# Patient Record
Sex: Female | Born: 1935
Health system: Southern US, Community
[De-identification: ages and names within clinical notes are randomized; demographics above are authoritative.]

## PROBLEM LIST (undated history)

## (undated) DIAGNOSIS — I82403 Acute embolism and thrombosis of unspecified deep veins of lower extremity, bilateral: Secondary | ICD-10-CM

## (undated) DIAGNOSIS — I2699 Other pulmonary embolism without acute cor pulmonale: Secondary | ICD-10-CM

## (undated) DIAGNOSIS — I4891 Unspecified atrial fibrillation: Secondary | ICD-10-CM

## (undated) DIAGNOSIS — E119 Type 2 diabetes mellitus without complications: Secondary | ICD-10-CM

## (undated) DIAGNOSIS — F323 Major depressive disorder, single episode, severe with psychotic features: Secondary | ICD-10-CM

## (undated) DIAGNOSIS — I4892 Unspecified atrial flutter: Secondary | ICD-10-CM

## (undated) DIAGNOSIS — F0390 Unspecified dementia without behavioral disturbance: Secondary | ICD-10-CM

## (undated) DIAGNOSIS — N189 Chronic kidney disease, unspecified: Secondary | ICD-10-CM

## (undated) DIAGNOSIS — I10 Essential (primary) hypertension: Secondary | ICD-10-CM

## (undated) DIAGNOSIS — F0150 Vascular dementia without behavioral disturbance: Secondary | ICD-10-CM

## (undated) HISTORY — PX: CHOLECYSTECTOMY: SHX55

## (undated) HISTORY — DX: Major depressive disorder, single episode, severe with psychotic features: F32.3

## (undated) HISTORY — DX: Unspecified dementia, unspecified severity, without behavioral disturbance, psychotic disturbance, mood disturbance, and anxiety: F03.90

---
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
## 2000-01-14 ENCOUNTER — Encounter (INDEPENDENT_AMBULATORY_CARE_PROVIDER_SITE_OTHER): Payer: Self-pay | Admitting: Specialist

## 2000-01-14 ENCOUNTER — Observation Stay (HOSPITAL_COMMUNITY): Admission: EM | Admit: 2000-01-14 | Discharge: 2000-01-15 | Payer: Self-pay

## 2000-01-14 ENCOUNTER — Encounter: Payer: Self-pay | Admitting: Emergency Medicine

## 2000-12-13 ENCOUNTER — Encounter (INDEPENDENT_AMBULATORY_CARE_PROVIDER_SITE_OTHER): Payer: Self-pay

## 2000-12-13 ENCOUNTER — Ambulatory Visit (HOSPITAL_COMMUNITY): Admission: RE | Admit: 2000-12-13 | Discharge: 2000-12-13 | Payer: Self-pay | Admitting: Gastroenterology

## 2001-06-06 ENCOUNTER — Other Ambulatory Visit: Admission: RE | Admit: 2001-06-06 | Discharge: 2001-06-06 | Payer: Self-pay | Admitting: Family Medicine

## 2004-05-03 ENCOUNTER — Ambulatory Visit (HOSPITAL_COMMUNITY): Admission: RE | Admit: 2004-05-03 | Discharge: 2004-05-03 | Payer: Self-pay | Admitting: Gastroenterology

## 2004-05-03 ENCOUNTER — Encounter (INDEPENDENT_AMBULATORY_CARE_PROVIDER_SITE_OTHER): Payer: Self-pay | Admitting: Specialist

## 2004-06-22 ENCOUNTER — Other Ambulatory Visit: Admission: RE | Admit: 2004-06-22 | Discharge: 2004-06-22 | Payer: Self-pay | Admitting: Family Medicine

## 2006-08-08 ENCOUNTER — Other Ambulatory Visit: Admission: RE | Admit: 2006-08-08 | Discharge: 2006-08-08 | Payer: Self-pay | Admitting: Family Medicine

## 2008-08-17 ENCOUNTER — Other Ambulatory Visit: Admission: RE | Admit: 2008-08-17 | Discharge: 2008-08-17 | Payer: Self-pay | Admitting: Family Medicine

## 2011-05-05 NOTE — Op Note (Signed)
NAME:  Catherine Gonzales, Catherine Gonzales                         ACCOUNT NO.:  [id number]   MEDICAL RECORD NO.:  [id number]                   PATIENT TYPE:  AMB   LOCATION:  ENDO                                 FACILITY:  [facility]   PHYSICIAN:  Petra Kuba, M.D.                 DATE OF BIRTH:  1934-02-05   DATE OF PROCEDURE:  DATE OF DISCHARGE:  05/03/2004                                 OPERATIVE REPORT   PROCEDURE:  Colonoscopy with polypectomy.   INDICATION:  Family history of colon cancer.  Person history of colon  polyps.  Due for repeat screening.  Consent was signed after risks,  benefits, methods, options thoroughly discussed in the office.   MEDICINES USED:  1. Demerol 90.  2. Versed 9.   DESCRIPTION OF PROCEDURE:  Rectal inspection was pertinent for external  hemorrhoids, small.  Digital exam was negative.  The video colonoscope was  inserted and with some difficulty due to a tortuous colon, was able to be  advanced to the cecum.  This did require rolling her on her back and various  abdominal pressures.  On insertion, in the mid transverse, a small polyp was  seen which was cold biopsied on insertion.  No other abnormalities were  seen, but there were left-sided diverticula.  The cecum was identified by  the appendiceal orifice and the ileocecal valve.  A tiny polyp was seen just  by the __________ which was cold biopsied x 1 and put in the same container.  The scope was further withdrawn.  In the proximal level of the hepatic  flexure, a tiny to small polyp was seen and was hot biopsied x 1 and put in  the same container.  The scope was withdrawn back to the polyp seen on  insertion, and we went ahead and snared that, electrocautery applied.  The  polyp was suctioned through the scope and collected in the trap and put in  the same container.  The scope was further withdrawn.  Another distal tiny  transverse polyp was seen and was hot biopsied x 1.  The scope was slowly  withdrawn.  The  prep was adequate.  There was some liquid stool that  required washing and suctioning.  On slow withdrawal through the colon,  however, no additional findings were seen but the early left-sided  diverticula.  Anorectal pull-through and retroflexion confirmed some small  hemorrhoids.  The scope was reinserted a short ways up the left side of the  colon; air was suctioned and scope removed.  The patient tolerated the  procedure well.  There was no obvious immediate complication.   ENDOSCOPIC DIAGNOSES:  1. Internal-external hemorrhoids.  2. Left-sided early diverticula.  3. Cecal tiny polyp cold biopsy.  4. Hepatic flexure and distal transverse small polyps hot biopsied.  5. Small transverse polyp, cold biopsied on insertion, snared on withdrawal.  6. Otherwise, within normal limits to  the cecum.   PLAN:  1. Await pathology to determine future colonic screening.  2. Happy to see back p.r.n.  3. Otherwise, return care to Dr. Arvilla Market for the customary health care     maintenance to include yearly rectals and guaiacs.                                               Petra Kuba, M.D.    MEM/MEDQ  D:  05/03/2004  T:  05/03/2004  Job:  [id number]   cc:   Donia Guiles, M.D.  [address] [place]  [place] [address]  Fax: [phone]

## 2011-05-05 NOTE — Procedures (Signed)
[facility]  Patient:    Catherine Gonzales, Catherine Gonzales                        MRN: [id number] Proc. Date: 12/13/00 Attending:  Petra Kuba, M.D.                           Procedure Report  PROCEDURE:  Colonoscopy and polypectomy.  ENDOSCOPIST:  Petra Kuba, M.D.  INDICATIONS:  Family history of colon cancer, due for colonic screening.  INFORMED CONSENT:  Consent was signed after risks, benefits, methods and options were thoroughly discussed in the office.  MEDICATIONS USED:  Demerol 100 mg, Versed 9 mg.  DESCRIPTION OF PROCEDURE:  Rectal examination was pertinent for external hemorrhoids.  Digital exam was negative.  The video colonoscope was inserted and fairly easily advanced around the colon to the cecum.  This did require some abdominal pressure but no position changes.  The cecum was identified by the appendiceal orifice and the ileocecal valve.  No significant abnormality was seen on insertion.  The scope was slowly withdrawn.  The prep was adequate.  There was some liquid stool that required washing and suctioning. Upon slow withdrawal through the colon, the cecum was normal.  In the mid-descending to the hepatic flexure, a 4 mm polyp was seen, and snare electrocautery applied and the polyp was suctioned through the scope and collected in the container.  There was another 2 mm polyp just proximal to this which was hot biopsied x 1 and put in the same container.  The scope was further withdrawn.  The sigmoid was tortuous.  There were two tiny proximal sigmoid polyps seen.  One was tiny and cold biopsied.  One was hot biopsied. We decided based on their tiny appearance and non-worrisome appearance to put them with the other polyps.  The scope was further withdrawn.  No other abnormalities were seen as we withdrew back to the rectum except for some hemorrhoids.  Retroflexion confirmed the hemorrhoids.  The scope was inserted a short way up the sigmoid.  Air  was removed.  The scope removed.  The patient tolerated the procedure well.  There were no obvious immediate complications.  ENDOSCOPIC DIAGNOSES: 1. Internal and external hemorrhoids. 2. Tortuous sigmoid. 3. Four polyps, one hot, one cold in the proximal sigmoid, one snared    and one hot in the ascending and hepatic flexure. 4. Otherwise within normal limits to the cecum.  PLAN: 1. Await pathology to determine future colonic screening. 2. Otherwise routine post polypectomy instructions. 3. Yearly rectal and guaiacs by Dr. Arvilla Market. 4. I would be happy to see back sooner p.r.n. DD:  12/13/00 TD:  12/13/00 Job: [id number]

## 2012-03-21 ENCOUNTER — Other Ambulatory Visit: Payer: Self-pay | Admitting: Family Medicine

## 2012-03-21 ENCOUNTER — Ambulatory Visit
Admission: RE | Admit: 2012-03-21 | Discharge: 2012-03-21 | Disposition: A | Discharge status: Home / Self Care | Payer: Medicare Other | Source: Ambulatory Visit | Attending: Family Medicine | Admitting: Family Medicine

## 2012-03-21 DIAGNOSIS — M25569 Pain in unspecified knee: Secondary | ICD-10-CM | POA: Diagnosis not present

## 2012-03-21 DIAGNOSIS — E119 Type 2 diabetes mellitus without complications: Secondary | ICD-10-CM | POA: Diagnosis not present

## 2012-03-21 DIAGNOSIS — M1710 Unilateral primary osteoarthritis, unspecified knee: Secondary | ICD-10-CM | POA: Diagnosis not present

## 2012-03-21 DIAGNOSIS — E782 Mixed hyperlipidemia: Secondary | ICD-10-CM | POA: Diagnosis not present

## 2012-03-21 DIAGNOSIS — I10 Essential (primary) hypertension: Secondary | ICD-10-CM | POA: Diagnosis not present

## 2012-04-30 DIAGNOSIS — H2510 Age-related nuclear cataract, unspecified eye: Secondary | ICD-10-CM | POA: Diagnosis not present

## 2012-04-30 DIAGNOSIS — H40019 Open angle with borderline findings, low risk, unspecified eye: Secondary | ICD-10-CM | POA: Diagnosis not present

## 2012-08-07 ENCOUNTER — Other Ambulatory Visit: Payer: Self-pay | Admitting: Gastroenterology

## 2012-08-07 DIAGNOSIS — K5730 Diverticulosis of large intestine without perforation or abscess without bleeding: Secondary | ICD-10-CM | POA: Diagnosis not present

## 2012-08-07 DIAGNOSIS — Z86010 Personal history of colonic polyps: Secondary | ICD-10-CM | POA: Diagnosis not present

## 2012-08-07 DIAGNOSIS — Z09 Encounter for follow-up examination after completed treatment for conditions other than malignant neoplasm: Secondary | ICD-10-CM | POA: Diagnosis not present

## 2012-08-07 DIAGNOSIS — D126 Benign neoplasm of colon, unspecified: Secondary | ICD-10-CM | POA: Diagnosis not present

## 2012-08-07 DIAGNOSIS — Z800 Family history of malignant neoplasm of digestive organs: Secondary | ICD-10-CM | POA: Diagnosis not present

## 2012-09-26 DIAGNOSIS — Z1231 Encounter for screening mammogram for malignant neoplasm of breast: Secondary | ICD-10-CM | POA: Diagnosis not present

## 2012-10-30 DIAGNOSIS — E782 Mixed hyperlipidemia: Secondary | ICD-10-CM | POA: Diagnosis not present

## 2012-10-30 DIAGNOSIS — Z0000 Encounter for general adult medical examination without abnormal findings: Secondary | ICD-10-CM | POA: Diagnosis not present

## 2012-10-30 DIAGNOSIS — I10 Essential (primary) hypertension: Secondary | ICD-10-CM | POA: Diagnosis not present

## 2012-11-19 DIAGNOSIS — L820 Inflamed seborrheic keratosis: Secondary | ICD-10-CM | POA: Diagnosis not present

## 2012-11-19 DIAGNOSIS — L570 Actinic keratosis: Secondary | ICD-10-CM | POA: Diagnosis not present

## 2013-01-27 DIAGNOSIS — M79609 Pain in unspecified limb: Secondary | ICD-10-CM | POA: Diagnosis not present

## 2013-01-27 DIAGNOSIS — M1710 Unilateral primary osteoarthritis, unspecified knee: Secondary | ICD-10-CM | POA: Diagnosis not present

## 2013-02-28 DIAGNOSIS — M1710 Unilateral primary osteoarthritis, unspecified knee: Secondary | ICD-10-CM | POA: Diagnosis not present

## 2013-04-24 DIAGNOSIS — I10 Essential (primary) hypertension: Secondary | ICD-10-CM | POA: Diagnosis not present

## 2013-04-24 DIAGNOSIS — E119 Type 2 diabetes mellitus without complications: Secondary | ICD-10-CM | POA: Diagnosis not present

## 2013-04-24 DIAGNOSIS — Z6839 Body mass index (BMI) 39.0-39.9, adult: Secondary | ICD-10-CM | POA: Diagnosis not present

## 2013-04-24 DIAGNOSIS — R05 Cough: Secondary | ICD-10-CM | POA: Diagnosis not present

## 2013-04-24 DIAGNOSIS — E782 Mixed hyperlipidemia: Secondary | ICD-10-CM | POA: Diagnosis not present

## 2013-05-01 DIAGNOSIS — H40019 Open angle with borderline findings, low risk, unspecified eye: Secondary | ICD-10-CM | POA: Diagnosis not present

## 2013-05-01 DIAGNOSIS — H2510 Age-related nuclear cataract, unspecified eye: Secondary | ICD-10-CM | POA: Diagnosis not present

## 2013-08-25 IMAGING — CR DG KNEE 1-2V*L*
2 series · 2 of 2 positions shown · non-contrast
Comparison: None.

CLINICAL DATA: Left lateral knee pain, no injury

LEFT KNEE - 1-2 VIEW

[view not recorded (1 of 2)]
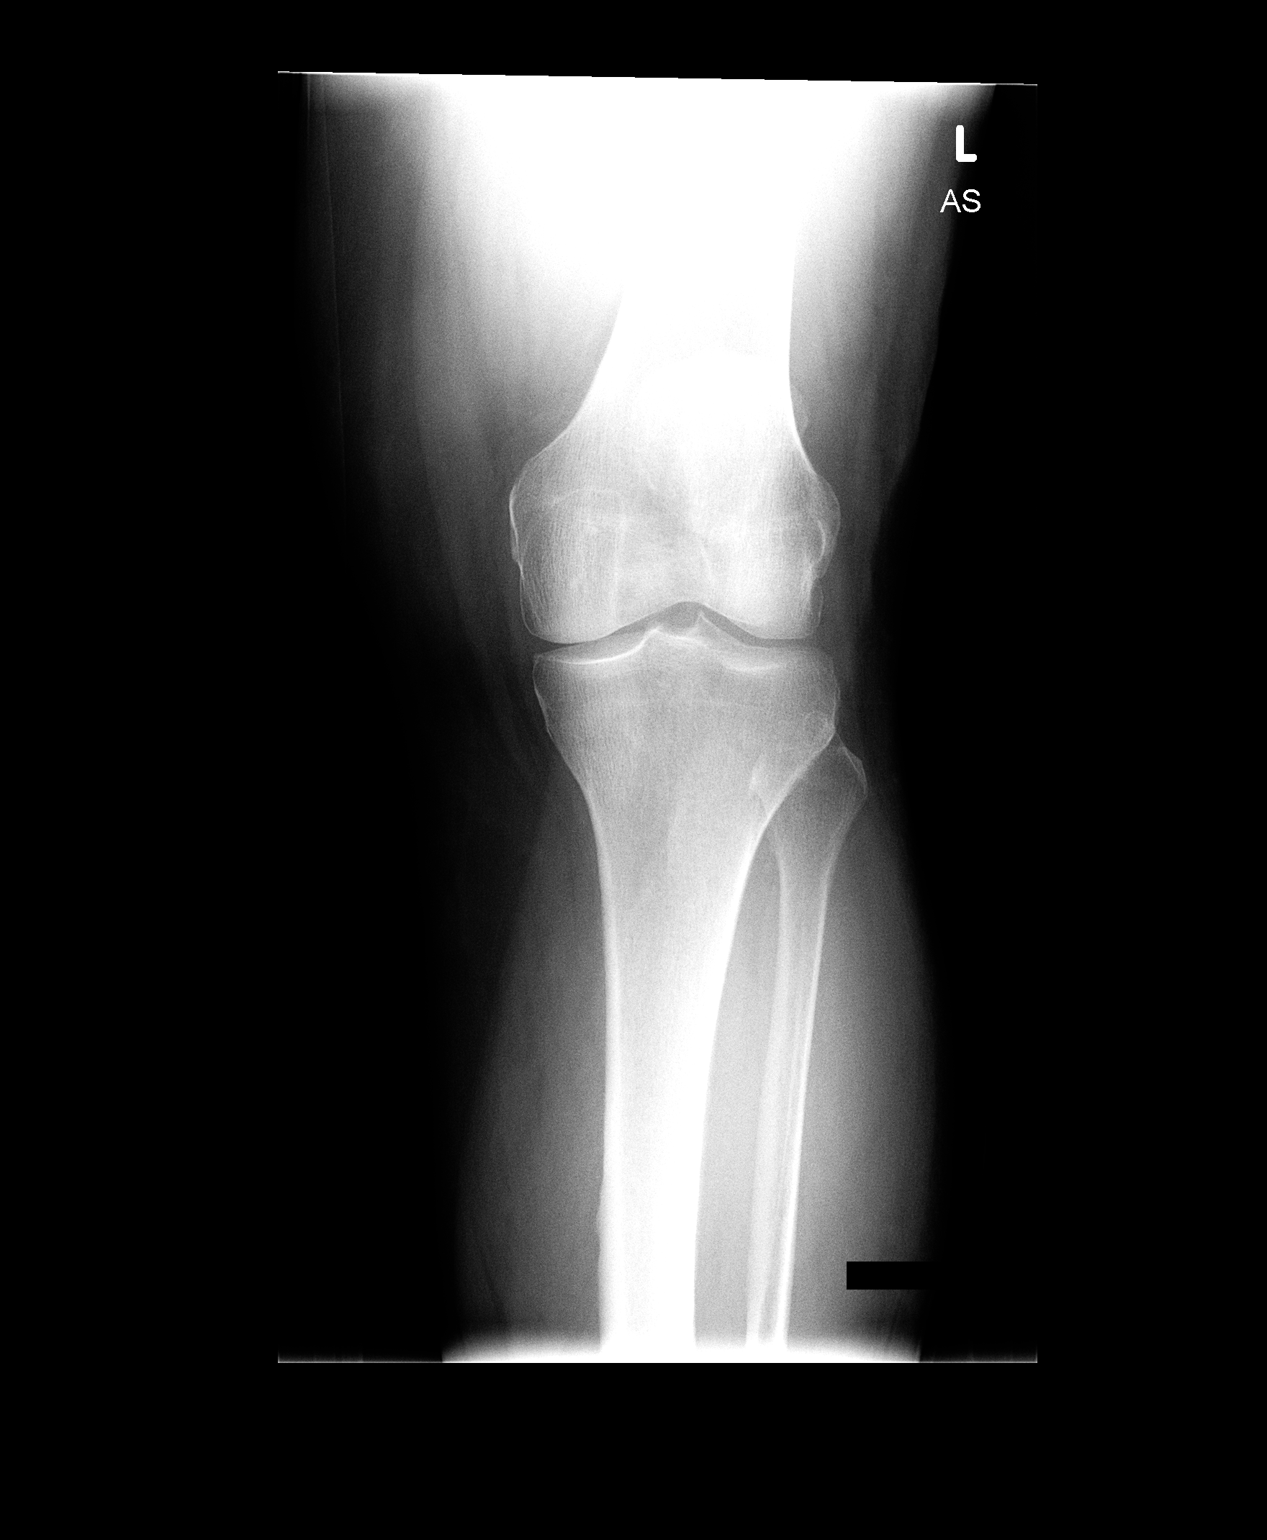

[view not recorded (2 of 2)]
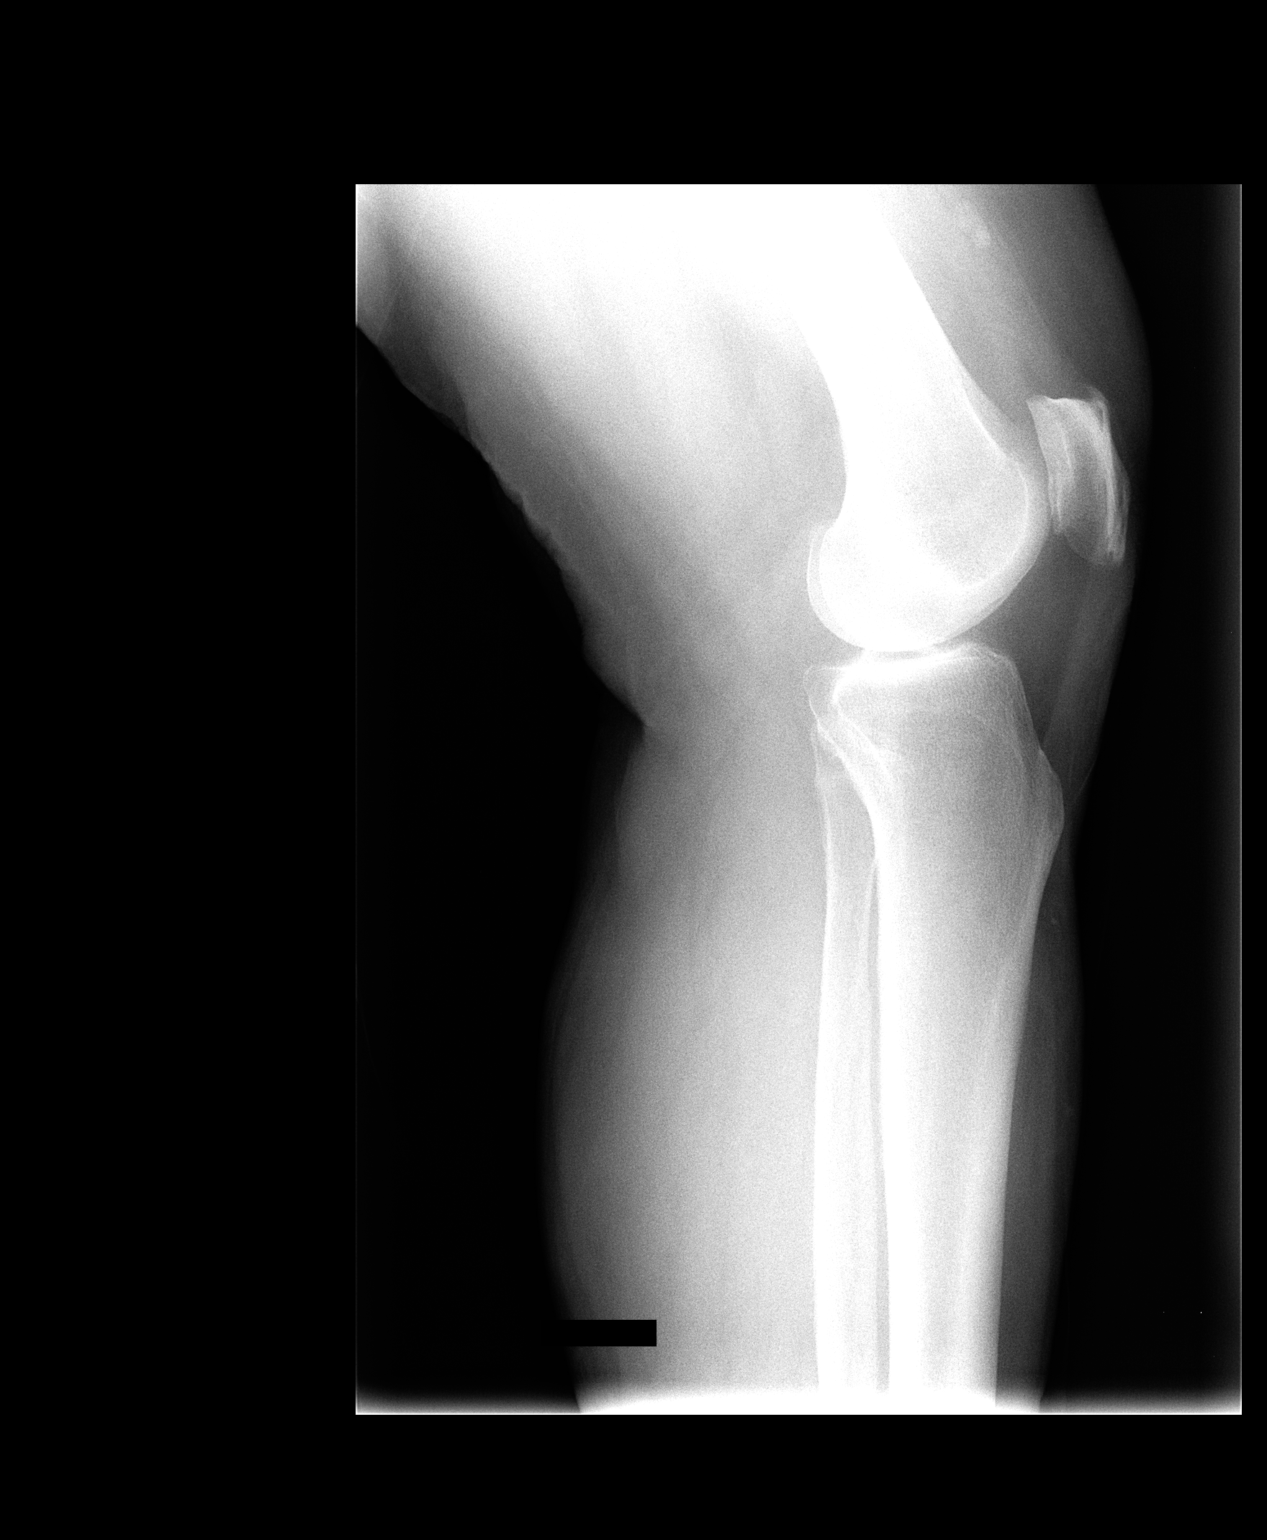

[2 of 2 positions shown; findings below may reference images not displayed]

FINDINGS: The knee joint spaces are relatively normal for age with
only slight narrowing medially.  No acute fracture is seen.  No
joint effusion is noted.
IMPRESSION: Minimal degenerative change for age.  No acute abnormality.

## 2013-10-17 DIAGNOSIS — Z1231 Encounter for screening mammogram for malignant neoplasm of breast: Secondary | ICD-10-CM | POA: Diagnosis not present

## 2013-10-23 DIAGNOSIS — R928 Other abnormal and inconclusive findings on diagnostic imaging of breast: Secondary | ICD-10-CM | POA: Diagnosis not present

## 2014-02-27 DIAGNOSIS — Z23 Encounter for immunization: Secondary | ICD-10-CM | POA: Diagnosis not present

## 2014-02-27 DIAGNOSIS — Z6838 Body mass index (BMI) 38.0-38.9, adult: Secondary | ICD-10-CM | POA: Diagnosis not present

## 2014-02-27 DIAGNOSIS — E782 Mixed hyperlipidemia: Secondary | ICD-10-CM | POA: Diagnosis not present

## 2014-02-27 DIAGNOSIS — Z0000 Encounter for general adult medical examination without abnormal findings: Secondary | ICD-10-CM | POA: Diagnosis not present

## 2014-02-27 DIAGNOSIS — E1129 Type 2 diabetes mellitus with other diabetic kidney complication: Secondary | ICD-10-CM | POA: Diagnosis not present

## 2014-02-27 DIAGNOSIS — Z1331 Encounter for screening for depression: Secondary | ICD-10-CM | POA: Diagnosis not present

## 2014-02-27 DIAGNOSIS — N183 Chronic kidney disease, stage 3 unspecified: Secondary | ICD-10-CM | POA: Diagnosis not present

## 2014-02-27 DIAGNOSIS — I129 Hypertensive chronic kidney disease with stage 1 through stage 4 chronic kidney disease, or unspecified chronic kidney disease: Secondary | ICD-10-CM | POA: Diagnosis not present

## 2014-05-05 DIAGNOSIS — E119 Type 2 diabetes mellitus without complications: Secondary | ICD-10-CM | POA: Diagnosis not present

## 2014-05-05 DIAGNOSIS — H40019 Open angle with borderline findings, low risk, unspecified eye: Secondary | ICD-10-CM | POA: Diagnosis not present

## 2014-05-05 DIAGNOSIS — H2510 Age-related nuclear cataract, unspecified eye: Secondary | ICD-10-CM | POA: Diagnosis not present

## 2014-05-05 DIAGNOSIS — H4011X0 Primary open-angle glaucoma, stage unspecified: Secondary | ICD-10-CM | POA: Diagnosis not present

## 2014-05-05 DIAGNOSIS — H02839 Dermatochalasis of unspecified eye, unspecified eyelid: Secondary | ICD-10-CM | POA: Diagnosis not present

## 2014-06-10 DIAGNOSIS — M1710 Unilateral primary osteoarthritis, unspecified knee: Secondary | ICD-10-CM | POA: Diagnosis not present

## 2014-09-02 DIAGNOSIS — Z23 Encounter for immunization: Secondary | ICD-10-CM | POA: Diagnosis not present

## 2014-09-02 DIAGNOSIS — E119 Type 2 diabetes mellitus without complications: Secondary | ICD-10-CM | POA: Diagnosis not present

## 2014-09-02 DIAGNOSIS — E782 Mixed hyperlipidemia: Secondary | ICD-10-CM | POA: Diagnosis not present

## 2014-09-02 DIAGNOSIS — I10 Essential (primary) hypertension: Secondary | ICD-10-CM | POA: Diagnosis not present

## 2014-09-02 DIAGNOSIS — Z6838 Body mass index (BMI) 38.0-38.9, adult: Secondary | ICD-10-CM | POA: Diagnosis not present

## 2014-10-02 DIAGNOSIS — H40011 Open angle with borderline findings, low risk, right eye: Secondary | ICD-10-CM | POA: Diagnosis not present

## 2014-10-02 DIAGNOSIS — H2513 Age-related nuclear cataract, bilateral: Secondary | ICD-10-CM | POA: Diagnosis not present

## 2014-10-02 DIAGNOSIS — H02834 Dermatochalasis of left upper eyelid: Secondary | ICD-10-CM | POA: Diagnosis not present

## 2014-10-02 DIAGNOSIS — H4011X2 Primary open-angle glaucoma, moderate stage: Secondary | ICD-10-CM | POA: Diagnosis not present

## 2014-10-02 DIAGNOSIS — H02831 Dermatochalasis of right upper eyelid: Secondary | ICD-10-CM | POA: Diagnosis not present

## 2014-10-22 DIAGNOSIS — Z1231 Encounter for screening mammogram for malignant neoplasm of breast: Secondary | ICD-10-CM | POA: Diagnosis not present

## 2015-01-05 DIAGNOSIS — J069 Acute upper respiratory infection, unspecified: Secondary | ICD-10-CM | POA: Diagnosis not present

## 2015-03-10 DIAGNOSIS — Z6841 Body Mass Index (BMI) 40.0 and over, adult: Secondary | ICD-10-CM | POA: Diagnosis not present

## 2015-03-10 DIAGNOSIS — E782 Mixed hyperlipidemia: Secondary | ICD-10-CM | POA: Diagnosis not present

## 2015-03-10 DIAGNOSIS — I129 Hypertensive chronic kidney disease with stage 1 through stage 4 chronic kidney disease, or unspecified chronic kidney disease: Secondary | ICD-10-CM | POA: Diagnosis not present

## 2015-03-10 DIAGNOSIS — N183 Chronic kidney disease, stage 3 (moderate): Secondary | ICD-10-CM | POA: Diagnosis not present

## 2015-03-10 DIAGNOSIS — Z0001 Encounter for general adult medical examination with abnormal findings: Secondary | ICD-10-CM | POA: Diagnosis not present

## 2015-03-10 DIAGNOSIS — E1122 Type 2 diabetes mellitus with diabetic chronic kidney disease: Secondary | ICD-10-CM | POA: Diagnosis not present

## 2015-03-26 DIAGNOSIS — J209 Acute bronchitis, unspecified: Secondary | ICD-10-CM | POA: Diagnosis not present

## 2015-04-09 DIAGNOSIS — H02834 Dermatochalasis of left upper eyelid: Secondary | ICD-10-CM | POA: Diagnosis not present

## 2015-04-09 DIAGNOSIS — E119 Type 2 diabetes mellitus without complications: Secondary | ICD-10-CM | POA: Diagnosis not present

## 2015-04-09 DIAGNOSIS — H40011 Open angle with borderline findings, low risk, right eye: Secondary | ICD-10-CM | POA: Diagnosis not present

## 2015-04-09 DIAGNOSIS — H4011X2 Primary open-angle glaucoma, moderate stage: Secondary | ICD-10-CM | POA: Diagnosis not present

## 2015-04-09 DIAGNOSIS — H2513 Age-related nuclear cataract, bilateral: Secondary | ICD-10-CM | POA: Diagnosis not present

## 2015-04-09 DIAGNOSIS — H02831 Dermatochalasis of right upper eyelid: Secondary | ICD-10-CM | POA: Diagnosis not present

## 2015-06-15 DIAGNOSIS — N183 Chronic kidney disease, stage 3 (moderate): Secondary | ICD-10-CM | POA: Diagnosis not present

## 2015-06-15 DIAGNOSIS — Z6841 Body Mass Index (BMI) 40.0 and over, adult: Secondary | ICD-10-CM | POA: Diagnosis not present

## 2015-06-15 DIAGNOSIS — I129 Hypertensive chronic kidney disease with stage 1 through stage 4 chronic kidney disease, or unspecified chronic kidney disease: Secondary | ICD-10-CM | POA: Diagnosis not present

## 2015-06-15 DIAGNOSIS — E1122 Type 2 diabetes mellitus with diabetic chronic kidney disease: Secondary | ICD-10-CM | POA: Diagnosis not present

## 2015-06-15 DIAGNOSIS — E782 Mixed hyperlipidemia: Secondary | ICD-10-CM | POA: Diagnosis not present

## 2015-08-24 DIAGNOSIS — N183 Chronic kidney disease, stage 3 (moderate): Secondary | ICD-10-CM | POA: Diagnosis not present

## 2015-08-24 DIAGNOSIS — I129 Hypertensive chronic kidney disease with stage 1 through stage 4 chronic kidney disease, or unspecified chronic kidney disease: Secondary | ICD-10-CM | POA: Diagnosis not present

## 2015-09-16 DIAGNOSIS — Z23 Encounter for immunization: Secondary | ICD-10-CM | POA: Diagnosis not present

## 2015-09-16 DIAGNOSIS — Z6839 Body mass index (BMI) 39.0-39.9, adult: Secondary | ICD-10-CM | POA: Diagnosis not present

## 2015-09-16 DIAGNOSIS — E1122 Type 2 diabetes mellitus with diabetic chronic kidney disease: Secondary | ICD-10-CM | POA: Diagnosis not present

## 2015-09-16 DIAGNOSIS — N183 Chronic kidney disease, stage 3 (moderate): Secondary | ICD-10-CM | POA: Diagnosis not present

## 2015-09-16 DIAGNOSIS — E782 Mixed hyperlipidemia: Secondary | ICD-10-CM | POA: Diagnosis not present

## 2015-09-16 DIAGNOSIS — I129 Hypertensive chronic kidney disease with stage 1 through stage 4 chronic kidney disease, or unspecified chronic kidney disease: Secondary | ICD-10-CM | POA: Diagnosis not present

## 2015-10-13 DIAGNOSIS — H40051 Ocular hypertension, right eye: Secondary | ICD-10-CM | POA: Diagnosis not present

## 2015-10-13 DIAGNOSIS — H2513 Age-related nuclear cataract, bilateral: Secondary | ICD-10-CM | POA: Diagnosis not present

## 2015-10-13 DIAGNOSIS — H40011 Open angle with borderline findings, low risk, right eye: Secondary | ICD-10-CM | POA: Diagnosis not present

## 2015-10-13 DIAGNOSIS — H02831 Dermatochalasis of right upper eyelid: Secondary | ICD-10-CM | POA: Diagnosis not present

## 2015-10-13 DIAGNOSIS — H401122 Primary open-angle glaucoma, left eye, moderate stage: Secondary | ICD-10-CM | POA: Diagnosis not present

## 2015-10-13 DIAGNOSIS — H02834 Dermatochalasis of left upper eyelid: Secondary | ICD-10-CM | POA: Diagnosis not present

## 2015-12-15 DIAGNOSIS — Z1231 Encounter for screening mammogram for malignant neoplasm of breast: Secondary | ICD-10-CM | POA: Diagnosis not present

## 2016-01-19 DIAGNOSIS — N183 Chronic kidney disease, stage 3 (moderate): Secondary | ICD-10-CM | POA: Diagnosis not present

## 2016-01-19 DIAGNOSIS — E1122 Type 2 diabetes mellitus with diabetic chronic kidney disease: Secondary | ICD-10-CM | POA: Diagnosis not present

## 2016-01-19 DIAGNOSIS — Z6839 Body mass index (BMI) 39.0-39.9, adult: Secondary | ICD-10-CM | POA: Diagnosis not present

## 2016-01-19 DIAGNOSIS — E782 Mixed hyperlipidemia: Secondary | ICD-10-CM | POA: Diagnosis not present

## 2016-01-19 DIAGNOSIS — I129 Hypertensive chronic kidney disease with stage 1 through stage 4 chronic kidney disease, or unspecified chronic kidney disease: Secondary | ICD-10-CM | POA: Diagnosis not present

## 2016-01-19 DIAGNOSIS — Z7984 Long term (current) use of oral hypoglycemic drugs: Secondary | ICD-10-CM | POA: Diagnosis not present

## 2016-05-04 DIAGNOSIS — E1122 Type 2 diabetes mellitus with diabetic chronic kidney disease: Secondary | ICD-10-CM | POA: Diagnosis not present

## 2016-05-04 DIAGNOSIS — E782 Mixed hyperlipidemia: Secondary | ICD-10-CM | POA: Diagnosis not present

## 2016-05-04 DIAGNOSIS — N183 Chronic kidney disease, stage 3 (moderate): Secondary | ICD-10-CM | POA: Diagnosis not present

## 2016-05-04 DIAGNOSIS — Z6839 Body mass index (BMI) 39.0-39.9, adult: Secondary | ICD-10-CM | POA: Diagnosis not present

## 2016-05-04 DIAGNOSIS — I129 Hypertensive chronic kidney disease with stage 1 through stage 4 chronic kidney disease, or unspecified chronic kidney disease: Secondary | ICD-10-CM | POA: Diagnosis not present

## 2016-05-04 DIAGNOSIS — Z0000 Encounter for general adult medical examination without abnormal findings: Secondary | ICD-10-CM | POA: Diagnosis not present

## 2016-07-04 DIAGNOSIS — H02831 Dermatochalasis of right upper eyelid: Secondary | ICD-10-CM | POA: Diagnosis not present

## 2016-07-04 DIAGNOSIS — H40013 Open angle with borderline findings, low risk, bilateral: Secondary | ICD-10-CM | POA: Diagnosis not present

## 2016-07-04 DIAGNOSIS — H02834 Dermatochalasis of left upper eyelid: Secondary | ICD-10-CM | POA: Diagnosis not present

## 2016-07-04 DIAGNOSIS — E119 Type 2 diabetes mellitus without complications: Secondary | ICD-10-CM | POA: Diagnosis not present

## 2016-07-04 DIAGNOSIS — H25811 Combined forms of age-related cataract, right eye: Secondary | ICD-10-CM | POA: Diagnosis not present

## 2016-07-04 DIAGNOSIS — H25812 Combined forms of age-related cataract, left eye: Secondary | ICD-10-CM | POA: Diagnosis not present

## 2016-07-31 DIAGNOSIS — H25811 Combined forms of age-related cataract, right eye: Secondary | ICD-10-CM | POA: Diagnosis not present

## 2016-07-31 DIAGNOSIS — H2511 Age-related nuclear cataract, right eye: Secondary | ICD-10-CM | POA: Diagnosis not present

## 2016-10-10 DIAGNOSIS — F3341 Major depressive disorder, recurrent, in partial remission: Secondary | ICD-10-CM | POA: Diagnosis not present

## 2016-11-08 DIAGNOSIS — E782 Mixed hyperlipidemia: Secondary | ICD-10-CM | POA: Diagnosis not present

## 2016-11-08 DIAGNOSIS — N390 Urinary tract infection, site not specified: Secondary | ICD-10-CM | POA: Diagnosis not present

## 2016-11-08 DIAGNOSIS — E1122 Type 2 diabetes mellitus with diabetic chronic kidney disease: Secondary | ICD-10-CM | POA: Diagnosis not present

## 2016-11-08 DIAGNOSIS — Z23 Encounter for immunization: Secondary | ICD-10-CM | POA: Diagnosis not present

## 2016-11-08 DIAGNOSIS — I129 Hypertensive chronic kidney disease with stage 1 through stage 4 chronic kidney disease, or unspecified chronic kidney disease: Secondary | ICD-10-CM | POA: Diagnosis not present

## 2016-11-08 DIAGNOSIS — N183 Chronic kidney disease, stage 3 (moderate): Secondary | ICD-10-CM | POA: Diagnosis not present

## 2016-12-12 DIAGNOSIS — J069 Acute upper respiratory infection, unspecified: Secondary | ICD-10-CM | POA: Diagnosis not present

## 2016-12-16 DIAGNOSIS — Z79899 Other long term (current) drug therapy: Secondary | ICD-10-CM | POA: Diagnosis not present

## 2016-12-27 ENCOUNTER — Ambulatory Visit
Admission: RE | Admit: 2016-12-27 | Discharge: 2016-12-27 | Disposition: A | Discharge status: Home / Self Care | Payer: Medicare Other | Source: Ambulatory Visit | Attending: Family Medicine | Admitting: Family Medicine

## 2016-12-27 ENCOUNTER — Other Ambulatory Visit: Payer: Self-pay | Admitting: Family Medicine

## 2016-12-27 DIAGNOSIS — R413 Other amnesia: Secondary | ICD-10-CM | POA: Diagnosis not present

## 2016-12-27 DIAGNOSIS — J9691 Respiratory failure, unspecified with hypoxia: Secondary | ICD-10-CM

## 2016-12-27 DIAGNOSIS — R0602 Shortness of breath: Secondary | ICD-10-CM | POA: Diagnosis not present

## 2016-12-27 DIAGNOSIS — F3341 Major depressive disorder, recurrent, in partial remission: Secondary | ICD-10-CM | POA: Diagnosis not present

## 2016-12-27 DIAGNOSIS — R05 Cough: Secondary | ICD-10-CM | POA: Diagnosis not present

## 2017-01-05 DIAGNOSIS — R7989 Other specified abnormal findings of blood chemistry: Secondary | ICD-10-CM | POA: Diagnosis not present

## 2017-01-05 DIAGNOSIS — E538 Deficiency of other specified B group vitamins: Secondary | ICD-10-CM | POA: Diagnosis not present

## 2017-01-05 DIAGNOSIS — R945 Abnormal results of liver function studies: Secondary | ICD-10-CM | POA: Diagnosis not present

## 2017-01-13 ENCOUNTER — Encounter (HOSPITAL_COMMUNITY): Payer: Self-pay | Admitting: Family Medicine

## 2017-01-13 ENCOUNTER — Inpatient Hospital Stay (HOSPITAL_COMMUNITY): Payer: Medicare Other

## 2017-01-13 ENCOUNTER — Inpatient Hospital Stay (HOSPITAL_COMMUNITY)
Admission: EM | Admit: 2017-01-13 | Discharge: 2017-01-20 | DRG: 175 | Disposition: A | Discharge status: Home / Self Care | Payer: Medicare Other | Attending: Internal Medicine | Admitting: Internal Medicine

## 2017-01-13 ENCOUNTER — Emergency Department (HOSPITAL_COMMUNITY): Payer: Medicare Other

## 2017-01-13 DIAGNOSIS — A419 Sepsis, unspecified organism: Secondary | ICD-10-CM

## 2017-01-13 DIAGNOSIS — E876 Hypokalemia: Secondary | ICD-10-CM | POA: Diagnosis not present

## 2017-01-13 DIAGNOSIS — N179 Acute kidney failure, unspecified: Secondary | ICD-10-CM | POA: Diagnosis not present

## 2017-01-13 DIAGNOSIS — N183 Chronic kidney disease, stage 3 (moderate): Secondary | ICD-10-CM | POA: Diagnosis present

## 2017-01-13 DIAGNOSIS — I483 Typical atrial flutter: Secondary | ICD-10-CM | POA: Diagnosis not present

## 2017-01-13 DIAGNOSIS — N289 Disorder of kidney and ureter, unspecified: Secondary | ICD-10-CM

## 2017-01-13 DIAGNOSIS — J9601 Acute respiratory failure with hypoxia: Secondary | ICD-10-CM | POA: Diagnosis not present

## 2017-01-13 DIAGNOSIS — R739 Hyperglycemia, unspecified: Secondary | ICD-10-CM

## 2017-01-13 DIAGNOSIS — E872 Acidosis, unspecified: Secondary | ICD-10-CM

## 2017-01-13 DIAGNOSIS — E119 Type 2 diabetes mellitus without complications: Secondary | ICD-10-CM

## 2017-01-13 DIAGNOSIS — R413 Other amnesia: Secondary | ICD-10-CM | POA: Diagnosis not present

## 2017-01-13 DIAGNOSIS — I959 Hypotension, unspecified: Secondary | ICD-10-CM | POA: Diagnosis not present

## 2017-01-13 DIAGNOSIS — E1165 Type 2 diabetes mellitus with hyperglycemia: Secondary | ICD-10-CM | POA: Diagnosis present

## 2017-01-13 DIAGNOSIS — R6510 Systemic inflammatory response syndrome (SIRS) of non-infectious origin without acute organ dysfunction: Secondary | ICD-10-CM | POA: Diagnosis present

## 2017-01-13 DIAGNOSIS — I480 Paroxysmal atrial fibrillation: Secondary | ICD-10-CM | POA: Diagnosis present

## 2017-01-13 DIAGNOSIS — R55 Syncope and collapse: Secondary | ICD-10-CM | POA: Diagnosis not present

## 2017-01-13 DIAGNOSIS — I82403 Acute embolism and thrombosis of unspecified deep veins of lower extremity, bilateral: Secondary | ICD-10-CM | POA: Diagnosis present

## 2017-01-13 DIAGNOSIS — R262 Difficulty in walking, not elsewhere classified: Secondary | ICD-10-CM

## 2017-01-13 DIAGNOSIS — I130 Hypertensive heart and chronic kidney disease with heart failure and stage 1 through stage 4 chronic kidney disease, or unspecified chronic kidney disease: Secondary | ICD-10-CM | POA: Diagnosis present

## 2017-01-13 DIAGNOSIS — Z7982 Long term (current) use of aspirin: Secondary | ICD-10-CM

## 2017-01-13 DIAGNOSIS — R0600 Dyspnea, unspecified: Secondary | ICD-10-CM

## 2017-01-13 DIAGNOSIS — I2699 Other pulmonary embolism without acute cor pulmonale: Secondary | ICD-10-CM

## 2017-01-13 DIAGNOSIS — J810 Acute pulmonary edema: Secondary | ICD-10-CM | POA: Diagnosis not present

## 2017-01-13 DIAGNOSIS — I2609 Other pulmonary embolism with acute cor pulmonale: Secondary | ICD-10-CM | POA: Diagnosis not present

## 2017-01-13 DIAGNOSIS — E1122 Type 2 diabetes mellitus with diabetic chronic kidney disease: Secondary | ICD-10-CM | POA: Diagnosis not present

## 2017-01-13 DIAGNOSIS — Z79899 Other long term (current) drug therapy: Secondary | ICD-10-CM | POA: Diagnosis not present

## 2017-01-13 DIAGNOSIS — Z7984 Long term (current) use of oral hypoglycemic drugs: Secondary | ICD-10-CM

## 2017-01-13 DIAGNOSIS — I5033 Acute on chronic diastolic (congestive) heart failure: Secondary | ICD-10-CM | POA: Diagnosis not present

## 2017-01-13 DIAGNOSIS — I82493 Acute embolism and thrombosis of other specified deep vein of lower extremity, bilateral: Secondary | ICD-10-CM | POA: Diagnosis present

## 2017-01-13 DIAGNOSIS — R0902 Hypoxemia: Secondary | ICD-10-CM | POA: Diagnosis present

## 2017-01-13 DIAGNOSIS — I10 Essential (primary) hypertension: Secondary | ICD-10-CM | POA: Diagnosis present

## 2017-01-13 DIAGNOSIS — I471 Supraventricular tachycardia: Secondary | ICD-10-CM | POA: Diagnosis not present

## 2017-01-13 DIAGNOSIS — F0391 Unspecified dementia with behavioral disturbance: Secondary | ICD-10-CM | POA: Diagnosis present

## 2017-01-13 DIAGNOSIS — R0602 Shortness of breath: Secondary | ICD-10-CM | POA: Diagnosis not present

## 2017-01-13 DIAGNOSIS — I4892 Unspecified atrial flutter: Secondary | ICD-10-CM | POA: Diagnosis present

## 2017-01-13 DIAGNOSIS — E785 Hyperlipidemia, unspecified: Secondary | ICD-10-CM | POA: Diagnosis present

## 2017-01-13 DIAGNOSIS — D72829 Elevated white blood cell count, unspecified: Secondary | ICD-10-CM | POA: Diagnosis not present

## 2017-01-13 DIAGNOSIS — I824Y3 Acute embolism and thrombosis of unspecified deep veins of proximal lower extremity, bilateral: Secondary | ICD-10-CM | POA: Diagnosis not present

## 2017-01-13 HISTORY — DX: Type 2 diabetes mellitus without complications: E11.9

## 2017-01-13 HISTORY — DX: Chronic kidney disease, unspecified: N18.9

## 2017-01-13 HISTORY — DX: Essential (primary) hypertension: I10

## 2017-01-13 HISTORY — DX: Other pulmonary embolism without acute cor pulmonale: I26.99

## 2017-01-13 HISTORY — DX: Unspecified dementia without behavioral disturbance: F03.90

## 2017-01-13 LAB — CBC WITH DIFFERENTIAL/PLATELET
BASOS ABS: 0 10*3/uL (ref 0.0–0.1)
Basophils Relative: 0 %
Eosinophils Absolute: 0.1 10*3/uL (ref 0.0–0.7)
Eosinophils Relative: 1 %
HEMATOCRIT: 46 % (ref 36.0–46.0)
Hemoglobin: 14.7 g/dL (ref 12.0–15.0)
LYMPHS PCT: 20 %
Lymphs Abs: 2.7 10*3/uL (ref 0.7–4.0)
MCH: 32.6 pg (ref 26.0–34.0)
MCHC: 32 g/dL (ref 30.0–36.0)
MCV: 102 fL — AB (ref 78.0–100.0)
Monocytes Absolute: 0.8 10*3/uL (ref 0.1–1.0)
Monocytes Relative: 6 %
Neutro Abs: 10.2 10*3/uL — ABNORMAL HIGH (ref 1.7–7.7)
Neutrophils Relative %: 73 %
PLATELETS: 216 10*3/uL (ref 150–400)
RBC: 4.51 MIL/uL (ref 3.87–5.11)
RDW: 15.4 % (ref 11.5–15.5)
WBC: 13.9 10*3/uL — AB (ref 4.0–10.5)

## 2017-01-13 LAB — URINALYSIS, ROUTINE W REFLEX MICROSCOPIC
BILIRUBIN URINE: NEGATIVE
GLUCOSE, UA: NEGATIVE mg/dL
Hgb urine dipstick: NEGATIVE
Ketones, ur: NEGATIVE mg/dL
LEUKOCYTES UA: NEGATIVE
NITRITE: NEGATIVE
Protein, ur: 100 mg/dL — AB
SPECIFIC GRAVITY, URINE: 1.027 (ref 1.005–1.030)
pH: 5 (ref 5.0–8.0)

## 2017-01-13 LAB — COMPREHENSIVE METABOLIC PANEL
ALBUMIN: 3.7 g/dL (ref 3.5–5.0)
ALT: 48 U/L (ref 14–54)
AST: 63 U/L — ABNORMAL HIGH (ref 15–41)
Alkaline Phosphatase: 117 U/L (ref 38–126)
Anion gap: 14 (ref 5–15)
BILIRUBIN TOTAL: 0.4 mg/dL (ref 0.3–1.2)
BUN: 29 mg/dL — ABNORMAL HIGH (ref 6–20)
CO2: 22 mmol/L (ref 22–32)
Calcium: 9.2 mg/dL (ref 8.9–10.3)
Chloride: 104 mmol/L (ref 101–111)
Creatinine, Ser: 1.39 mg/dL — ABNORMAL HIGH (ref 0.44–1.00)
GFR calc Af Amer: 40 mL/min — ABNORMAL LOW (ref >60)
GFR, EST NON AFRICAN AMERICAN: 35 mL/min — AB (ref >60)
Glucose, Bld: 235 mg/dL — ABNORMAL HIGH (ref 65–99)
POTASSIUM: 4.6 mmol/L (ref 3.5–5.1)
Sodium: 140 mmol/L (ref 135–145)
TOTAL PROTEIN: 6.8 g/dL (ref 6.5–8.1)

## 2017-01-13 LAB — I-STAT ARTERIAL BLOOD GAS, ED
Acid-base deficit: 3 mmol/L — ABNORMAL HIGH (ref 0.0–2.0)
BICARBONATE: 23.4 mmol/L (ref 20.0–28.0)
O2 SAT: 98 %
PCO2 ART: 43.2 mmHg (ref 32.0–48.0)
Patient temperature: 97.6
TCO2: 25 mmol/L (ref 0–100)
pH, Arterial: 7.339 — ABNORMAL LOW (ref 7.350–7.450)
pO2, Arterial: 108 mmHg (ref 83.0–108.0)

## 2017-01-13 LAB — I-STAT TROPONIN, ED: Troponin i, poc: 0 ng/mL (ref 0.00–0.08)

## 2017-01-13 LAB — BRAIN NATRIURETIC PEPTIDE: B NATRIURETIC PEPTIDE 5: 287.2 pg/mL — AB (ref 0.0–100.0)

## 2017-01-13 LAB — TROPONIN I: Troponin I: <0.03 ng/mL (ref <0.03)

## 2017-01-13 LAB — I-STAT CG4 LACTIC ACID, ED: LACTIC ACID, VENOUS: 3.75 mmol/L — AB (ref 0.5–1.9)

## 2017-01-13 MED ORDER — IOPAMIDOL (ISOVUE-370) INJECTION 76%
INTRAVENOUS | Status: AC
  Administered 2017-01-13: 100 mL
  Filled 2017-01-13: qty 100

## 2017-01-13 MED ORDER — PIPERACILLIN-TAZOBACTAM 3.375 G IVPB 30 MIN
3.3750 g | Freq: Once | INTRAVENOUS | Status: AC
  Administered 2017-01-13: 3.375 g via INTRAVENOUS
  Filled 2017-01-13: qty 50

## 2017-01-13 MED ORDER — VANCOMYCIN HCL IN DEXTROSE 750-5 MG/150ML-% IV SOLN
750.0000 mg | Freq: Two times a day (BID) | INTRAVENOUS | Status: DC
  Filled 2017-01-13: qty 150

## 2017-01-13 MED ORDER — PIPERACILLIN-TAZOBACTAM 3.375 G IVPB
3.3750 g | Freq: Three times a day (TID) | INTRAVENOUS | Status: DC
  Filled 2017-01-13: qty 50

## 2017-01-13 MED ORDER — VANCOMYCIN HCL IN DEXTROSE 1-5 GM/200ML-% IV SOLN
1000.0000 mg | Freq: Once | INTRAVENOUS | Status: AC
  Administered 2017-01-13: 1000 mg via INTRAVENOUS
  Filled 2017-01-13: qty 200

## 2017-01-13 MED ORDER — SODIUM CHLORIDE 0.9 % IV BOLUS (SEPSIS)
1000.0000 mL | Freq: Once | INTRAVENOUS | Status: AC
  Administered 2017-01-13: 1000 mL via INTRAVENOUS

## 2017-01-13 MED ORDER — SODIUM CHLORIDE 0.9 % IV BOLUS (SEPSIS)
1000.0000 mL | Freq: Once | INTRAVENOUS | Status: AC
Start: 2017-01-13 — End: 2017-01-14
  Administered 2017-01-13: 1000 mL via INTRAVENOUS

## 2017-01-13 NOTE — Progress Notes (Signed)
ANTIBIOTIC CONSULT NOTE - INITIAL  Pharmacy Consult for Vanco/Zosyn Indication: sepsis  No Known Allergies  Patient Measurements: Weight: 200 lb (90.7 kg) Adjusted Body Weight:    Vital Signs: Temp: 97.6 F (36.4 C) (01/27 1958) Temp Source: Oral (01/27 1958) BP: 127/44 (01/27 2145) Pulse Rate: 126 (01/27 2145) Intake/Output from previous day: No intake/output data recorded. Intake/Output from this shift: No intake/output data recorded.  Labs:  Recent Labs  01/13/17 2010  WBC 13.9*  HGB 14.7  PLT 216  CREATININE 1.39*   CrCl cannot be calculated (Unknown ideal weight.). No results for input(s): VANCOTROUGH, VANCOPEAK, VANCORANDOM, GENTTROUGH, GENTPEAK, GENTRANDOM, TOBRATROUGH, TOBRAPEAK, TOBRARND, AMIKACINPEAK, AMIKACINTROU, AMIKACIN in the last 72 hours.   Microbiology: No results found for this or any previous visit (from the past 720 hour(s)).  Medical History: No past medical history on file.  Assessment: 83 yo f presenting with SOB - recent treatment for PNA/bronchitis. Aflutter. Scr 1.39, pt states weight around 200lbs with NKDA.  Goal of Therapy:  Vancomycin trough level 15-20 mcg/ml  Plan:  Zosyn 3.375 gm iv x1 then 3.375g IV q8hr. Vancomycin 1 g x 1, then 750mg  IV q12h Height and weight  Catherine Gonzales, PharmD, BCPS Clinical Staff Pharmacist Pager [phone]  Catherine Gonzales 01/13/2017,9:57 PM

## 2017-01-13 NOTE — ED Provider Notes (Signed)
[facility] DEPT Provider Note  CSN: [id number] Arrival date & time: 01/13/17  1940  History   Chief Complaint Chief Complaint  Patient presents with  . Shortness of Breath   HPI Catherine Gonzales is a 83 y.o. female.  The history is provided by the patient, medical records and a relative. No language interpreter was used.  Illness  This is a new problem. The current episode started more than 2 days ago. The problem occurs constantly. The problem has been gradually worsening. Associated symptoms include shortness of breath. Pertinent negatives include no chest pain, no abdominal pain and no headaches. The symptoms are aggravated by walking and exertion. The symptoms are relieved by rest.    History reviewed. No pertinent past medical history.  Patient Active Problem List   Diagnosis Date Noted  . Memory deficit 01/14/2017  . Hypertension 01/14/2017  . Acute pulmonary embolism ([place]) 01/13/2017  . Kidney disease 01/13/2017  . Acute respiratory failure with hypoxia ([place]) 01/13/2017  . SIRS (systemic inflammatory response syndrome) ([place]) 01/13/2017   History reviewed. No pertinent surgical history.  OB History    No data available     Home Medications    Prior to Admission medications   Medication Sig Start Date End Date Taking? Authorizing Provider  aspirin 81 MG chewable tablet Chew 81 mg by mouth daily.   Yes Historical Provider, MD  ezetimibe-simvastatin (VYTORIN) 10-40 MG tablet Take 1 tablet by mouth daily.   Yes Historical Provider, MD  lisinopril (PRINIVIL,ZESTRIL) 40 MG tablet Take 40 mg by mouth daily.   Yes Historical Provider, MD  metoprolol succinate (TOPROL-XL) 100 MG 24 hr tablet Take 100 mg by mouth daily. Take with or immediately following a meal.   Yes Historical Provider, MD  nortriptyline (PAMELOR) 25 MG capsule Take 25 mg by mouth at bedtime.   Yes Historical Provider, MD  risperiDONE (RISPERDAL) 0.25 MG tablet Take 0.25 mg by mouth at bedtime.   Yes  Historical Provider, MD  SitaGLIPtin-MetFORMIN HCl (JANUMET XR) [phone] MG TB24 Take 1 tablet by mouth daily.   Yes Historical Provider, MD   Family History History reviewed. No pertinent family history.  Social History Social History  Substance Use Topics  . Smoking status: Not on file  . Smokeless tobacco: Not on file  . Alcohol use Not on file   Allergies   Patient has no known allergies.  Review of Systems Review of Systems  Constitutional: Positive for chills and fatigue.  Respiratory: Positive for cough and shortness of breath.   Cardiovascular: Negative for chest pain.  Gastrointestinal: Negative for abdominal pain.  Neurological: Positive for light-headedness. Negative for headaches.  All other systems reviewed and are negative.   Physical Exam Updated Vital Signs BP 112/78   Pulse (!) 125   Temp 97.6 F (36.4 C) (Oral)   Resp 23   Ht 5\' 3"  (1.6 m)   Wt 104.3 kg   SpO2 95%   BMI 40.74 kg/m   Physical Exam  Constitutional: She is oriented to person, place, and time. No distress.  Overweight elderly Caucasian female  HENT:  Head: Normocephalic and atraumatic.  Eyes: EOM are normal. Pupils are equal, round, and reactive to light.  Neck: Normal range of motion. Neck supple.  Cardiovascular: Regular rhythm and normal heart sounds.  Tachycardia present.   Pulmonary/Chest: She is in respiratory distress.  Tachypnea, increased work of breathing, hypoxia to 80s on room air with improvement to mid 90s on 4-5L [place], coarse breath sounds  at bases bilaterally worse on left  Abdominal: Soft. Bowel sounds are normal. She exhibits no distension. There is no tenderness.  Musculoskeletal: Normal range of motion.  Neurological: She is alert and oriented to person, place, and time.  Skin: Skin is warm and dry. Capillary refill takes less than 2 seconds. She is not diaphoretic.  Nursing note and vitals reviewed.   ED Treatments / Results  Labs (all labs ordered are listed,  but only abnormal results are displayed) Labs Reviewed  COMPREHENSIVE METABOLIC PANEL - Abnormal; Notable for the following:       Result Value   Glucose, Bld 235 (*)    BUN 29 (*)    Creatinine, Ser 1.39 (*)    AST 63 (*)    GFR calc non Af Amer 35 (*)    GFR calc Af Amer 40 (*)    All other components within normal limits  CBC WITH DIFFERENTIAL/PLATELET - Abnormal; Notable for the following:    WBC 13.9 (*)    MCV 102.0 (*)    Neutro Abs 10.2 (*)    All other components within normal limits  URINALYSIS, ROUTINE W REFLEX MICROSCOPIC - Abnormal; Notable for the following:    APPearance HAZY (*)    Protein, ur 100 (*)    Bacteria, UA RARE (*)    Squamous Epithelial / LPF 0-5 (*)    All other components within normal limits  BRAIN NATRIURETIC PEPTIDE - Abnormal; Notable for the following:    B Natriuretic Peptide 287.2 (*)    All other components within normal limits  I-STAT CG4 LACTIC ACID, ED - Abnormal; Notable for the following:    Lactic Acid, Venous 3.75 (*)    All other components within normal limits  I-STAT ARTERIAL BLOOD GAS, ED - Abnormal; Notable for the following:    pH, Arterial 7.339 (*)    Acid-base deficit 3.0 (*)    All other components within normal limits  CULTURE, BLOOD (ROUTINE X 2)  CULTURE, BLOOD (ROUTINE X 2)  URINE CULTURE  TROPONIN I  INFLUENZA PANEL BY PCR (TYPE A & B)  BLOOD GAS, ARTERIAL  I-STAT TROPOININ, ED  I-STAT CG4 LACTIC ACID, ED   EKG  EKG Interpretation  Date/Time:  Saturday January 13 2017 19:50:46 EST Ventricular Rate:  128 PR Interval:    QRS Duration: 86 QT Interval:  221 QTC Calculation: 323 R Axis:   -30 Text Interpretation:  aflutter Artifact Abnormal ekg Confirmed by Carmin Muskrat  MD [id number]) on 01/13/2017 7:53:08 PM      Radiology Ct Angio Chest Pe W Or Wo Contrast  Result Date: 01/14/2017 CLINICAL DATA:  Acute dyspnea, diaphoresis and near syncope. EXAM: CT ANGIOGRAPHY CHEST WITH CONTRAST TECHNIQUE:  Multidetector CT imaging of the chest was performed using the standard protocol during bolus administration of intravenous contrast. Multiplanar CT image reconstructions and MIPs were obtained to evaluate the vascular anatomy. CONTRAST:  80 cc Isovue 370 IV COMPARISON:  CXR from 01/13/2017 FINDINGS: Cardiovascular: Acute bilateral pulmonary emboli starting at the branch point for the right and left pulmonary arteries extending into both lower lobes, left upper lobe, lingula and right middle lobe to the level of the subsegmental branches. RV/LV ratio 1.37 consistent with right heart strain. Borderline cardiomegaly. Aortic atherosclerosis. Aortic atherosclerosis without dissection. Mediastinum/Nodes: Mild thyromegaly. No discrete mass however there is slight heterogeneous enhancement some which is due to streak artifacts from adjacent ribs. No mediastinal adenopathy. Small hiatal hernia. Lungs/Pleura: No pneumonic consolidation. Bibasilar atelectasis. Trace  right pleural effusion. Upper Abdomen: Mild thickening of both adrenal glands. Cholecystectomy. Musculoskeletal: Mid thoracic degenerative disc disease and osteophyte formation consistent with spondylosis. Review of the MIP images confirms the above findings. IMPRESSION: Positive for acute PE, bilateral and multilobar with CT evidence of right heart strain (RV/LV Ratio = 1.37) consistent with at least submassive (intermediate risk) PE. The presence of right heart strain has been associated with an increased risk of morbidity and mortality. Please activate Code PE by paging [phone]. Critical Value/emergent results were called by telephone at the time of interpretation on 01/14/2017 at 12:15 am to Dr. Mitzi Hansen , who verbally acknowledged these results. Mild thyromegaly. Mild adrenal thickening possibly hyperplastic. Cholecystectomy. Electronically Signed   By: Ashley Royalty M.D.   On: 01/14/2017 00:17   Dg Chest Portable 1 View  Result Date:  01/13/2017 CLINICAL DATA:  Difficulty breathing EXAM: PORTABLE CHEST 1 VIEW COMPARISON:  12/27/2016 FINDINGS: Cardiac shadow is again enlarged but stable. Lungs are well aerated bilaterally. No focal infiltrate or sizable effusion is seen. No bony abnormality is noted. IMPRESSION: No active disease. Electronically Signed   By: Inez Catalina M.D.   On: 01/13/2017 20:44   Procedures Procedures (including critical care time)  Medications Ordered in ED Medications  piperacillin-tazobactam (ZOSYN) IVPB 3.375 g (not administered)  vancomycin (VANCOCIN) IVPB 750 mg/150 ml premix (not administered)  sodium chloride 0.9 % bolus 1,000 mL (not administered)  sodium chloride 0.9 % bolus 1,000 mL (0 mLs Intravenous Stopped 01/13/17 2245)  piperacillin-tazobactam (ZOSYN) IVPB 3.375 g (0 g Intravenous Stopped 01/13/17 2146)  vancomycin (VANCOCIN) IVPB 1000 mg/200 mL premix (0 mg Intravenous Stopped 01/13/17 2217)  iopamidol (ISOVUE-370) 76 % injection (100 mLs  Contrast Given 01/13/17 2315)    Initial Impression / Assessment and Plan / ED Course  I have reviewed the triage vital signs and the nursing notes.  83 y.o. female with above stated PMHx, HPI, and physical. Recent outpatient treatment for PNA and initiation of home O2.  Given recent cough with outpatient treatment for pneumonia as well as tachycardia and hypertension, concern for sepsis. Blood and urine cultures collected patient started on broad-spectrum antibiotics with Vancomycin and Zosyn. Patient given IV fluid resuscitation.  Laboratory and imaging results were personally reviewed by myself and used in the medical decision making of this patient's treatment and disposition.  Pt admitted to medicine for further evaluation and management of sepsis. Pt understands and agrees with the plan and has no further questions or concerns.   Pt care discussed with and followed by my attending, Dr. Carmin Muskrat  Mayer Camel, MD Pager [phone]  Final  Clinical Impressions(s) / ED Diagnoses   Final diagnoses:  Hypoxia  Near syncope  Sepsis ([place])  Leukocytosis  Lactic acidosis  AKI (acute kidney injury) ([place])  Hyperglycemia   New Prescriptions New Prescriptions   No medications on file     Mayer Camel, MD 01/14/17 [id number]    Carmin Muskrat, MD 01/16/17 1525

## 2017-01-13 NOTE — ED Triage Notes (Signed)
Per EMS pt diagnosed and treated with pneumonia and bronchitis 2 weeks ago. Completed treatment and was well until around noon today when she started experiencing SHOB.  Pt diaphoretic and had near syncopal episode before calling EMS.  Monitor showed aflutter with no history per family.

## 2017-01-14 ENCOUNTER — Inpatient Hospital Stay (HOSPITAL_COMMUNITY): Payer: Medicare Other

## 2017-01-14 ENCOUNTER — Encounter (HOSPITAL_COMMUNITY): Payer: Self-pay | Admitting: Family Medicine

## 2017-01-14 DIAGNOSIS — I2699 Other pulmonary embolism without acute cor pulmonale: Secondary | ICD-10-CM

## 2017-01-14 DIAGNOSIS — R6510 Systemic inflammatory response syndrome (SIRS) of non-infectious origin without acute organ dysfunction: Secondary | ICD-10-CM

## 2017-01-14 DIAGNOSIS — I2609 Other pulmonary embolism with acute cor pulmonale: Secondary | ICD-10-CM

## 2017-01-14 DIAGNOSIS — E119 Type 2 diabetes mellitus without complications: Secondary | ICD-10-CM

## 2017-01-14 DIAGNOSIS — I10 Essential (primary) hypertension: Secondary | ICD-10-CM | POA: Diagnosis present

## 2017-01-14 DIAGNOSIS — I471 Supraventricular tachycardia: Secondary | ICD-10-CM | POA: Diagnosis present

## 2017-01-14 DIAGNOSIS — R55 Syncope and collapse: Secondary | ICD-10-CM

## 2017-01-14 DIAGNOSIS — I4719 Other supraventricular tachycardia: Secondary | ICD-10-CM | POA: Diagnosis present

## 2017-01-14 DIAGNOSIS — I4892 Unspecified atrial flutter: Secondary | ICD-10-CM

## 2017-01-14 DIAGNOSIS — D72829 Elevated white blood cell count, unspecified: Secondary | ICD-10-CM | POA: Diagnosis present

## 2017-01-14 DIAGNOSIS — R0902 Hypoxemia: Secondary | ICD-10-CM

## 2017-01-14 DIAGNOSIS — R413 Other amnesia: Secondary | ICD-10-CM

## 2017-01-14 HISTORY — DX: Type 2 diabetes mellitus without complications: E11.9

## 2017-01-14 HISTORY — DX: Unspecified atrial flutter: I48.92

## 2017-01-14 LAB — CBC WITH DIFFERENTIAL/PLATELET
BASOS ABS: 0 10*3/uL (ref 0.0–0.1)
BASOS PCT: 0 %
Eosinophils Absolute: 0.1 10*3/uL (ref 0.0–0.7)
Eosinophils Relative: 1 %
HEMATOCRIT: 38.7 % (ref 36.0–46.0)
HEMOGLOBIN: 12.2 g/dL (ref 12.0–15.0)
Lymphocytes Relative: 23 %
Lymphs Abs: 2.3 10*3/uL (ref 0.7–4.0)
MCH: 32.3 pg (ref 26.0–34.0)
MCHC: 31.5 g/dL (ref 30.0–36.0)
MCV: 102.4 fL — AB (ref 78.0–100.0)
MONO ABS: 0.7 10*3/uL (ref 0.1–1.0)
Monocytes Relative: 7 %
NEUTROS ABS: 6.8 10*3/uL (ref 1.7–7.7)
NEUTROS PCT: 69 %
Platelets: 176 10*3/uL (ref 150–400)
RBC: 3.78 MIL/uL — ABNORMAL LOW (ref 3.87–5.11)
RDW: 15.2 % (ref 11.5–15.5)
WBC: 10.1 10*3/uL (ref 4.0–10.5)

## 2017-01-14 LAB — GLUCOSE, CAPILLARY
GLUCOSE-CAPILLARY: 131 mg/dL — AB (ref 65–99)
GLUCOSE-CAPILLARY: 172 mg/dL — AB (ref 65–99)
GLUCOSE-CAPILLARY: 213 mg/dL — AB (ref 65–99)
GLUCOSE-CAPILLARY: 225 mg/dL — AB (ref 65–99)
Glucose-Capillary: 143 mg/dL — ABNORMAL HIGH (ref 65–99)

## 2017-01-14 LAB — TSH: TSH: 2.006 u[IU]/mL (ref 0.350–4.500)

## 2017-01-14 LAB — LACTIC ACID, PLASMA
LACTIC ACID, VENOUS: 2.3 mmol/L — AB (ref 0.5–1.9)
Lactic Acid, Venous: 3.7 mmol/L (ref 0.5–1.9)

## 2017-01-14 LAB — BASIC METABOLIC PANEL
ANION GAP: 11 (ref 5–15)
BUN: 24 mg/dL — ABNORMAL HIGH (ref 6–20)
CO2: 20 mmol/L — AB (ref 22–32)
Calcium: 7.9 mg/dL — ABNORMAL LOW (ref 8.9–10.3)
Chloride: 109 mmol/L (ref 101–111)
Creatinine, Ser: 1.22 mg/dL — ABNORMAL HIGH (ref 0.44–1.00)
GFR, EST AFRICAN AMERICAN: 47 mL/min — AB (ref >60)
GFR, EST NON AFRICAN AMERICAN: 41 mL/min — AB (ref >60)
GLUCOSE: 237 mg/dL — AB (ref 65–99)
Potassium: 4.2 mmol/L (ref 3.5–5.1)
Sodium: 140 mmol/L (ref 135–145)

## 2017-01-14 LAB — ECHOCARDIOGRAM COMPLETE
Height: 63.5 in
Weight: 3389.79 oz

## 2017-01-14 LAB — MAGNESIUM: Magnesium: 1.4 mg/dL — ABNORMAL LOW (ref 1.7–2.4)

## 2017-01-14 LAB — TROPONIN I
Troponin I: <0.03 ng/mL (ref <0.03)
Troponin I: <0.03 ng/mL (ref <0.03)
Troponin I: <0.03 ng/mL (ref <0.03)

## 2017-01-14 LAB — INFLUENZA PANEL BY PCR (TYPE A & B)
INFLAPCR: NEGATIVE
Influenza B By PCR: NEGATIVE

## 2017-01-14 LAB — HEPARIN LEVEL (UNFRACTIONATED)
HEPARIN UNFRACTIONATED: 0.36 [IU]/mL (ref 0.30–0.70)
HEPARIN UNFRACTIONATED: 0.41 [IU]/mL (ref 0.30–0.70)

## 2017-01-14 LAB — MRSA PCR SCREENING: MRSA by PCR: NEGATIVE

## 2017-01-14 MED ORDER — HEPARIN (PORCINE) IN NACL 100-0.45 UNIT/ML-% IJ SOLN
1700.0000 [IU]/h | INTRAMUSCULAR | Status: AC
  Administered 2017-01-14: 1350 [IU]/h via INTRAVENOUS
  Administered 2017-01-14: 1250 [IU]/h via INTRAVENOUS
  Administered 2017-01-15: 1350 [IU]/h via INTRAVENOUS
  Administered 2017-01-16: 1450 [IU]/h via INTRAVENOUS
  Administered 2017-01-17: 1650 [IU]/h via INTRAVENOUS
  Administered 2017-01-18: 1700 [IU]/h via INTRAVENOUS
  Filled 2017-01-14 (×6): qty 250

## 2017-01-14 MED ORDER — SODIUM CHLORIDE 0.9 % IV SOLN
INTRAVENOUS | Status: DC
  Administered 2017-01-14 (×2): via INTRAVENOUS
  Filled 2017-01-14 (×3): qty 1000

## 2017-01-14 MED ORDER — MAGNESIUM SULFATE 4 GM/100ML IV SOLN
4.0000 g | Freq: Once | INTRAVENOUS | Status: AC
  Administered 2017-01-14: 4 g via INTRAVENOUS
  Filled 2017-01-14: qty 100

## 2017-01-14 MED ORDER — ONDANSETRON HCL 4 MG PO TABS: 4.0000 mg | ORAL_TABLET | Freq: Four times a day (QID) | ORAL | Status: DC | PRN

## 2017-01-14 MED ORDER — SODIUM CHLORIDE 0.9 % IV SOLN
250.0000 mL | INTRAVENOUS | Status: DC | PRN
  Administered 2017-01-14: 10 mL/h via INTRAVENOUS

## 2017-01-14 MED ORDER — DILTIAZEM HCL 100 MG IV SOLR
5.0000 mg/h | INTRAVENOUS | Status: DC
  Filled 2017-01-14: qty 100

## 2017-01-14 MED ORDER — AMIODARONE HCL IN DEXTROSE 360-4.14 MG/200ML-% IV SOLN
60.0000 mg/h | INTRAVENOUS | Status: AC
  Administered 2017-01-14 (×2): 60 mg/h via INTRAVENOUS
  Filled 2017-01-14 (×2): qty 200

## 2017-01-14 MED ORDER — HYDROCODONE-ACETAMINOPHEN 5-325 MG PO TABS: 1.0000 | ORAL_TABLET | ORAL | Status: DC | PRN

## 2017-01-14 MED ORDER — SODIUM CHLORIDE 0.9% FLUSH
3.0000 mL | Freq: Two times a day (BID) | INTRAVENOUS | Status: DC
  Administered 2017-01-15 – 2017-01-17 (×5): 3 mL via INTRAVENOUS

## 2017-01-14 MED ORDER — ACETAMINOPHEN 650 MG RE SUPP: 650.0000 mg | Freq: Four times a day (QID) | RECTAL | Status: DC | PRN

## 2017-01-14 MED ORDER — ASPIRIN 81 MG PO CHEW
81.0000 mg | CHEWABLE_TABLET | Freq: Every day | ORAL | Status: DC
Start: 2017-01-14 — End: 2017-01-20
  Administered 2017-01-14 – 2017-01-20 (×7): 81 mg via ORAL
  Filled 2017-01-14 (×7): qty 1

## 2017-01-14 MED ORDER — POLYETHYLENE GLYCOL 3350 17 G PO PACK: 17.0000 g | PACK | Freq: Every day | ORAL | Status: DC | PRN

## 2017-01-14 MED ORDER — AMIODARONE LOAD VIA INFUSION
150.0000 mg | Freq: Once | INTRAVENOUS | Status: AC
  Administered 2017-01-14: 150 mg via INTRAVENOUS
  Filled 2017-01-14: qty 83.34

## 2017-01-14 MED ORDER — SODIUM CHLORIDE 0.9% FLUSH: 3.0000 mL | INTRAVENOUS | Status: DC | PRN

## 2017-01-14 MED ORDER — METOPROLOL TARTRATE 12.5 MG HALF TABLET
12.5000 mg | ORAL_TABLET | Freq: Two times a day (BID) | ORAL | Status: DC
  Administered 2017-01-14 – 2017-01-15 (×2): 12.5 mg via ORAL
  Filled 2017-01-14 (×3): qty 1

## 2017-01-14 MED ORDER — ACETAMINOPHEN 325 MG PO TABS: 650.0000 mg | ORAL_TABLET | Freq: Four times a day (QID) | ORAL | Status: DC | PRN

## 2017-01-14 MED ORDER — HEPARIN BOLUS VIA INFUSION
4500.0000 [IU] | Freq: Once | INTRAVENOUS | Status: AC
  Administered 2017-01-14: 4500 [IU] via INTRAVENOUS
  Filled 2017-01-14: qty 4500

## 2017-01-14 MED ORDER — SODIUM CHLORIDE 0.9 % IV BOLUS (SEPSIS)
1000.0000 mL | Freq: Once | INTRAVENOUS | Status: AC
  Administered 2017-01-14: 1000 mL via INTRAVENOUS

## 2017-01-14 MED ORDER — DILTIAZEM LOAD VIA INFUSION
10.0000 mg | Freq: Once | INTRAVENOUS | Status: DC
  Filled 2017-01-14: qty 10

## 2017-01-14 MED ORDER — AMIODARONE HCL IN DEXTROSE 360-4.14 MG/200ML-% IV SOLN
60.0000 mg/h | INTRAVENOUS | Status: DC
  Administered 2017-01-14: 30 mg/h via INTRAVENOUS
  Administered 2017-01-16 – 2017-01-17 (×3): 60 mg/h via INTRAVENOUS
  Filled 2017-01-14 (×7): qty 200

## 2017-01-14 MED ORDER — BISACODYL 5 MG PO TBEC: 5.0000 mg | DELAYED_RELEASE_TABLET | Freq: Every day | ORAL | Status: DC | PRN

## 2017-01-14 MED ORDER — EZETIMIBE-SIMVASTATIN 10-40 MG PO TABS
1.0000 | ORAL_TABLET | Freq: Every day | ORAL | Status: DC
  Administered 2017-01-14 – 2017-01-17 (×4): 1 via ORAL
  Filled 2017-01-14 (×4): qty 1

## 2017-01-14 MED ORDER — PERFLUTREN LIPID MICROSPHERE
1.0000 mL | INTRAVENOUS | Status: AC | PRN
  Administered 2017-01-14: 2 mL via INTRAVENOUS
  Filled 2017-01-14: qty 10

## 2017-01-14 MED ORDER — INSULIN ASPART 100 UNIT/ML ~~LOC~~ SOLN
0.0000 [IU] | Freq: Every day | SUBCUTANEOUS | Status: DC
  Administered 2017-01-14: 2 [IU] via SUBCUTANEOUS

## 2017-01-14 MED ORDER — NORTRIPTYLINE HCL 25 MG PO CAPS
25.0000 mg | ORAL_CAPSULE | Freq: Every day | ORAL | Status: DC
Start: 2017-01-14 — End: 2017-01-20
  Administered 2017-01-14 – 2017-01-19 (×7): 25 mg via ORAL
  Filled 2017-01-14 (×8): qty 1

## 2017-01-14 MED ORDER — SODIUM CHLORIDE 0.9% FLUSH
3.0000 mL | Freq: Two times a day (BID) | INTRAVENOUS | Status: DC
  Administered 2017-01-14 – 2017-01-15 (×2): 3 mL via INTRAVENOUS

## 2017-01-14 MED ORDER — INSULIN ASPART 100 UNIT/ML ~~LOC~~ SOLN
0.0000 [IU] | Freq: Three times a day (TID) | SUBCUTANEOUS | Status: DC
  Administered 2017-01-14: 5 [IU] via SUBCUTANEOUS
  Administered 2017-01-14: 2 [IU] via SUBCUTANEOUS
  Administered 2017-01-14 – 2017-01-15 (×3): 3 [IU] via SUBCUTANEOUS
  Administered 2017-01-15: 2 [IU] via SUBCUTANEOUS
  Administered 2017-01-15: 3 [IU] via SUBCUTANEOUS
  Administered 2017-01-16: 5 [IU] via SUBCUTANEOUS
  Administered 2017-01-16: 3 [IU] via SUBCUTANEOUS
  Administered 2017-01-16: 8 [IU] via SUBCUTANEOUS
  Administered 2017-01-17: 2 [IU] via SUBCUTANEOUS
  Administered 2017-01-17: 3 [IU] via SUBCUTANEOUS
  Administered 2017-01-17: 5 [IU] via SUBCUTANEOUS
  Administered 2017-01-18: 3 [IU] via SUBCUTANEOUS
  Administered 2017-01-18: 2 [IU] via SUBCUTANEOUS
  Administered 2017-01-18 – 2017-01-20 (×6): 3 [IU] via SUBCUTANEOUS

## 2017-01-14 MED ORDER — ONDANSETRON HCL 4 MG/2ML IJ SOLN: 4.0000 mg | Freq: Four times a day (QID) | INTRAMUSCULAR | Status: DC | PRN

## 2017-01-14 MED ORDER — RISPERIDONE 0.25 MG PO TABS
0.2500 mg | ORAL_TABLET | Freq: Every day | ORAL | Status: DC
  Administered 2017-01-14 – 2017-01-15 (×3): 0.25 mg via ORAL
  Filled 2017-01-14 (×3): qty 1

## 2017-01-14 NOTE — H&P (Signed)
History and Physical    CARMELINA VITERI [id number] DOB: 1934-06-18 DOA: 01/13/2017  PCP: No primary care provider on file.   Patient coming from: Home  Chief Complaint: Diaphoresis, near-syncope, SOB   HPI: Catherine Gonzales is a 83 y.o. female with medical history significant for hypertension, type 2 diabetes mellitus, chronic kidney disease, and dementia with behavioral disturbance who presents to the emergency department with shortness of breath, diaphoresis, and a near syncopal episode. Patient has mild dementia which limits her ability to provide a history. History is augmented through discussion with the patient's daughter and granddaughter at the bedside, as well as review of the EMR. Patient had reportedly developed dyspnea and a productive cough 1 month ago, was diagnosed with bronchitis, and started on amoxicillin. The productive cough resolved with treatment, but her dyspnea persisted and even worsened. She was noted to be hypoxic, was diagnosed with pneumonia on 01/02/2017, given 1 dose of Rocephin, azithromycin, and was set up for home O2. Family reports that the patient's dyspnea has continued to worsen despite her completion of the antibiotics and the supplemental oxygen. The cough has improved and is no longer productive, but the patient becomes dyspneic with minimal exertion, and sometimes at rest. Just prior to arrival, patient was in respiratory distress while at rest, noted by family to become pale and diaphoretic, and she was near syncopal. She was brought into the ED for evaluation of this.  ED Course: Upon arrival to the ED, patient is found to be afebrile, saturating mid 80s on room air, tachypneic in the 30s, tachycardic to 0000000, and with systolic blood pressure of 87. EKG features a tachyarrhythmia with rate 128 and likely atrial fibrillation. Chest x-ray is negative for acute cardiopulmonary disease and chemistry panels notable for a BUN of 29 and serum creatinine 0000000 with  uncertain baseline. CBC features a mild leukocytosis to 13,900 and macrocytosis without anemia, MCV 102.0. Lactic acid is elevated to 3.75, troponin is undetectable, and BNP is elevated to 287. Urinalysis is unremarkable. Blood and urine cultures were obtained, 1 L of normal saline was administered, and the patient was started on empiric vancomycin and Zosyn for suspected sepsis with uncertain source. Blood pressure improved with fluids and remains stable, but the patient continued to be tachycardic and required supplemental oxygen. She will be admitted to the stepdown unit for ongoing evaluation and management of near syncopal episode with acute hypoxic respiratory failure with a new tachyarrhythmia.   Review of Systems:  All other systems reviewed and apart from HPI, are negative.  Past Medical History:  Diagnosis Date  . Chronic kidney disease   . Dementia   . Diabetes mellitus without complication ([place])   . Hypertension     Past Surgical History:  Procedure Laterality Date  . CHOLECYSTECTOMY       reports that she has never smoked. She has never used smokeless tobacco. She reports that she does not drink alcohol or use drugs.  No Known Allergies  Family History  Problem Relation Age of Onset  . Deep vein thrombosis Neg Hx      Prior to Admission medications   Medication Sig Start Date End Date Taking? Authorizing Provider  aspirin 81 MG chewable tablet Chew 81 mg by mouth daily.   Yes Historical Provider, MD  ezetimibe-simvastatin (VYTORIN) 10-40 MG tablet Take 1 tablet by mouth daily.   Yes Historical Provider, MD  lisinopril (PRINIVIL,ZESTRIL) 40 MG tablet Take 40 mg by mouth daily.   Yes Historical  Provider, MD  metoprolol succinate (TOPROL-XL) 100 MG 24 hr tablet Take 100 mg by mouth daily. Take with or immediately following a meal.   Yes Historical Provider, MD  nortriptyline (PAMELOR) 25 MG capsule Take 25 mg by mouth at bedtime.   Yes Historical Provider, MD  risperiDONE  (RISPERDAL) 0.25 MG tablet Take 0.25 mg by mouth at bedtime.   Yes Historical Provider, MD  SitaGLIPtin-MetFORMIN HCl (JANUMET XR) [phone] MG TB24 Take 1 tablet by mouth daily.   Yes Historical Provider, MD    Physical Exam: Vitals:   01/13/17 2215 01/13/17 2230 01/13/17 2300 01/14/17 0045  BP: 118/85 99/76 112/78 123/81  Pulse: (!) 124 (!) 125 (!) 125 (!) 128  Resp: (!) 29 (!) 30 23 (!) 28  Temp:    98.2 F (36.8 C)  TempSrc:    Oral  SpO2: 94% 95% 95% 96%  Weight:      Height:          Constitutional: mild distress with tachypnea, dyspneic with 4-5 words. No pallor or diaphoresis. Eyes: PERTLA, lids and conjunctivae normal ENMT: Mucous membranes are moist. Posterior pharynx clear of any exudate or lesions.   Neck: normal, supple, no masses, no thyromegaly Respiratory: Tachypnea, no wheeze, crackles, or rhonchi appreciated. No accessory muscle use.  Cardiovascular: Rate ~120 and regular. Skin is dry. JVP not well-visualized.  Abdomen: No distension, no tenderness, no masses palpated. Bowel sounds normal.  Musculoskeletal: no clubbing / cyanosis. No joint deformity upper and lower extremities. Normal muscle tone.  Skin: no significant rashes, lesions, ulcers. Warm, dry, well-perfused. Neurologic: CN 2-12 grossly intact. Sensation intact, DTR normal. Strength 5/5 in all 4 limbs.  Psychiatric: Alert and oriented x 3. Normal mood and affect.     Labs on Admission: I have personally reviewed following labs and imaging studies  CBC:  Recent Labs Lab 01/13/17 2010  WBC 13.9*  NEUTROABS 10.2*  HGB 14.7  HCT 46.0  MCV 102.0*  PLT 123XX123   Basic Metabolic Panel:  Recent Labs Lab 01/13/17 2010  NA 140  K 4.6  CL 104  CO2 22  GLUCOSE 235*  BUN 29*  CREATININE 1.39*  CALCIUM 9.2   GFR: Estimated Creatinine Clearance: 37.3 mL/min (by C-G formula based on SCr of 1.39 mg/dL (H)). Liver Function Tests:  Recent Labs Lab 01/13/17 2010  AST 63*  ALT 48  ALKPHOS 117    BILITOT 0.4  PROT 6.8  ALBUMIN 3.7   No results for input(s): LIPASE, AMYLASE in the last 168 hours. No results for input(s): AMMONIA in the last 168 hours. Coagulation Profile: No results for input(s): INR, PROTIME in the last 168 hours. Cardiac Enzymes:  Recent Labs Lab 01/13/17 2010  TROPONINI <0.03   BNP (last 3 results) No results for input(s): PROBNP in the last 8760 hours. HbA1C: No results for input(s): HGBA1C in the last 72 hours. CBG:  Recent Labs Lab 01/14/17 0055  GLUCAP 213*   Lipid Profile: No results for input(s): CHOL, HDL, LDLCALC, TRIG, CHOLHDL, LDLDIRECT in the last 72 hours. Thyroid Function Tests: No results for input(s): TSH, T4TOTAL, FREET4, T3FREE, THYROIDAB in the last 72 hours. Anemia Panel: No results for input(s): VITAMINB12, FOLATE, FERRITIN, TIBC, IRON, RETICCTPCT in the last 72 hours. Urine analysis:    Component Value Date/Time   COLORURINE YELLOW 01/13/2017 2138   APPEARANCEUR HAZY (A) 01/13/2017 2138   LABSPEC 1.027 01/13/2017 2138   PHURINE 5.0 01/13/2017 2138   GLUCOSEU NEGATIVE 01/13/2017 2138   HGBUR  NEGATIVE 01/13/2017 2138   BILIRUBINUR NEGATIVE 01/13/2017 2138   [place] 01/13/2017 2138   PROTEINUR 100 (A) 01/13/2017 2138   NITRITE NEGATIVE 01/13/2017 2138   LEUKOCYTESUR NEGATIVE 01/13/2017 2138   Sepsis Labs: @LABRCNTIP (procalcitonin:4,lacticidven:4) )No results found for this or any previous visit (from the past 240 hour(s)).   Radiological Exams on Admission: Ct Angio Chest Pe W Or Wo Contrast  Result Date: 01/14/2017 CLINICAL DATA:  Acute dyspnea, diaphoresis and near syncope. EXAM: CT ANGIOGRAPHY CHEST WITH CONTRAST TECHNIQUE: Multidetector CT imaging of the chest was performed using the standard protocol during bolus administration of intravenous contrast. Multiplanar CT image reconstructions and MIPs were obtained to evaluate the vascular anatomy. CONTRAST:  80 cc Isovue 370 IV COMPARISON:  CXR from  01/13/2017 FINDINGS: Cardiovascular: Acute bilateral pulmonary emboli starting at the branch point for the right and left pulmonary arteries extending into both lower lobes, left upper lobe, lingula and right middle lobe to the level of the subsegmental branches. RV/LV ratio 1.37 consistent with right heart strain. Borderline cardiomegaly. Aortic atherosclerosis. Aortic atherosclerosis without dissection. Mediastinum/Nodes: Mild thyromegaly. No discrete mass however there is slight heterogeneous enhancement some which is due to streak artifacts from adjacent ribs. No mediastinal adenopathy. Small hiatal hernia. Lungs/Pleura: No pneumonic consolidation. Bibasilar atelectasis. Trace right pleural effusion. Upper Abdomen: Mild thickening of both adrenal glands. Cholecystectomy. Musculoskeletal: Mid thoracic degenerative disc disease and osteophyte formation consistent with spondylosis. Review of the MIP images confirms the above findings. IMPRESSION: Positive for acute PE, bilateral and multilobar with CT evidence of right heart strain (RV/LV Ratio = 1.37) consistent with at least submassive (intermediate risk) PE. The presence of right heart strain has been associated with an increased risk of morbidity and mortality. Please activate Code PE by paging [phone]. Critical Value/emergent results were called by telephone at the time of interpretation on 01/14/2017 at 12:15 am to Dr. Mitzi Hansen , who verbally acknowledged these results. Mild thyromegaly. Mild adrenal thickening possibly hyperplastic. Cholecystectomy. Electronically Signed   By: Ashley Royalty M.D.   On: 01/14/2017 00:17   Dg Chest Portable 1 View  Result Date: 01/13/2017 CLINICAL DATA:  Difficulty breathing EXAM: PORTABLE CHEST 1 VIEW COMPARISON:  12/27/2016 FINDINGS: Cardiac shadow is again enlarged but stable. Lungs are well aerated bilaterally. No focal infiltrate or sizable effusion is seen. No bony abnormality is noted. IMPRESSION: No active  disease. Electronically Signed   By: Inez Catalina M.D.   On: 01/13/2017 20:44    EKG: Independently reviewed. Tachyarrhythmia, likely atrial flutter; repeat EKG ordered and pending  Assessment/Plan  1. Acute PE with heart strain, acute hypoxic respiratory failure   - Pt presents with dyspnea, hypoxia, and near-syncopal episode  - CXR is clear and there is no fever  - BP soft initially, improved with IVF  - CTA PE study reveals b/l multilobar PE with right heart strain  - Heparin infusion initiated, TTE ordered, cycling troponin - Case was discussed with PCCM and they will plan to evaluate patient; consultation much appreciated   2. Near-syncope  - Likely secondary to acute PE  - She is being monitored on telemetry and TTE is ordered   3. Tachyarrhythmia    - HR persisting in 130 range with apparent atrial flutter on EKG  - There is no hx of a fib or a flutter  - CHADS-VASc at least 5 (age x2, gender, DM, HTN) - She has been started on heparin infusion as above  - Diltiazem ordered  - Continuous cardiac  monitoring    4. SIRS - Pt presents with mild leukocytosis, hypoxia/tachypnea, soft BP, and elevated lactate  - CXR appears clear; UA unremarkable; no fever or flu-like sxs  - Blood and urine cultures are incubating and she received empiric doses of vancomycin and Zosyn in ED  - Suspect the SIRS criteria above are reflective of acute multilobar PE with heart-strain  - Plan to follow cultures but watch off of abx for now    5. Kidney disease - SCr is 1.39 on admission with unknown baseline - Per family, she has been followed outpatient for CKD  - She received 2 liters NS in ED; she also received contrast for CTA  - Lisinopril is held on admission - Repeat chem panel in am   6. Hypertension - BP was soft initially, improved with IVF  - Managed at home with lisinopril and metoprolol - Metoprolol held in light of PE with heart-strain; lisinopril held given elevated SCr with  unknown baseline  7. Type II DM  - No A1c on file  - Managed with Janumet at home, this is held on admission - Check CBG with meals and qHS - Start a moderate-intensity sliding-scale correctional   8. Dementia with behavioral disturbance  - Appears to be stable on admission; pt pleasant and cooperative  - Continue Risperdal qHS    DVT prophylaxis: heparin infusion  Code Status: Full  Family Communication: Daughter and granddaughter updated at bedside Disposition Plan: Admit to stepdown unit Consults called: Pulmonary/Critical Care Admission status: Inpatient    Vianne Bulls, MD [facility] Pager [phone]  If 7PM-7AM, please contact night-coverage [email or web address] Password [email or web address]  01/14/2017, 1:16 AM

## 2017-01-14 NOTE — Consult Note (Signed)
Pulmonary Consult Note:  S: 83 y/o woman with submassive PE and syncope.  O: Exam notable for tachycardia. Labs notable for elevated lactic acid (not long after syncopal episode). C/s for ?EKKOS vs lytics.  A/P: Presently stable, do not recommend tPA as clinically stable. Discussed at length with family. Also, will d/c cardizem as tachycardia is likely compensatory given PE.  Full consult note to follow.  Luz Brazen, MD Pulmonary & Critical Care Medicine January 14, 2017, 1:40 AM [phone]

## 2017-01-14 NOTE — Progress Notes (Signed)
PROGRESS NOTE    Catherine Gonzales  [id number] DOB: May 05, 1934 DOA: 01/13/2017 PCP: No primary care provider on file.    Brief Narrative:  Patient is a pleasant 83 year old female history of hypertension, type 2 diabetes, dementia, chronic kidney disease with unknown baseline, presenting to the ED with worsening shortness of breath, diaphoresis and near syncopal episode. Patient recently treated with antibiotics in the outpatient setting for probable bronchitis. Patient had worsening dyspnea and noted to be hypoxic and evaluated and noted to have bilateral submassive pulmonary emboli with right ventricular strain and also in atrial flutter with soft blood pressures.PCCM consulted. Will consult with cardiology.   Assessment & Plan:   Principal Problem:   Acute pulmonary embolism (HCC) Active Problems:   Acute respiratory failure with hypoxia (HCC)   Atrial flutter (HCC)   Kidney disease   SIRS (systemic inflammatory response syndrome) (HCC)   Memory deficit   Hypertension   Diabetes mellitus type II, non insulin dependent (HCC)   Atrial tachycardia (HCC)   Near syncope   Hypoxia  #1 subacute submassive bilateral multilobar pulmonary emboli rate right ventricular strain/acute hypoxic respiratory failure Questionable etiology. Patient denies any recent surgeries or long car rides or family history of DVT. Patient with no current history of neoplasm however states she is due for colonoscopy and mammogram soon. Patient in some respiratory distress and tachycardic. CT chest which was done on admission consistent with bilateral multilobar pulmonary emboli with right ventricular strain. 2-D echo pending. Lower extremity Dopplers pending. Blood pressures soft and this morning systolic blood pressure was 90. Repeat systolic blood pressure [id number]. Give 1 L bolus of normal saline and placed on maintenance fluids normal saline at 100 mL/h. Monitor blood pressure closely. Continue IV heparin. Patient  has been seen in consultation by PCCM, who feel that patient submassive pulmonary emboli subacute and has been developing over some weeks and a such lytics and EKKOS are not indicated at this time.PCCM following and appreciate input and recommendations.  #2 new onset atrial flutter ( CHA2DS2VASC SCORE = 5) Likely secondary to submassive bilateral multilobar pulmonary emboli. Patient with heart rates in the 130s with a soft blood pressure. Patient was initially placed on Cardizem however this was discontinued by critical care. Patient with heart rates sustaining in the 130s with a soft systolic blood pressure. Cardiac enzymes negative to date. Check a TSH. 2-D echo pending. Place on IV amiodarone. Patient on IV heparin. Consult with cardiology for further evaluation and management.  #3 near syncope Likely secondary to acute bilateral PE. Patient with no further episodes. Continue treatment for bilateral PE with IV heparin. 2-D echo is pending. Follow.  #4 systemic inflammatory response syndrome Patient had presented with a mild leukocytosis, hypoxia, tachypnea, soft blood pressure and elevated lactic acid level. Chest x-ray negative for any acute infiltrate. Urinalysis unremarkable. Patient has been pancultured and results pending. SIRS likely secondary to problem #1. Patient received a dose of IV vancomycin and IV Zosyn in the ED. No need for and about except this time. Follow.  #5 chronic kidney disease Unknown baseline. Patient received contrast for CT angiogram. Lisinopril on hold. Patient on IV fluids. Monitor renal function.  #6 hypertension Systolic blood pressures soft. Hold antihypertensive medications. IV fluids. Follow.  #7 hyperlipidemia Continue Vytorin.  #8 diabetes mellitus Type 2 Hemoglobin A1c pending. CBG this morning was 213. Sliding-scale insulin.  #9 dementia with behavioral disturbance Stable. Continue risperdal daily at bedtime.     DVT prophylaxis: Heparin Code  Status:  Full Family Communication: Updated patient and son at bedside. Disposition Plan: Home versus SNF once medically stable and bilateral PE has been treated with improvement in respiratory distress and resolution of atrial flutter.   Consultants:   PCCM: Dr Judge Stall 01/14/2017  Procedures:   CT angiogram chest 01/13/2017  Chest x-ray 01/13/2017    Antimicrobials:   None   Subjective: Patient states no significant change in shortness of breath. Patient denies any chest pain.  Objective: Vitals:   01/14/17 0333 01/14/17 0335 01/14/17 0700 01/14/17 0803  BP: 90/73 90/73  102/72  Pulse: (!) 136 (!) 134 (!) 133 (!) 133  Resp: (!) 21 (!) 35 (!) 25 20  Temp: 97.3 F (36.3 C)   97.9 F (36.6 C)  TempSrc: Oral   Oral  SpO2: 94% 93% 93% 91%  Weight: 96.1 kg (211 lb 13.8 oz)     Height: 5' 3.5" (1.613 m)       Intake/Output Summary (Last 24 hours) at 01/14/17 0857 Last data filed at 01/14/17 0800  Gross per 24 hour  Intake             2250 ml  Output              550 ml  Net             1700 ml   Filed Weights   01/13/17 2145 01/13/17 2202 01/14/17 0333  Weight: 90.7 kg (200 lb) 104.3 kg (230 lb) 96.1 kg (211 lb 13.8 oz)    Examination:  General exam: Visibly SOB. Respiratory system: Poor air movement. Visibly short of breath.  Respiratory effort normal. Cardiovascular system: Irregularly irregular. No JVD, murmurs, rubs, gallops or clicks. No pedal edema. Gastrointestinal system: Abdomen is nondistended, soft and nontender. No organomegaly or masses felt. Normal bowel sounds heard. Central nervous system: Alert and oriented. No focal neurological deficits. Extremities: Symmetric 5 x 5 power. Skin: No rashes, lesions or ulcers Psychiatry: Judgement and insight appear normal. Mood & affect appropriate.     Data Reviewed: I have personally reviewed following labs and imaging studies  CBC:  Recent Labs Lab 01/13/17 2010  WBC 13.9*  NEUTROABS 10.2*  HGB  14.7  HCT 46.0  MCV 102.0*  PLT 123XX123   Basic Metabolic Panel:  Recent Labs Lab 01/13/17 2010  NA 140  K 4.6  CL 104  CO2 22  GLUCOSE 235*  BUN 29*  CREATININE 1.39*  CALCIUM 9.2   GFR: Estimated Creatinine Clearance: 36 mL/min (by C-G formula based on SCr of 1.39 mg/dL (H)). Liver Function Tests:  Recent Labs Lab 01/13/17 2010  AST 63*  ALT 48  ALKPHOS 117  BILITOT 0.4  PROT 6.8  ALBUMIN 3.7   No results for input(s): LIPASE, AMYLASE in the last 168 hours. No results for input(s): AMMONIA in the last 168 hours. Coagulation Profile: No results for input(s): INR, PROTIME in the last 168 hours. Cardiac Enzymes:  Recent Labs Lab 01/13/17 2010 01/14/17 0143 01/14/17 0713  TROPONINI <0.03 <0.03 <0.03   BNP (last 3 results) No results for input(s): PROBNP in the last 8760 hours. HbA1C: No results for input(s): HGBA1C in the last 72 hours. CBG:  Recent Labs Lab 01/14/17 0055 01/14/17 0833  GLUCAP 213* 143*   Lipid Profile: No results for input(s): CHOL, HDL, LDLCALC, TRIG, CHOLHDL, LDLDIRECT in the last 72 hours. Thyroid Function Tests: No results for input(s): TSH, T4TOTAL, FREET4, T3FREE, THYROIDAB in the last 72 hours. Anemia Panel: No results for  input(s): VITAMINB12, FOLATE, FERRITIN, TIBC, IRON, RETICCTPCT in the last 72 hours. Sepsis Labs:  Recent Labs Lab 01/13/17 2025 01/14/17 0143  LATICACIDVEN 3.75* 3.7*    Recent Results (from the past 240 hour(s))  MRSA PCR Screening     Status: None   Collection Time: 01/14/17  3:39 AM  Result Value Ref Range Status   MRSA by PCR NEGATIVE NEGATIVE Final    Comment:        The GeneXpert MRSA Assay (FDA approved for NASAL specimens only), is one component of a comprehensive MRSA colonization surveillance program. It is not intended to diagnose MRSA infection nor to guide or monitor treatment for MRSA infections.          Radiology Studies: Ct Angio Chest Pe W Or Wo Contrast  Result  Date: 01/14/2017 CLINICAL DATA:  Acute dyspnea, diaphoresis and near syncope. EXAM: CT ANGIOGRAPHY CHEST WITH CONTRAST TECHNIQUE: Multidetector CT imaging of the chest was performed using the standard protocol during bolus administration of intravenous contrast. Multiplanar CT image reconstructions and MIPs were obtained to evaluate the vascular anatomy. CONTRAST:  80 cc Isovue 370 IV COMPARISON:  CXR from 01/13/2017 FINDINGS: Cardiovascular: Acute bilateral pulmonary emboli starting at the branch point for the right and left pulmonary arteries extending into both lower lobes, left upper lobe, lingula and right middle lobe to the level of the subsegmental branches. RV/LV ratio 1.37 consistent with right heart strain. Borderline cardiomegaly. Aortic atherosclerosis. Aortic atherosclerosis without dissection. Mediastinum/Nodes: Mild thyromegaly. No discrete mass however there is slight heterogeneous enhancement some which is due to streak artifacts from adjacent ribs. No mediastinal adenopathy. Small hiatal hernia. Lungs/Pleura: No pneumonic consolidation. Bibasilar atelectasis. Trace right pleural effusion. Upper Abdomen: Mild thickening of both adrenal glands. Cholecystectomy. Musculoskeletal: Mid thoracic degenerative disc disease and osteophyte formation consistent with spondylosis. Review of the MIP images confirms the above findings. IMPRESSION: Positive for acute PE, bilateral and multilobar with CT evidence of right heart strain (RV/LV Ratio = 1.37) consistent with at least submassive (intermediate risk) PE. The presence of right heart strain has been associated with an increased risk of morbidity and mortality. Please activate Code PE by paging [phone]. Critical Value/emergent results were called by telephone at the time of interpretation on 01/14/2017 at 12:15 am to Dr. Mitzi Hansen , who verbally acknowledged these results. Mild thyromegaly. Mild adrenal thickening possibly hyperplastic.  Cholecystectomy. Electronically Signed   By: Ashley Royalty M.D.   On: 01/14/2017 00:17   Dg Chest Portable 1 View  Result Date: 01/13/2017 CLINICAL DATA:  Difficulty breathing EXAM: PORTABLE CHEST 1 VIEW COMPARISON:  12/27/2016 FINDINGS: Cardiac shadow is again enlarged but stable. Lungs are well aerated bilaterally. No focal infiltrate or sizable effusion is seen. No bony abnormality is noted. IMPRESSION: No active disease. Electronically Signed   By: Inez Catalina M.D.   On: 01/13/2017 20:44        Scheduled Meds: . amiodarone  150 mg Intravenous Once  . aspirin  81 mg Oral Daily  . ezetimibe-simvastatin  1 tablet Oral Daily  . insulin aspart  0-15 Units Subcutaneous TID WC  . insulin aspart  0-5 Units Subcutaneous QHS  . nortriptyline  25 mg Oral QHS  . risperiDONE  0.25 mg Oral QHS  . sodium chloride flush  3 mL Intravenous Q12H  . sodium chloride flush  3 mL Intravenous Q12H   Continuous Infusions: . amiodarone     Followed by  . amiodarone    . heparin 1,250 Units/hr (01/14/17  0128)  . sodium chloride 0.9 % 1,000 mL infusion       LOS: 1 day    Time spent: 11 mins    THOMPSON,DANIEL, MD [facility] Pager [phone]  If 7PM-7AM, please contact night-coverage [email or web address] Password [facility] 01/14/2017, 8:57 AM

## 2017-01-14 NOTE — Progress Notes (Addendum)
ANTICOAGULATION CONSULT NOTE  Pharmacy Consult for Heparin Indication: pulmonary embolus  No Known Allergies  Patient Measurements: Height: 5' 3.5" (161.3 cm) Weight: 211 lb 13.8 oz (96.1 kg) IBW/kg (Calculated) : 53.55 Heparin Dosing Weight: 75.7 kg  Vital Signs: Temp: 98.1 F (36.7 C) (01/28 1950) Temp Source: Oral (01/28 1950) BP: 121/95 (01/28 2200) Pulse Rate: 135 (01/28 2200)  Labs:  Recent Labs  01/13/17 2010 01/14/17 0143 01/14/17 0713 01/14/17 1019 01/14/17 2223  HGB 14.7  --   --  12.2  --   HCT 46.0  --   --  38.7  --   PLT 216  --   --  176  --   HEPARINUNFRC  --   --   --  0.36 0.41  CREATININE 1.39*  --   --  1.22*  --   TROPONINI <0.03 <0.03 <0.03 <0.03  --     Estimated Creatinine Clearance: 41 mL/min (by C-G formula based on SCr of 1.22 mg/dL (H)).   Assessment: 83 y.o. F presents with SOB. Initially started on abx for possible PNA. CT scan shows positive for acute PE, bilateral and multilobar with CT evidence of right heart strain (RV/LV Ratio = 1.37) consistent with at least submassive (intermediate risk) PE. Heparin started. .   Heparin level remains in range tonight a 0.41 units/mL. No bleeding noted.  Goal of Therapy:  Heparin level 0.3-0.7 units/ml Monitor platelets by anticoagulation protocol: Yes   Plan:  Continue Heparin gtt at 1350 units/h Daily heparin level and CBC Monitor S/Sx bleeding  Avianna Moynahan D. Galit Urich, PharmD, BCPS Clinical Pharmacist Pager: [phone] 01/14/2017 11:11 PM

## 2017-01-14 NOTE — Progress Notes (Signed)
VASCULAR LAB PRELIMINARY  PRELIMINARY  PRELIMINARY  PRELIMINARY  Bilateral lower extremity venous duplex completed.    Preliminary report:  There is acute DVT noted in the bilateral peroneal veins.  Results given to Earlie Server, RN  Mauro Kaufmann, Brimhall Nizhoni, RVT 01/14/2017, 10:04 AM

## 2017-01-14 NOTE — Consult Note (Signed)
PULMONARY  / [place]   Name: Catherine Gonzales MRN: [id number] DOB: [birth year]    ADMISSION DATE:  01/13/2017 CONSULTATION DATE: January 14, 2017  REQUESTING CLINICIAN: January 14, 2017  PRIMARY SERVICE: Hospitalist  CHIEF COMPLAINT:  dyspnea  BRIEF PATIENT DESCRIPTION: 83 y/o woman with PE  SIGNIFICANT EVENTS / STUDIES:  CT-A chest showing bilateral PEs  LINES / TUBES: PIVs  CULTURES: None  ANTIBIOTICS: None  HISTORY OF PRESENT ILLNESS:   83y/o F with hx of HTN, DM2, CKD, and dementia who presented with dyspnea, diaphroresis, and sycnope. She reported a one-month history of worsening dyspnea, and had been treated for bronchitis, pneumonia, and was found to be hypoxic. It is unclear what workup was done, but she was prescribed home oxygen, but without a diagnosis or explanation for her hypoxia. On the day of admission her dyspnea had worsened more, and after a syncopal episode, she was brought to the ED.  PAST MEDICAL HISTORY :  Past Medical History:  Diagnosis Date  . Chronic kidney disease   . Dementia   . Diabetes mellitus without complication ([place])   . Hypertension    Past Surgical History:  Procedure Laterality Date  . CHOLECYSTECTOMY     Prior to Admission medications   Medication Sig Start Date End Date Taking? Authorizing Provider  aspirin 81 MG chewable tablet Chew 81 mg by mouth daily.   Yes Historical Provider, MD  ezetimibe-simvastatin (VYTORIN) 10-40 MG tablet Take 1 tablet by mouth daily.   Yes Historical Provider, MD  lisinopril (PRINIVIL,ZESTRIL) 40 MG tablet Take 40 mg by mouth daily.   Yes Historical Provider, MD  metoprolol succinate (TOPROL-XL) 100 MG 24 hr tablet Take 100 mg by mouth daily. Take with or immediately following a meal.   Yes Historical Provider, MD  nortriptyline (PAMELOR) 25 MG capsule Take 25 mg by mouth at bedtime.   Yes Historical Provider, MD  risperiDONE (RISPERDAL) 0.25 MG tablet Take 0.25 mg by mouth  at bedtime.   Yes Historical Provider, MD  SitaGLIPtin-MetFORMIN HCl (JANUMET XR) [phone] MG TB24 Take 1 tablet by mouth daily.   Yes Historical Provider, MD   No Known Allergies  FAMILY HISTORY:  Family History  Problem Relation Age of Onset  . Deep vein thrombosis Neg Hx    SOCIAL HISTORY:  reports that she has never smoked. She has never used smokeless tobacco. She reports that she does not drink alcohol or use drugs.  REVIEW OF SYSTEMS:  Per HPI  SUBJECTIVE:   VITAL SIGNS: Temp:  [97.3 F (36.3 C)-98.2 F (36.8 C)] 97.3 F (36.3 C) (01/28 0333) Pulse Rate:  [124-136] 134 (01/28 0335) Resp:  [21-35] 35 (01/28 0335) BP: (87-127)/(44-85) 90/73 (01/28 0335) SpO2:  [84 %-100 %] 93 % (01/28 0335) Weight:  [200 lb (90.7 kg)-230 lb (104.3 kg)] 211 lb 13.8 oz (96.1 kg) (01/28 0333) HEMODYNAMICS:   VENTILATOR SETTINGS:   INTAKE / OUTPUT: Intake/Output      01/27 0701 - 01/28 0700   IV Piggyback 1250   Total Intake(mL/kg) 1250 (13)   Urine (mL/kg/hr) 550   Total Output 550   Net +700       Urine Occurrence 1 x     PHYSICAL EXAMINATION: Constitutional: mild distress with tachypnea Eyes: conjunctivae normal ENMT: Mucous membranes are moist. Posterior pharynx clear of any exudate or lesions.   Neck: normal, supple, no masses Respiratory: Tachypnea, no wheeze, crackles, or rhonchi appreciated. No accessory muscle use.  Cardiovascular: Rate ~120  and regular. Skin is dry. JVP not well-visualized.  Abdomen: No distension, no tenderness, no masses palpated. Bowel sounds normal.  Musculoskeletal: no clubbing / cyanosis. No joint deformity upper and lower extremities. Normal muscle tone.  Skin: no significant rashes, lesions, ulcers. Warm, dry, well-perfused.   LABS:  CBC  Recent Labs Lab 01/13/17 2010  WBC 13.9*  HGB 14.7  HCT 46.0  PLT 216   Coag's No results for input(s): APTT, INR in the last 168 hours. BMET  Recent Labs Lab 01/13/17 2010  NA 140  K 4.6   CL 104  CO2 22  BUN 29*  CREATININE 1.39*  GLUCOSE 235*   Electrolytes  Recent Labs Lab 01/13/17 2010  CALCIUM 9.2   Sepsis Markers  Recent Labs Lab 01/13/17 2025 01/14/17 0143  LATICACIDVEN 3.75* 3.7*   ABG  Recent Labs Lab 01/13/17 2036  PHART 7.339*  PCO2ART 43.2  PO2ART 108.0   Liver Enzymes  Recent Labs Lab 01/13/17 2010  AST 63*  ALT 48  ALKPHOS 117  BILITOT 0.4  ALBUMIN 3.7   Cardiac Enzymes  Recent Labs Lab 01/13/17 2010 01/14/17 0143  TROPONINI <0.03 <0.03   Glucose  Recent Labs Lab 01/14/17 0055  GLUCAP 213*    Imaging Ct Angio Chest Pe W Or Wo Contrast  Result Date: 01/14/2017 CLINICAL DATA:  Acute dyspnea, diaphoresis and near syncope. EXAM: CT ANGIOGRAPHY CHEST WITH CONTRAST TECHNIQUE: Multidetector CT imaging of the chest was performed using the standard protocol during bolus administration of intravenous contrast. Multiplanar CT image reconstructions and MIPs were obtained to evaluate the vascular anatomy. CONTRAST:  80 cc Isovue 370 IV COMPARISON:  CXR from 01/13/2017 FINDINGS: Cardiovascular: Acute bilateral pulmonary emboli starting at the branch point for the right and left pulmonary arteries extending into both lower lobes, left upper lobe, lingula and right middle lobe to the level of the subsegmental branches. RV/LV ratio 1.37 consistent with right heart strain. Borderline cardiomegaly. Aortic atherosclerosis. Aortic atherosclerosis without dissection. Mediastinum/Nodes: Mild thyromegaly. No discrete mass however there is slight heterogeneous enhancement some which is due to streak artifacts from adjacent ribs. No mediastinal adenopathy. Small hiatal hernia. Lungs/Pleura: No pneumonic consolidation. Bibasilar atelectasis. Trace right pleural effusion. Upper Abdomen: Mild thickening of both adrenal glands. Cholecystectomy. Musculoskeletal: Mid thoracic degenerative disc disease and osteophyte formation consistent with spondylosis.  Review of the MIP images confirms the above findings. IMPRESSION: Positive for acute PE, bilateral and multilobar with CT evidence of right heart strain (RV/LV Ratio = 1.37) consistent with at least submassive (intermediate risk) PE. The presence of right heart strain has been associated with an increased risk of morbidity and mortality. Please activate Code PE by paging [phone]. Critical Value/emergent results were called by telephone at the time of interpretation on 01/14/2017 at 12:15 am to Dr. Mitzi Hansen , who verbally acknowledged these results. Mild thyromegaly. Mild adrenal thickening possibly hyperplastic. Cholecystectomy. Electronically Signed   By: Ashley Royalty M.D.   On: 01/14/2017 00:17   Dg Chest Portable 1 View  Result Date: 01/13/2017 CLINICAL DATA:  Difficulty breathing EXAM: PORTABLE CHEST 1 VIEW COMPARISON:  12/27/2016 FINDINGS: Cardiac shadow is again enlarged but stable. Lungs are well aerated bilaterally. No focal infiltrate or sizable effusion is seen. No bony abnormality is noted. IMPRESSION: No active disease. Electronically Signed   By: Inez Catalina M.D.   On: 01/13/2017 20:44    EKG: Sinus tachycardia, possible flutter CXR: Chest CT shows bilateral pulmonary emboli and normal lung parnchymea   ASSESSMENT /  PLAN:  Submassive Pulmonary Embolii, subacute Likely have been developing some some weeks given her hypoxia prior. As such, lytics and EKKOS are not indicated. She is started on a heparin gtt. She is hemodynamically doing well, although she is tachycardic. This may be a sepearate issue and may be A-flutter, so may consider rate control.   TODAY'S SUMMARY: 83 y/o woman with submassive subacute PE.  I have personally obtained a history, examined the patient, evaluated laboratory and imaging results, formulated the assessment and plan and placed orders.  CRITICAL CARE: The patient is critically ill with multiple organ systems failure and requires high complexity  decision making for assessment and support, frequent evaluation and titration of therapies, application of advanced monitoring technologies and extensive interpretation of multiple databases. Critical Care Time devoted to patient care services described in this note is 45 minutes.   Luz Brazen, MD Pulmonary and [place] Pager: [phone]   01/14/2017, 4:53 AM

## 2017-01-14 NOTE — Progress Notes (Signed)
ANTICOAGULATION CONSULT NOTE - Initial Consult  Pharmacy Consult for Heparin Indication: pulmonary embolus  No Known Allergies  Patient Measurements: Height: 5\' 3"  (160 cm) Weight: 230 lb (104.3 kg) IBW/kg (Calculated) : 52.4 Heparin Dosing Weight: 77 kg  Vital Signs: Temp: 97.6 F (36.4 C) (01/27 1958) Temp Source: Oral (01/27 1958) BP: 112/78 (01/27 2300) Pulse Rate: 125 (01/27 2300)  Labs:  Recent Labs  01/13/17 2010  HGB 14.7  HCT 46.0  PLT 216  CREATININE 1.39*  TROPONINI <0.03    Estimated Creatinine Clearance: 37.3 mL/min (by C-G formula based on SCr of 1.39 mg/dL (H)).   Medical History: History reviewed. No pertinent past medical history.  Medications:  Prescriptions Prior to Admission  Medication Sig Dispense Refill Last Dose  . aspirin 81 MG chewable tablet Chew 81 mg by mouth daily.   01/13/2017 at Unknown time  . ezetimibe-simvastatin (VYTORIN) 10-40 MG tablet Take 1 tablet by mouth daily.   01/13/2017 at Unknown time  . lisinopril (PRINIVIL,ZESTRIL) 40 MG tablet Take 40 mg by mouth daily.   01/13/2017 at Unknown time  . metoprolol succinate (TOPROL-XL) 100 MG 24 hr tablet Take 100 mg by mouth daily. Take with or immediately following a meal.   01/13/2017 at 0800  . nortriptyline (PAMELOR) 25 MG capsule Take 25 mg by mouth at bedtime.   01/12/2017 at Unknown time  . risperiDONE (RISPERDAL) 0.25 MG tablet Take 0.25 mg by mouth at bedtime.   01/12/2017 at Unknown time  . SitaGLIPtin-MetFORMIN HCl (JANUMET XR) [phone] MG TB24 Take 1 tablet by mouth daily.   01/13/2017 at Unknown time    Assessment: 83 y.o. F presents with SOB. Initially started on abx for possible PNA. CT scan shows positive for acute PE, bilateral and multilobar with CT evidence of right heart strain (RV/LV Ratio = 1.37) consistent with at least submassive (intermediate risk) PE. To start heparin. CBC ok on admission.  Goal of Therapy:  Heparin level 0.3-0.7 units/ml Monitor platelets by  anticoagulation protocol: Yes   Plan:  Heparin IV bolus 4500 units Heparin gtt at 1250 units/hr Will f/u heparin level in 8 hours Daily heparin level and CBC  Sherlon Handing, PharmD, BCPS Clinical pharmacist, pager [phone] 01/14/2017,12:41 AM

## 2017-01-14 NOTE — Progress Notes (Signed)
ANTICOAGULATION CONSULT NOTE - Initial Consult  Pharmacy Consult for Heparin Indication: pulmonary embolus  No Known Allergies  Patient Measurements: Height: 5' 3.5" (161.3 cm) Weight: 211 lb 13.8 oz (96.1 kg) IBW/kg (Calculated) : 53.55 Heparin Dosing Weight: 75.7 kg  Vital Signs: Temp: 97.7 F (36.5 C) (01/28 1100) Temp Source: Oral (01/28 1100) BP: 101/85 (01/28 1100) Pulse Rate: 133 (01/28 1100)  Labs:  Recent Labs  01/13/17 2010 01/14/17 0143 01/14/17 0713 01/14/17 1019  HGB 14.7  --   --  12.2  HCT 46.0  --   --  38.7  PLT 216  --   --  176  HEPARINUNFRC  --   --   --  0.36  CREATININE 1.39*  --   --  1.22*  TROPONINI <0.03 <0.03 <0.03 <0.03    Estimated Creatinine Clearance: 41 mL/min (by C-G formula based on SCr of 1.22 mg/dL (H)).   Medical History: Past Medical History:  Diagnosis Date  . Chronic kidney disease   . Dementia   . Diabetes mellitus without complication ([place])   . Hypertension     Medications:  Prescriptions Prior to Admission  Medication Sig Dispense Refill Last Dose  . aspirin 81 MG chewable tablet Chew 81 mg by mouth daily.   01/13/2017 at Unknown time  . ezetimibe-simvastatin (VYTORIN) 10-40 MG tablet Take 1 tablet by mouth daily.   01/13/2017 at Unknown time  . lisinopril (PRINIVIL,ZESTRIL) 40 MG tablet Take 40 mg by mouth daily.   01/13/2017 at Unknown time  . metoprolol succinate (TOPROL-XL) 100 MG 24 hr tablet Take 100 mg by mouth daily. Take with or immediately following a meal.   01/13/2017 at 0800  . nortriptyline (PAMELOR) 25 MG capsule Take 25 mg by mouth at bedtime.   01/12/2017 at Unknown time  . risperiDONE (RISPERDAL) 0.25 MG tablet Take 0.25 mg by mouth at bedtime.   01/12/2017 at Unknown time  . SitaGLIPtin-MetFORMIN HCl (JANUMET XR) [phone] MG TB24 Take 1 tablet by mouth daily.   01/13/2017 at Unknown time    Assessment: 83 y.o. F presents with SOB. Initially started on abx for possible PNA. CT scan shows positive for  acute PE, bilateral and multilobar with CT evidence of right heart strain (RV/LV Ratio = 1.37) consistent with at least submassive (intermediate risk) PE. Heparin started.  -Hgb 12.2, Plts 176 -HL 0.36 @1000  on 1/28 - therapeutic, but because treating for PE, would prefer to see HL middle to high end of range -No signs of bleeding  Goal of Therapy:  Heparin level 0.3-0.7 units/ml Monitor platelets by anticoagulation protocol: Yes   Plan:  Increase Heparin gtt to 1300 units/hr, no bolus Will f/u heparin level in 8 hours Daily heparin level and CBC Monitor S/Sx bleeding  Myer Peer Grayland Ormond), PharmD  [id number] Pharmacy Resident Pager: [phone] 01/14/2017 12:43 PM

## 2017-01-14 NOTE — Consult Note (Signed)
Cardiology Consult    Patient ID: Catherine Gonzales MRN: [id number], DOB/AGE: [birth year]   Admit date: 01/13/2017 Date of Consult: 01/14/2017  Primary Physician: No primary care provider on file. Reason for Consult: Atrial Flutter Primary Cardiologist: New to [facility] Requesting Provider: Dr. Grandville Silos   History of Present Illness    Catherine Gonzales is a 83 y.o. female with past medical history of HTN, Type 2 DM, CKD, and dementia who presented to [facility] ED on 01/13/2017 for worsening dyspnea.   Was recently treated for PNA/ Bronchitis as an outpatient with minimal improvement in her symptoms. Placed on home O2 two weeks prior. On the day of presentation, she experienced a near syncopal episode and was brought to the ED for further evaluation.   She reports having baseline dyspnea with exertion for the past few years but her symptoms acutely worsened over the past month. Last week, she was vacuuming her floors and reports having to stop multiple times due to her dyspnea. Denies any associated chest discomfort or palpitations.   While in the ED, she was noted to be tachycardiac into the 130's with initial EKG showing atrial flutter. O2 saturations were in the 80's. Labs showed a WBC of 13.9, Hgb 14.7, and platelets 216. K+ 4.6. Creatinine 1.39 (unknown baseline). Initial two troponin values negative. BNP 287. Lactic Acid 3.75, with repeat value at 3.7. Was placed on Vancomycin and Zosyn at that time for possible sepsis.   CXR showed no active disease. CTA showed acute bilateral and multilobar PE with CT evidence of right heart strain (RV/LV Ratio = 1.37) consistent with at least submassive (intermediate risk) PE. An echocardiogram is pending.   She has been started on Heparin and admitted for further observation.   A repeat EKG was obtained this AM and showed 2:1 atrial flutter, HR 135, with lateral ST depression. HR has been in the 120's - 130's on telemetry. She was initially on Cardizem  but unable to tolerate this secondary to hypotension. Cardiology was therefore consulted for assistance with her arrhythmia.   She denies any prior cardiac history. No known arrhythmias. Family history significant for CAD in her sister. She lives by herself, performing ADL's independently. Denies any recent falls. Her two children liver nearby and check on her frequently.    Past Medical History   Past Medical History:  Diagnosis Date  . Chronic kidney disease   . Dementia   . Diabetes mellitus without complication ([place])   . Hypertension     Past Surgical History:  Procedure Laterality Date  . CHOLECYSTECTOMY       Allergies  No Known Allergies  Inpatient Medications    . aspirin  81 mg Oral Daily  . ezetimibe-simvastatin  1 tablet Oral Daily  . insulin aspart  0-15 Units Subcutaneous TID WC  . insulin aspart  0-5 Units Subcutaneous QHS  . nortriptyline  25 mg Oral QHS  . risperiDONE  0.25 mg Oral QHS  . sodium chloride flush  3 mL Intravenous Q12H  . sodium chloride flush  3 mL Intravenous Q12H    Family History    Family History  Problem Relation Age of Onset  . Deep vein thrombosis Neg Hx     Social History    Social History   Social History  . Marital status: Married    Spouse name: N/A  . Number of children: N/A  . Years of education: N/A   Occupational History  . Not on file.  Social History Main Topics  . Smoking status: Never Smoker  . Smokeless tobacco: Never Used  . Alcohol use No  . Drug use: No  . Sexual activity: Not on file   Other Topics Concern  . Not on file   Social History Narrative  . No narrative on file     Review of Systems    General:  No chills, fever, night sweats or weight changes.  Cardiovascular:  No chest pain, edema, orthopnea, palpitations, paroxysmal nocturnal dyspnea. Positive for dyspnea on exertion.  Dermatological: No rash, lesions/masses Respiratory: Positive for cough and dyspnea Urologic: No hematuria,  dysuria Abdominal:   No nausea, vomiting, diarrhea, bright red blood per rectum, melena, or hematemesis Neurologic:  No visual changes, wkns, changes in mental status. All other systems reviewed and are otherwise negative except as noted above.  Physical Exam    Blood pressure 102/72, pulse (!) 133, temperature 97.9 F (36.6 C), temperature source Oral, resp. rate 20, height 5' 3.5" (1.613 m), weight 211 lb 13.8 oz (96.1 kg), SpO2 91 %.  General: Pleasant, elderly Caucasian female appearing in NAD. Psych: Normal affect. Neuro: Alert and oriented X 3. Moves all extremities spontaneously. HEENT: Normal  Neck: Supple without bruits or JVD. Lungs:  Resp labored. CTA without wheezing or rales. Heart: Regular rhythm, tachycardiac rate. No s3, s4, or murmurs. Abdomen: Soft, non-tender, non-distended, BS + x 4.  Extremities: No clubbing, cyanosis or edema. DP/PT/Radials 2+ and equal bilaterally.  Labs    Troponin [facility] Test)  Recent Labs  01/13/17 2023  TROPIPOC 0.00    Recent Labs  01/13/17 2010 01/14/17 0143 01/14/17 0713  TROPONINI <0.03 <0.03 <0.03   Lab Results  Component Value Date   WBC 13.9 (H) 01/13/2017   HGB 14.7 01/13/2017   HCT 46.0 01/13/2017   MCV 102.0 (H) 01/13/2017   PLT 216 01/13/2017    Recent Labs Lab 01/13/17 2010  NA 140  K 4.6  CL 104  CO2 22  BUN 29*  CREATININE 1.39*  CALCIUM 9.2  PROT 6.8  BILITOT 0.4  ALKPHOS 117  ALT 48  AST 63*  GLUCOSE 235*   No results found for: CHOL, HDL, LDLCALC, TRIG No results found for: [facility]   Radiology Studies    Dg Chest 2 View  Result Date: 12/27/2016 CLINICAL DATA:  Shortness of breath. EXAM: CHEST  2 VIEW COMPARISON:  04/24/2013. FINDINGS: Patient is rotated to the right. Mild cardiomegaly with normal pulmonary vascularity. Heart size stable. Mild right base infiltrate cannot be excluded. No pleural effusion or pneumothorax. Degenerative changes thoracic spine . IMPRESSION: 1. Mild right  base infiltrate cannot be excluded. 2.  Stable cardiomegaly. Electronically Signed   By: Marcello Moores  Register   On: 12/27/2016 14:20   Ct Angio Chest Pe W Or Wo Contrast  Result Date: 01/14/2017 CLINICAL DATA:  Acute dyspnea, diaphoresis and near syncope. EXAM: CT ANGIOGRAPHY CHEST WITH CONTRAST TECHNIQUE: Multidetector CT imaging of the chest was performed using the standard protocol during bolus administration of intravenous contrast. Multiplanar CT image reconstructions and MIPs were obtained to evaluate the vascular anatomy. CONTRAST:  80 cc Isovue 370 IV COMPARISON:  CXR from 01/13/2017 FINDINGS: Cardiovascular: Acute bilateral pulmonary emboli starting at the branch point for the right and left pulmonary arteries extending into both lower lobes, left upper lobe, lingula and right middle lobe to the level of the subsegmental branches. RV/LV ratio 1.37 consistent with right heart strain. Borderline cardiomegaly. Aortic atherosclerosis. Aortic atherosclerosis without  dissection. Mediastinum/Nodes: Mild thyromegaly. No discrete mass however there is slight heterogeneous enhancement some which is due to streak artifacts from adjacent ribs. No mediastinal adenopathy. Small hiatal hernia. Lungs/Pleura: No pneumonic consolidation. Bibasilar atelectasis. Trace right pleural effusion. Upper Abdomen: Mild thickening of both adrenal glands. Cholecystectomy. Musculoskeletal: Mid thoracic degenerative disc disease and osteophyte formation consistent with spondylosis. Review of the MIP images confirms the above findings. IMPRESSION: Positive for acute PE, bilateral and multilobar with CT evidence of right heart strain (RV/LV Ratio = 1.37) consistent with at least submassive (intermediate risk) PE. The presence of right heart strain has been associated with an increased risk of morbidity and mortality. Please activate Code PE by paging [phone]. Critical Value/emergent results were called by telephone at the time of  interpretation on 01/14/2017 at 12:15 am to Dr. Mitzi Hansen , who verbally acknowledged these results. Mild thyromegaly. Mild adrenal thickening possibly hyperplastic. Cholecystectomy. Electronically Signed   By: Ashley Royalty M.D.   On: 01/14/2017 00:17   Dg Chest Portable 1 View  Result Date: 01/13/2017 CLINICAL DATA:  Difficulty breathing EXAM: PORTABLE CHEST 1 VIEW COMPARISON:  12/27/2016 FINDINGS: Cardiac shadow is again enlarged but stable. Lungs are well aerated bilaterally. No focal infiltrate or sizable effusion is seen. No bony abnormality is noted. IMPRESSION: No active disease. Electronically Signed   By: Inez Catalina M.D.   On: 01/13/2017 20:44    EKG & Cardiac Imaging    EKG: 2:1 atrial flutter, HR 135, with lateral ST depression.  Echocardiogram: Pending  Assessment & Plan    1. New Onset Atrial Flutter - recently treated as an outpatient for PNA/ Bronchitis, being placed on antibiotics and home O2 two weeks prior. Symptoms of dyspnea at rest and with exertion progressed.  - CTA positive for subacute submassive PE. Did not receive TPA. Has been started on Heparin.  - Initial and repeat EKG's show a regular tachycardia, most consistent with atrial flutter. HR has been in the 120's - 130's on telemetry.  - echocardiogram pending to assess LV function and wall motion.  - was initially on Cardizem but unable to tolerate this secondary to hypotension. Started on IV Amiodarone this morning by the admitting team. Continue with this for now as BP remains labile and limits other rate-controlling medications.   - This patients CHA2DS2-VASc Score and unadjusted Ischemic Stroke Rate (% per year) is equal to 9.7 % stroke rate/year from a score of 6 (Female, HTN, Age (2), TE (2)). Currently on Heparin. Will need to initiate NOAC prior to discharge in the setting of PE and new onset atrial flutter. She denies any recent falls.   2. Subacute Submassive Bilateral PE - CTA showed acute bilateral  and multilobar PE with CT evidence of right heart strain (RV/LV Ratio = 1.37) consistent with at least submassive (intermediate risk) PE.  - Echocardiogram is pending. - remains on Heparin.  - Pulmonology following.   3. SIRS - tachycardiac and tachypnea while in the ED with a leukocytosis (13.9) and elevated lactic acid (3.75, with repeat value at 3.7). Was placed on Vancomycin and Zosyn at that time for possible sepsis. These have since been discontinued. - per admitting team  4. HTN - Now Hypotensive - BP has been 87/44 - 127/85 since admission.  - on Lisinopril and Toprol-XL as an outpatient. Hold for now in the setting of hypotension.   4. CKD - creatinine 1.39 on admission. Unknown baseline. ACE-I on hold.  - repeat BMET in AM  5.  Dementia - A&Ox3 today. Still lives by herself with her two children nearby who check on her daily. No recent falls.   Signed, Erma Heritage, PA-C 01/14/2017, 9:37 AM Pager: [phone] As above, pt seen and examined; briefly pt is an 83 yo female with past medical history of hypertension, diabetes mellitus, chronic kidney disease admitted with acute pulmonary embolus for evaluation of atrial flutter. Over the past 1-2 weeks she complains of progressive dyspnea on exertion, dizziness with activities, weakness. No chest pain or palpitations. Also with episode of syncope. She was admitted and CT scan shows acute pulmonary embolus. Lower extremity Dopplers showed bilateral DVT. Electrocardiogram shows atrial flutter with low voltage.   1 atrial flutter-atrial arrhythmia is likely related to the stress of her recent pulmonary embolus with increased pulmonary pressures and right atrial/right ventricular strain. Her blood pressure is borderline. Would continue amiodarone to help control rate and potentially convert. Add low dose metoprolol. Continue heparin. TSH is normal. Echocardiogram to assess LV function. CHADSvasc 5. Doubt she will require long-term  anticoagulation as arrhythmia likely secondary to stress of pulmonary embolus. However she will likely require long-term anticoagulation given severity of pulmonary embolus and bilateral DVT. There are no clear precipitating factors.  2 pulmonary embolus-continue heparin. Would ultimately change to apixaban. Echocardiogram to assess RV function.  3. Hypertension-blood pressure is borderline. Hold lisinopril.  Kirk Ruths, MD

## 2017-01-14 NOTE — Progress Notes (Signed)
Dr Grandville Silos text paged with  Lower extremities venous Duplex Preliminary report and Lactic Acid results

## 2017-01-14 NOTE — Progress Notes (Signed)
Pt heart rate is still steady standing around the 130's to 140's.  She doesn't wear O2 at home, I move her O2 up to 5L, patient now treading around the same number.  Contact MD on call to make sure that it was ok to still have HR running at in those types of numvers

## 2017-01-15 ENCOUNTER — Telehealth: Payer: Self-pay | Admitting: Cardiology

## 2017-01-15 DIAGNOSIS — I82403 Acute embolism and thrombosis of unspecified deep veins of lower extremity, bilateral: Secondary | ICD-10-CM

## 2017-01-15 DIAGNOSIS — J9601 Acute respiratory failure with hypoxia: Secondary | ICD-10-CM

## 2017-01-15 DIAGNOSIS — I824Y3 Acute embolism and thrombosis of unspecified deep veins of proximal lower extremity, bilateral: Secondary | ICD-10-CM

## 2017-01-15 DIAGNOSIS — N179 Acute kidney failure, unspecified: Secondary | ICD-10-CM

## 2017-01-15 DIAGNOSIS — I483 Typical atrial flutter: Secondary | ICD-10-CM

## 2017-01-15 HISTORY — DX: Acute embolism and thrombosis of unspecified deep veins of lower extremity, bilateral: I82.403

## 2017-01-15 LAB — HEPARIN LEVEL (UNFRACTIONATED): Heparin Unfractionated: 0.33 IU/mL (ref 0.30–0.70)

## 2017-01-15 LAB — GLUCOSE, CAPILLARY
GLUCOSE-CAPILLARY: 165 mg/dL — AB (ref 65–99)
GLUCOSE-CAPILLARY: 161 mg/dL — AB (ref 65–99)
Glucose-Capillary: 178 mg/dL — ABNORMAL HIGH (ref 65–99)
Glucose-Capillary: 148 mg/dL — ABNORMAL HIGH (ref 65–99)

## 2017-01-15 LAB — URINE CULTURE: CULTURE: NO GROWTH

## 2017-01-15 LAB — BASIC METABOLIC PANEL
ANION GAP: 6 (ref 5–15)
BUN: 22 mg/dL — ABNORMAL HIGH (ref 6–20)
CHLORIDE: 112 mmol/L — AB (ref 101–111)
CO2: 20 mmol/L — ABNORMAL LOW (ref 22–32)
Calcium: 8.3 mg/dL — ABNORMAL LOW (ref 8.9–10.3)
Creatinine, Ser: 1.29 mg/dL — ABNORMAL HIGH (ref 0.44–1.00)
GFR calc Af Amer: 44 mL/min — ABNORMAL LOW (ref >60)
GFR calc non Af Amer: 38 mL/min — ABNORMAL LOW (ref >60)
Glucose, Bld: 180 mg/dL — ABNORMAL HIGH (ref 65–99)
POTASSIUM: 5.3 mmol/L — AB (ref 3.5–5.1)
SODIUM: 138 mmol/L (ref 135–145)

## 2017-01-15 LAB — CBC
HEMATOCRIT: 38.7 % (ref 36.0–46.0)
Hemoglobin: 12 g/dL (ref 12.0–15.0)
MCH: 32.4 pg (ref 26.0–34.0)
MCHC: 31 g/dL (ref 30.0–36.0)
MCV: 104.6 fL — AB (ref 78.0–100.0)
Platelets: 158 10*3/uL (ref 150–400)
RBC: 3.7 MIL/uL — ABNORMAL LOW (ref 3.87–5.11)
RDW: 15.3 % (ref 11.5–15.5)
WBC: 10.6 10*3/uL — AB (ref 4.0–10.5)

## 2017-01-15 LAB — MAGNESIUM: MAGNESIUM: 2.2 mg/dL (ref 1.7–2.4)

## 2017-01-15 LAB — HEMOGLOBIN A1C
Hgb A1c MFr Bld: 8.3 % — ABNORMAL HIGH (ref 4.8–5.6)
Mean Plasma Glucose: 192 mg/dL

## 2017-01-15 LAB — LACTIC ACID, PLASMA: LACTIC ACID, VENOUS: 1.9 mmol/L (ref 0.5–1.9)

## 2017-01-15 LAB — POTASSIUM: POTASSIUM: 3.5 mmol/L (ref 3.5–5.1)

## 2017-01-15 MED ORDER — AMIODARONE LOAD VIA INFUSION
150.0000 mg | Freq: Once | INTRAVENOUS | Status: AC
  Administered 2017-01-15: 150 mg via INTRAVENOUS
  Filled 2017-01-15: qty 83.34

## 2017-01-15 MED ORDER — METOPROLOL TARTRATE 25 MG PO TABS
25.0000 mg | ORAL_TABLET | Freq: Once | ORAL | Status: AC
  Administered 2017-01-15: 25 mg via ORAL
  Filled 2017-01-15: qty 1

## 2017-01-15 MED ORDER — METOPROLOL TARTRATE 25 MG PO TABS
25.0000 mg | ORAL_TABLET | Freq: Two times a day (BID) | ORAL | Status: DC
  Administered 2017-01-15 – 2017-01-20 (×10): 25 mg via ORAL
  Filled 2017-01-15 (×10): qty 1

## 2017-01-15 MED ORDER — DEXTROSE 5 % IV SOLN
5.0000 mg/h | INTRAVENOUS | Status: DC
  Administered 2017-01-15: 5 mg/h via INTRAVENOUS
  Filled 2017-01-15 (×2): qty 100

## 2017-01-15 NOTE — Progress Notes (Addendum)
RN attempting to page Cardiology  MD about [place]. Primary Traid MD aware, seen patient during rounding this Am, asked RN to contact Cardiology for follow up.  HR still in high 130s patient in a-flutter. On amio drip. Asymptomatic. Morning meds given, did not affect Hr. Paitent denies symptoms.

## 2017-01-15 NOTE — Telephone Encounter (Signed)
Catherine Gonzales from [facility] is calling and is requesting a call from someone in regards to patient. Please call, thanks.

## 2017-01-15 NOTE — Progress Notes (Signed)
RN paging MD Meda Coffee about BP (DBP over 100) and HR continuing to be  High 130's-low 140s. Patient asymptomatic, denies any pain. Will continue to monitor

## 2017-01-15 NOTE — Progress Notes (Signed)
PROGRESS NOTE    Catherine Gonzales  [id number] DOB: [birth year] DOA: 01/13/2017 PCP: No primary care provider on file.    Brief Narrative:  Patient is a pleasant 83 year old female history of hypertension, type 2 diabetes, dementia, chronic kidney disease with unknown baseline, presenting to the ED with worsening shortness of breath, diaphoresis and near syncopal episode. Patient recently treated with antibiotics in the outpatient setting for probable bronchitis. Patient had worsening dyspnea and noted to be hypoxic and evaluated and noted to have bilateral submassive pulmonary emboli with right ventricular strain and also in atrial flutter with soft blood pressures.PCCM consulted. Will consult with cardiology.   Assessment & Plan:   Principal Problem:   Acute pulmonary embolism (HCC) Active Problems:   DVT of lower extremity, bilateral ([place]): peroneal veins   Acute respiratory failure with hypoxia (HCC)   Atrial flutter (HCC)   Kidney disease   SIRS (systemic inflammatory response syndrome) (HCC)   Memory deficit   Hypertension   Diabetes mellitus type II, non insulin dependent (HCC)   Atrial tachycardia (HCC)   Near syncope   Hypoxia   Leukocytosis  #1 subacute submassive bilateral multilobar pulmonary emboli rate right ventricular strain/acute hypoxic respiratory failure/bilateral peroneal DVT Questionable etiology. Patient denies any recent surgeries or long car rides or family history of DVT. Patient with no current history of neoplasm however states she is due for colonoscopy and mammogram soon. Patient with some improvement with respiratory distress however still in a flutter and tachycardic in the 130s.  CT chest which was done on admission consistent with bilateral multilobar pulmonary emboli with right ventricular strain. 2-D echo with normal EF of 50-55%, normal left ventricle systolic function, D-shaped septum, moderately reduced right ventricular function, moderate TR,  moderately elevated pulmonary pressure. Lower extremity Dopplers positive for bilateral peroneal vein DVTs. Blood pressures improving on IV fluids. Decrease IV fluid rate to 75 mL per hour.  Monitor blood pressure closely. Continue IV heparin. Likely transition to oral  NOAC the next 48-72 hours. Patient has been seen in consultation by PCCM, who feel that patient submassive pulmonary emboli subacute and has been developing over some weeks and a such lytics and EKKOS are not indicated at this time.PCCM following and appreciate input and recommendations.  #2 new onset atrial flutter ( CHA2DS2VASC SCORE = 5) Likely secondary to submassive bilateral multilobar pulmonary emboli. Patient with heart rates in the 130s with some improvement with blood pressure. Patient still with heart rates in the 130s despite IV amiodarone and beta blocker which was started per cardiology. Patient was initially placed on Cardizem however this was discontinued by critical care. Patient with heart rates sustaining in the 130s. Cardiac enzymes negative to date. TSH  2.006.Marland Kitchen 2-D echo with EF of 50-55%, no wall motion abnormalities, the shape septum, moderately reduced RV function, moderate TR, moderately elevated pulmonary pressure. Continue IV amiodarone and oral beta blocker. Patient on IV heparin. Cardiology following and appreciate input and recommendations. Likely transition to one of the oral NOAC (APIXABAN) in the next 48-72 hours with clinical improvement.  #3 near syncope Likely secondary to acute bilateral PE. Patient with no further episodes. Continue treatment for bilateral PE/bilateral DVT with IV heparin. 2-D echo with a EF of 50-55%, no wall motion abnormalities, ventricular septum showed diastolic flattening and systolic flattening, right ventricular systolic function moderately reduced, D-shaped septum, moderate TR, moderately elevated pulmonary pressure. Continue anticoagulation with IV heparin for now. Follow.   #4  systemic inflammatory response syndrome Patient had  presented with a mild leukocytosis, hypoxia, tachypnea, soft blood pressure and elevated lactic acid level. Chest x-ray negative for any acute infiltrate. Urinalysis unremarkable. Patient has been pancultured and results pending. SIRS likely secondary to problem #1. Patient received a dose of IV vancomycin and IV Zosyn in the ED. No need for antibiotics at this time. Follow.  #5 chronic kidney disease Unknown baseline. Patient received contrast for CT angiogram. Lisinopril on hold. Patient on IV fluids. Monitor renal function.  #6 hypertension Systolic blood pressures improving. Continue to hold antihypertensive medications. Decrease IV fluid rate to 75 mL per hour. Follow.  #7 hyperlipidemia Continue Vytorin.  #8 diabetes mellitus Type 2 Hemoglobin A1c 8.3. CBG ranging from 131-172. Continue sliding scale insulin.   #9 dementia with behavioral disturbance Stable. Continue risperdal daily at bedtime.     DVT prophylaxis: Heparin Code Status: Full Family Communication: Updated patient. No family at bedside. Disposition Plan: Home versus SNF once medically stable and bilateral PE has been treated with improvement in respiratory distress and resolution of atrial flutter.   Consultants:   PCCM: Dr Judge Stall 01/14/2017  Cardiology: Dr. Stanford Breed 01/14/2017  Procedures:   CT angiogram chest 01/13/2017  Chest x-ray 01/13/2017  Bilateral lower extremity Dopplers 01/14/2017  2-D echo 01/14/2017  Antimicrobials:   None   Subjective: Patient states some improvement with her breathing and shortness of breath. Patient denies any chest pain. No abdominal pain. Patient still noted to be tachycardic in a flutter with heart rates in the 130s.  Objective: Vitals:   01/14/17 2200 01/14/17 2314 01/15/17 0339 01/15/17 0700  BP: (!) 121/95 103/72 130/88 127/83  Pulse: (!) 135 (!) 133 (!) 136 (!) 139  Resp: (!) 30 19 18  (!) 23  Temp:   97.6 F (36.4 C) 97.7 F (36.5 C) 97.7 F (36.5 C)  TempSrc:  Oral Oral Oral  SpO2: 92% 90% 99% 94%  Weight:      Height:        Intake/Output Summary (Last 24 hours) at 01/15/17 0942 Last data filed at 01/15/17 0400  Gross per 24 hour  Intake          3130.66 ml  Output             1950 ml  Net          1180.66 ml   Filed Weights   01/13/17 2145 01/13/17 2202 01/14/17 0333  Weight: 90.7 kg (200 lb) 104.3 kg (230 lb) 96.1 kg (211 lb 13.8 oz)    Examination:  General exam: Less SOB. Respiratory system: Poor-fair air movement. Less short of breath.   Cardiovascular system: Irregularly irregular. No JVD, murmurs, rubs, gallops or clicks. No pedal edema. Gastrointestinal system: Abdomen is nondistended, soft and nontender. No organomegaly or masses felt. Normal bowel sounds heard. Central nervous system: Alert and oriented. No focal neurological deficits. Extremities: Symmetric 5 x 5 power. Skin: No rashes, lesions or ulcers Psychiatry: Judgement and insight appear normal. Mood & affect appropriate.     Data Reviewed: I have personally reviewed following labs and imaging studies  CBC:  Recent Labs Lab 01/13/17 2010 01/14/17 1019 01/15/17 0440  WBC 13.9* 10.1 10.6*  NEUTROABS 10.2* 6.8  --   HGB 14.7 12.2 12.0  HCT 46.0 38.7 38.7  MCV 102.0* 102.4* 104.6*  PLT 216 176 0000000   Basic Metabolic Panel:  Recent Labs Lab 01/13/17 2010 01/14/17 1019 01/15/17 0440 01/15/17 0807  NA 140 140 138  --   K 4.6 4.2 5.3*  3.5  CL 104 109 112*  --   CO2 22 20* 20*  --   GLUCOSE 235* 237* 180*  --   BUN 29* 24* 22*  --   CREATININE 1.39* 1.22* 1.29*  --   CALCIUM 9.2 7.9* 8.3*  --   MG  --  1.4* 2.2  --    GFR: Estimated Creatinine Clearance: 38.8 mL/min (by C-G formula based on SCr of 1.29 mg/dL (H)). Liver Function Tests:  Recent Labs Lab 01/13/17 2010  AST 63*  ALT 48  ALKPHOS 117  BILITOT 0.4  PROT 6.8  ALBUMIN 3.7   No results for input(s): LIPASE,  AMYLASE in the last 168 hours. No results for input(s): AMMONIA in the last 168 hours. Coagulation Profile: No results for input(s): INR, PROTIME in the last 168 hours. Cardiac Enzymes:  Recent Labs Lab 01/13/17 2010 01/14/17 0143 01/14/17 0713 01/14/17 1019  TROPONINI <0.03 <0.03 <0.03 <0.03   BNP (last 3 results) No results for input(s): PROBNP in the last 8760 hours. HbA1C:  Recent Labs  01/14/17 0143  HGBA1C 8.3*   CBG:  Recent Labs Lab 01/14/17 0833 01/14/17 1135 01/14/17 1710 01/14/17 2058 01/15/17 0824  GLUCAP 143* 225* 172* 131* 165*   Lipid Profile: No results for input(s): CHOL, HDL, LDLCALC, TRIG, CHOLHDL, LDLDIRECT in the last 72 hours. Thyroid Function Tests:  Recent Labs  01/14/17 1019  TSH 2.006   Anemia Panel: No results for input(s): VITAMINB12, FOLATE, FERRITIN, TIBC, IRON, RETICCTPCT in the last 72 hours. Sepsis Labs:  Recent Labs Lab 01/13/17 2025 01/14/17 0143 01/14/17 0713 01/15/17 0440  LATICACIDVEN 3.75* 3.7* 2.3* 1.9    Recent Results (from the past 240 hour(s))  Blood Culture (routine x 2)     Status: None (Preliminary result)   Collection Time: 01/13/17  8:10 PM  Result Value Ref Range Status   Specimen Description BLOOD RIGHT ANTECUBITAL  Final   Special Requests BOTTLES DRAWN AEROBIC AND ANAEROBIC 5CC EA  Final   Culture NO GROWTH < 24 HOURS  Final   Report Status PENDING  Incomplete  Blood Culture (routine x 2)     Status: None (Preliminary result)   Collection Time: 01/13/17  8:17 PM  Result Value Ref Range Status   Specimen Description BLOOD LEFT ARM  Final   Special Requests IN PEDIATRIC BOTTLE 4CC  Final   Culture NO GROWTH < 24 HOURS  Final   Report Status PENDING  Incomplete  MRSA PCR Screening     Status: None   Collection Time: 01/14/17  3:39 AM  Result Value Ref Range Status   MRSA by PCR NEGATIVE NEGATIVE Final    Comment:        The GeneXpert MRSA Assay (FDA approved for NASAL specimens only), is  one component of a comprehensive MRSA colonization surveillance program. It is not intended to diagnose MRSA infection nor to guide or monitor treatment for MRSA infections.          Radiology Studies: Ct Angio Chest Pe W Or Wo Contrast  Result Date: 01/14/2017 CLINICAL DATA:  Acute dyspnea, diaphoresis and near syncope. EXAM: CT ANGIOGRAPHY CHEST WITH CONTRAST TECHNIQUE: Multidetector CT imaging of the chest was performed using the standard protocol during bolus administration of intravenous contrast. Multiplanar CT image reconstructions and MIPs were obtained to evaluate the vascular anatomy. CONTRAST:  80 cc Isovue 370 IV COMPARISON:  CXR from 01/13/2017 FINDINGS: Cardiovascular: Acute bilateral pulmonary emboli starting at the branch point for the right and  left pulmonary arteries extending into both lower lobes, left upper lobe, lingula and right middle lobe to the level of the subsegmental branches. RV/LV ratio 1.37 consistent with right heart strain. Borderline cardiomegaly. Aortic atherosclerosis. Aortic atherosclerosis without dissection. Mediastinum/Nodes: Mild thyromegaly. No discrete mass however there is slight heterogeneous enhancement some which is due to streak artifacts from adjacent ribs. No mediastinal adenopathy. Small hiatal hernia. Lungs/Pleura: No pneumonic consolidation. Bibasilar atelectasis. Trace right pleural effusion. Upper Abdomen: Mild thickening of both adrenal glands. Cholecystectomy. Musculoskeletal: Mid thoracic degenerative disc disease and osteophyte formation consistent with spondylosis. Review of the MIP images confirms the above findings. IMPRESSION: Positive for acute PE, bilateral and multilobar with CT evidence of right heart strain (RV/LV Ratio = 1.37) consistent with at least submassive (intermediate risk) PE. The presence of right heart strain has been associated with an increased risk of morbidity and mortality. Please activate Code PE by paging  [phone]. Critical Value/emergent results were called by telephone at the time of interpretation on 01/14/2017 at 12:15 am to Dr. Mitzi Hansen , who verbally acknowledged these results. Mild thyromegaly. Mild adrenal thickening possibly hyperplastic. Cholecystectomy. Electronically Signed   By: Ashley Royalty M.D.   On: 01/14/2017 00:17   Dg Chest Portable 1 View  Result Date: 01/13/2017 CLINICAL DATA:  Difficulty breathing EXAM: PORTABLE CHEST 1 VIEW COMPARISON:  12/27/2016 FINDINGS: Cardiac shadow is again enlarged but stable. Lungs are well aerated bilaterally. No focal infiltrate or sizable effusion is seen. No bony abnormality is noted. IMPRESSION: No active disease. Electronically Signed   By: Inez Catalina M.D.   On: 01/13/2017 20:44        Scheduled Meds: . aspirin  81 mg Oral Daily  . ezetimibe-simvastatin  1 tablet Oral Daily  . insulin aspart  0-15 Units Subcutaneous TID WC  . insulin aspart  0-5 Units Subcutaneous QHS  . metoprolol tartrate  12.5 mg Oral BID  . nortriptyline  25 mg Oral QHS  . risperiDONE  0.25 mg Oral QHS  . sodium chloride flush  3 mL Intravenous Q12H  . sodium chloride flush  3 mL Intravenous Q12H   Continuous Infusions: . amiodarone 30 mg/hr (01/14/17 2146)  . heparin 1,350 Units/hr (01/14/17 1827)  . sodium chloride 0.9 % 1,000 mL infusion 100 mL/hr at 01/14/17 1933     LOS: 2 days    Time spent: 25 mins    THOMPSON,DANIEL, MD [facility] Pager [phone]  If 7PM-7AM, please contact night-coverage [email or web address] Password [facility] 01/15/2017, 9:42 AM

## 2017-01-15 NOTE — Care Management Note (Addendum)
Case Management Note  Patient Details  Name: Catherine Gonzales MRN: [id number] Date of Birth: [birth year]  Subjective/Objective:    Presents with p.e.  , aflutter, sob, episode of syncope, le dpplers showed bil dvt, , will increase metoprolol, cont heparin drip, will ultimately change to eliquis, also on amiodarone drip,, holding linsinopril.  Patient lives alone, but daughter lives close by,she is on home oxygen with [employer], she has not had [place] services before ,but was interested in maybe having [place] services,  NCM gave her [facility] and a private duty list.  She has a pcp, Serita Grammes, and she has Medicare and [employer], and she has transportation to go home with at [employer].   NCM will cont to follow for dc needs.    1/31 [place], BSN - patient , daughter [name] ,states they prefer [facility] if she needs [facility] services, also prefer a one time a day oral anticoagulation if possible , because [name] helps her in the evening with her meds once a day. Awaiting pt/ot eval.  NCM gave patient the xarelto 30 day savings card, she states she goes to [facility] on [place], they do have xarelto in stock. NCM will cont to follow for dc needs.    2/1 [place], BSN - NCM spoke with patient and daughter, [name].  Informed [name] that per pt/ot eval , they have no rec for patient at this time.  Informed [name] that I gave patient the 30 day savings coupon for xarelto.  [name] states she will be here to see patient today.  Informed her she will be transferring to floor today, not sure what room yet.  NCM will cont to follow for dc needs.              Action/Plan:   Expected Discharge Date:                  Expected Discharge Plan:  [place]  In-House Referral:     Discharge planning Services  CM Consult  Post Acute Care Choice:    Choice offered to:     DME Arranged:    DME Agency:     HH Arranged:    [place] Agency:     Status of Service:  In process, will continue to  follow  If discussed at Long Length of Stay Meetings, dates discussed:    Additional Comments:  Zenon Mayo, RN 01/15/2017, 2:51 PM

## 2017-01-15 NOTE — Progress Notes (Signed)
RN spoke with MD Meda Coffee about BP 158/144  and HR still in130s. MD told RN she would call her right back. RN will await returned call

## 2017-01-15 NOTE — Progress Notes (Signed)
Paged Dr. Aundra Dubin on-call for Cardiology regarding pt's A-flutter (137-142) despite Amio gtt (30mg ), Cardizem gtt (15mg ), & 25mg  PO Lasix. BPs 140s/90s. Orders for Amio bolus and increase of Amio basal rate to 60mg . Pt in not acute distress. Will continue to monitor and assess.

## 2017-01-15 NOTE — Progress Notes (Signed)
Repage to MD about Bp and Hr, MD did not return previous page

## 2017-01-15 NOTE — Progress Notes (Signed)
Patient Name: Catherine Gonzales Date of Encounter: 01/15/2017  Principal Problem:   Acute pulmonary embolism [facility]) Active Problems:   Kidney disease   Acute respiratory failure with hypoxia (HCC)   SIRS (systemic inflammatory response syndrome) (HCC)   Memory deficit   Hypertension   Diabetes mellitus type II, non insulin dependent (HCC)   Atrial tachycardia (HCC)   Near syncope   Hypoxia   Atrial flutter (HCC)   Leukocytosis   DVT of lower extremity, bilateral ([place]): peroneal veins   AKI (acute kidney injury) ([place])   Length of Stay: 2  SUBJECTIVE  The patient states that her SOB has improved today.  CURRENT MEDS . aspirin  81 mg Oral Daily  . ezetimibe-simvastatin  1 tablet Oral Daily  . insulin aspart  0-15 Units Subcutaneous TID WC  . insulin aspart  0-5 Units Subcutaneous QHS  . metoprolol tartrate  12.5 mg Oral BID  . nortriptyline  25 mg Oral QHS  . risperiDONE  0.25 mg Oral QHS  . sodium chloride flush  3 mL Intravenous Q12H  . sodium chloride flush  3 mL Intravenous Q12H   . amiodarone 30 mg/hr (01/14/17 2146)  . heparin 1,350 Units/hr (01/14/17 1827)  . sodium chloride 0.9 % 1,000 mL infusion 75 mL/hr at 01/15/17 1028   OBJECTIVE  Vitals:   01/14/17 2200 01/14/17 2314 01/15/17 0339 01/15/17 0700  BP: (!) 121/95 103/72 130/88 127/83  Pulse: (!) 135 (!) 133 (!) 136 (!) 139  Resp: (!) 30 19 18  (!) 23  Temp:  97.6 F (36.4 C) 97.7 F (36.5 C) 97.7 F (36.5 C)  TempSrc:  Oral Oral Oral  SpO2: 92% 90% 99% 94%  Weight:      Height:        Intake/Output Summary (Last 24 hours) at 01/15/17 1207 Last data filed at 01/15/17 0400  Gross per 24 hour  Intake          3130.66 ml  Output             1950 ml  Net          1180.66 ml   Filed Weights   01/13/17 2145 01/13/17 2202 01/14/17 0333  Weight: 200 lb (90.7 kg) 230 lb (104.3 kg) 211 lb 13.8 oz (96.1 kg)    PHYSICAL EXAM  General: Pleasant, NAD. Neuro: Alert and oriented X 3. Moves all  extremities spontaneously. Psych: Normal affect. HEENT:  Normal  Neck: Supple without bruits or JVD. Lungs:  Resp regular and unlabored, CTA. Heart: RRR no s3, s4, or murmurs. Abdomen: Soft, non-tender, non-distended, BS + x 4.  Extremities: No clubbing, cyanosis or edema. DP/PT/Radials 2+ and equal bilaterally.  Accessory Clinical Findings  CBC  Recent Labs  01/13/17 2010 01/14/17 1019 01/15/17 0440  WBC 13.9* 10.1 10.6*  NEUTROABS 10.2* 6.8  --   HGB 14.7 12.2 12.0  HCT 46.0 38.7 38.7  MCV 102.0* 102.4* 104.6*  PLT 216 176 0000000   Basic Metabolic Panel  Recent Labs  01/14/17 1019 01/15/17 0440 01/15/17 0807  NA 140 138  --   K 4.2 5.3* 3.5  CL 109 112*  --   CO2 20* 20*  --   GLUCOSE 237* 180*  --   BUN 24* 22*  --   CREATININE 1.22* 1.29*  --   CALCIUM 7.9* 8.3*  --   MG 1.4* 2.2  --    Liver Function Tests  Recent Labs  01/13/17 2010  AST  63*  ALT 48  ALKPHOS 117  BILITOT 0.4  PROT 6.8  ALBUMIN 3.7   No results for input(s): LIPASE, AMYLASE in the last 72 hours. Cardiac Enzymes  Recent Labs  01/14/17 0143 01/14/17 0713 01/14/17 1019  TROPONINI <0.03 <0.03 <0.03   BNP Invalid input(s): POCBNP D-Dimer No results for input(s): DDIMER in the last 72 hours. Hemoglobin A1C  Recent Labs  01/14/17 0143  HGBA1C 8.3*   Fasting Lipid Panel No results for input(s): CHOL, HDL, LDLCALC, TRIG, CHOLHDL, LDLDIRECT in the last 72 hours. Thyroid Function Tests  Recent Labs  01/14/17 1019  TSH 2.006    Radiology/Studies  Dg Chest 2 View  Result Date: 12/27/2016 CLINICAL DATA:  Shortness of breath. EXAM: CHEST  2 VIEW COMPARISON:  04/24/2013. FINDINGS: Patient is rotated to the right. Mild cardiomegaly with normal pulmonary vascularity. Heart size stable. Mild right base infiltrate cannot be excluded. No pleural effusion or pneumothorax. Degenerative changes thoracic spine . IMPRESSION: 1. Mild right base infiltrate cannot be excluded. 2.  Stable  cardiomegaly. Electronically Signed   By: Marcello Moores  Register   On: 12/27/2016 14:20   Ct Angio Chest Pe W Or Wo Contrast  Result Date: 01/14/2017 CLINICAL DATA:  Acute dyspnea, diaphoresis and near syncope. EXAM: CT ANGIOGRAPHY CHEST WITH CONTRAST TECHNIQUE: Multidetector CT imaging of the chest was performed using the standard protocol during bolus administration of intravenous contrast. Multiplanar CT image reconstructions and MIPs were obtained to evaluate the vascular anatomy. CONTRAST:  80 cc Isovue 370 IV COMPARISON:  CXR from 01/13/2017 FINDINGS: Cardiovascular: Acute bilateral pulmonary emboli starting at the branch point for the right and left pulmonary arteries extending into both lower lobes, left upper lobe, lingula and right middle lobe to the level of the subsegmental branches. RV/LV ratio 1.37 consistent with right heart strain. Borderline cardiomegaly. Aortic atherosclerosis. Aortic atherosclerosis without dissection. Mediastinum/Nodes: Mild thyromegaly. No discrete mass however there is slight heterogeneous enhancement some which is due to streak artifacts from adjacent ribs. No mediastinal adenopathy. Small hiatal hernia. Lungs/Pleura: No pneumonic consolidation. Bibasilar atelectasis. Trace right pleural effusion. Upper Abdomen: Mild thickening of both adrenal glands. Cholecystectomy. Musculoskeletal: Mid thoracic degenerative disc disease and osteophyte formation consistent with spondylosis. Review of the MIP images confirms the above findings. IMPRESSION: Positive for acute PE, bilateral and multilobar with CT evidence of right heart strain (RV/LV Ratio = 1.37) consistent with at least submassive (intermediate risk) PE. The presence of right heart strain has been associated with an increased risk of morbidity and mortality. Please activate Code PE by paging [phone]. Critical Value/emergent results were called by telephone at the time of interpretation on 01/14/2017 at 12:15 am to Dr.  Mitzi Hansen , who verbally acknowledged these results. Mild thyromegaly. Mild adrenal thickening possibly hyperplastic. Cholecystectomy. Electronically Signed   By: Ashley Royalty M.D.   On: 01/14/2017 00:17   Dg Chest Portable 1 View  Result Date: 01/13/2017 CLINICAL DATA:  Difficulty breathing EXAM: PORTABLE CHEST 1 VIEW COMPARISON:  12/27/2016 FINDINGS: Cardiac shadow is again enlarged but stable. Lungs are well aerated bilaterally. No focal infiltrate or sizable effusion is seen. No bony abnormality is noted. IMPRESSION: No active disease. Electronically Signed   By: Inez Catalina M.D.   On: 01/13/2017 20:44   TELE:     ASSESSMENT AND PLAN  83 yo female with past medical history of hypertension, diabetes mellitus, chronic kidney disease admitted with acute pulmonary embolus for evaluation of atrial flutter. Over the past 1-2 weeks she complains of  progressive dyspnea on exertion, dizziness with activities, weakness. No chest pain or palpitations. Also with episode of syncope. She was admitted and CT scan shows acute pulmonary embolus. Lower extremity Dopplers showed bilateral DVT. Electrocardiogram shows atrial flutter with low voltage.   1 atrial flutter-atrial arrhythmia is likely related to the stress of her recent pulmonary embolus with increased pulmonary pressures and right atrial/right ventricular strain. Her blood pressure is borderline. Would continue amiodarone to help control rate and potentially convert.  She remains in atrial flutter with ventricular rate 135 BPM, I will increase metoprolol to 25 mg po BID, with an additional dose now. Continue heparin. TSH is normal. Echocardiogram to assess LV function. CHADSvasc 5. Doubt she will require long-term anticoagulation as arrhythmia likely secondary to stress of pulmonary embolus. However she will likely require long-term anticoagulation given severity of pulmonary embolus and bilateral DVT. There are no clear precipitating factors.  2  pulmonary embolus-continue heparin. Would ultimately change to apixaban. Echocardiogram to assess RV function.  3. Hypertension-blood pressure is borderline. Hold lisinopril.   Signed, Ena Dawley MD, [facility] 01/15/2017

## 2017-01-15 NOTE — Telephone Encounter (Signed)
Spoke with Valinda Party, nurse from [facility]. She states Dr. Stanford Breed rounded on patient over the weekend and she is in AFlutter with HR in 130s. Advised that she would need to contact cardiology rounding team - MDs Dulcy Fanny or Hochrein for advice on patient who is currently admitted.

## 2017-01-15 NOTE — Progress Notes (Signed)
ANTICOAGULATION CONSULT NOTE  Pharmacy Consult for Heparin Indication: pulmonary embolus  No Known Allergies  Patient Measurements: Height: 5' 3.5" (161.3 cm) Weight: 211 lb 13.8 oz (96.1 kg) IBW/kg (Calculated) : 53.55 Heparin Dosing Weight: 75.7 kg  Vital Signs: Temp: 97.7 F (36.5 C) (01/29 0700) Temp Source: Oral (01/29 0700) BP: 127/83 (01/29 0700) Pulse Rate: 139 (01/29 0700)  Labs:  Recent Labs  01/13/17 2010 01/14/17 0143 01/14/17 0713 01/14/17 1019 01/14/17 2223 01/15/17 0440  HGB 14.7  --   --  12.2  --  12.0  HCT 46.0  --   --  38.7  --  38.7  PLT 216  --   --  176  --  158  HEPARINUNFRC  --   --   --  0.36 0.41 0.33  CREATININE 1.39*  --   --  1.22*  --  1.29*  TROPONINI <0.03 <0.03 <0.03 <0.03  --   --     Estimated Creatinine Clearance: 38.8 mL/min (by C-G formula based on SCr of 1.29 mg/dL (H)).   Assessment: 83 y.o. F presents with SOB. On heparin for bilateral submassive PE with R heart strain (RV/LV 1.37), also bilateral DVT noted in peroneal veins on [place]. Last heparin level was therapeutic at 0.33 this am. CBC stable, no s/s of bleed.  Goal of Therapy:  Heparin level 0.3-0.7 units/ml Monitor platelets by anticoagulation protocol: Yes   Plan:  Continue heparin gtt at 1,350 units/hr Monitor daily heparin level, CBC, s/s of bleed F/U transition to oral anticoagulation  Elenor Quinones, PharmD, BCPS Clinical Pharmacist Pager [phone] 01/15/2017 8:30 AM

## 2017-01-15 NOTE — Progress Notes (Signed)
PULMONARY  / CRITICAL CARE MEDICINE CONSULTATION   Name: Catherine Gonzales MRN: [id number] DOB: Apr 29, 1934    ADMISSION DATE:  01/13/2017 CONSULTATION DATE: January 15, 2017  REQUESTING CLINICIAN: January 15, 2017  PRIMARY SERVICE: Hospitalist  CHIEF COMPLAINT:  dyspnea  BRIEF PATIENT DESCRIPTION: 83 y/o woman with PE  SIGNIFICANT EVENTS / STUDIES:  CT-A chest showing bilateral PEs  LINES / TUBES: PIVs  CULTURES: None  ANTIBIOTICS: None   SUBJECTIVE:  No complaints, doing well.  Breathing improved.  VITAL SIGNS: Temp:  [97.6 F (36.4 C)-98.1 F (36.7 C)] 97.7 F (36.5 C) (01/29 0700) Pulse Rate:  [130-139] 139 (01/29 0700) Resp:  [18-30] 23 (01/29 0700) BP: (95-130)/(52-95) 127/83 (01/29 0700) SpO2:  [90 %-99 %] 94 % (01/29 0700) HEMODYNAMICS:   VENTILATOR SETTINGS:   INTAKE / OUTPUT: Intake/Output      01/28 0701 - 01/29 0700 01/29 0701 - 01/30 0700   P.O. 360    I.V. (mL/kg) 2670.7 (27.8)    Other 1000    IV Piggyback 100    Total Intake(mL/kg) 4130.7 (43)    Urine (mL/kg/hr) 2600 (1.1)    Total Output 2600     Net +1530.7            PHYSICAL EXAMINATION: Constitutional: Adult female, no distress Eyes: conjunctivae normal ENMT: Mucous membranes are moist. Posterior pharynx clear of any exudate or lesions.   Neck: normal, supple, no masses Respiratory: no wheeze, crackles, or rhonchi appreciated. No accessory muscle use. Normal respiratory effort Cardiovascular: Tachy, regular.  Skin is dry. JVP not well-visualized Abdomen: No distension, no tenderness, no masses palpated. Bowel sounds normal.  Musculoskeletal: no clubbing / cyanosis. No joint deformity upper and lower extremities. Normal muscle tone.  Skin: no significant rashes, lesions, ulcers. Warm, dry, well-perfused.   LABS:  CBC  Recent Labs Lab 01/13/17 2010 01/14/17 1019 01/15/17 0440  WBC 13.9* 10.1 10.6*  HGB 14.7 12.2 12.0  HCT 46.0 38.7 38.7  PLT 216 176 158   Coag's No  results for input(s): APTT, INR in the last 168 hours. BMET  Recent Labs Lab 01/13/17 2010 01/14/17 1019 01/15/17 0440 01/15/17 0807  NA 140 140 138  --   K 4.6 4.2 5.3* 3.5  CL 104 109 112*  --   CO2 22 20* 20*  --   BUN 29* 24* 22*  --   CREATININE 1.39* 1.22* 1.29*  --   GLUCOSE 235* 237* 180*  --    Electrolytes  Recent Labs Lab 01/13/17 2010 01/14/17 1019 01/15/17 0440  CALCIUM 9.2 7.9* 8.3*  MG  --  1.4* 2.2   Sepsis Markers  Recent Labs Lab 01/14/17 0143 01/14/17 0713 01/15/17 0440  LATICACIDVEN 3.7* 2.3* 1.9   ABG  Recent Labs Lab 01/13/17 2036  PHART 7.339*  PCO2ART 43.2  PO2ART 108.0   Liver Enzymes  Recent Labs Lab 01/13/17 2010  AST 63*  ALT 48  ALKPHOS 117  BILITOT 0.4  ALBUMIN 3.7   Cardiac Enzymes  Recent Labs Lab 01/14/17 0143 01/14/17 0713 01/14/17 1019  TROPONINI <0.03 <0.03 <0.03   Glucose  Recent Labs Lab 01/14/17 0055 01/14/17 0833 01/14/17 1135 01/14/17 1710 01/14/17 2058 01/15/17 0824  GLUCAP 213* 143* 225* 172* 131* 165*    Imaging Ct Angio Chest Pe W Or Wo Contrast  Result Date: 01/14/2017 CLINICAL DATA:  Acute dyspnea, diaphoresis and near syncope. EXAM: CT ANGIOGRAPHY CHEST WITH CONTRAST TECHNIQUE: Multidetector CT imaging of the chest was performed using the standard  protocol during bolus administration of intravenous contrast. Multiplanar CT image reconstructions and MIPs were obtained to evaluate the vascular anatomy. CONTRAST:  80 cc Isovue 370 IV COMPARISON:  CXR from 01/13/2017 FINDINGS: Cardiovascular: Acute bilateral pulmonary emboli starting at the branch point for the right and left pulmonary arteries extending into both lower lobes, left upper lobe, lingula and right middle lobe to the level of the subsegmental branches. RV/LV ratio 1.37 consistent with right heart strain. Borderline cardiomegaly. Aortic atherosclerosis. Aortic atherosclerosis without dissection. Mediastinum/Nodes: Mild thyromegaly.  No discrete mass however there is slight heterogeneous enhancement some which is due to streak artifacts from adjacent ribs. No mediastinal adenopathy. Small hiatal hernia. Lungs/Pleura: No pneumonic consolidation. Bibasilar atelectasis. Trace right pleural effusion. Upper Abdomen: Mild thickening of both adrenal glands. Cholecystectomy. Musculoskeletal: Mid thoracic degenerative disc disease and osteophyte formation consistent with spondylosis. Review of the MIP images confirms the above findings. IMPRESSION: Positive for acute PE, bilateral and multilobar with CT evidence of right heart strain (RV/LV Ratio = 1.37) consistent with at least submassive (intermediate risk) PE. The presence of right heart strain has been associated with an increased risk of morbidity and mortality. Please activate Code PE by paging [phone]. Critical Value/emergent results were called by telephone at the time of interpretation on 01/14/2017 at 12:15 am to Dr. Mitzi Hansen , who verbally acknowledged these results. Mild thyromegaly. Mild adrenal thickening possibly hyperplastic. Cholecystectomy. Electronically Signed   By: Ashley Royalty M.D.   On: 01/14/2017 00:17   Dg Chest Portable 1 View  Result Date: 01/13/2017 CLINICAL DATA:  Difficulty breathing EXAM: PORTABLE CHEST 1 VIEW COMPARISON:  12/27/2016 FINDINGS: Cardiac shadow is again enlarged but stable. Lungs are well aerated bilaterally. No focal infiltrate or sizable effusion is seen. No bony abnormality is noted. IMPRESSION: No active disease. Electronically Signed   By: Inez Catalina M.D.   On: 01/13/2017 20:44    EKG: Sinus tachycardia, possible flutter CXR: Chest CT shows bilateral pulmonary emboli and normal lung parnchymea  Echo 1/28 > EF 50-55%, PAP 51.  ASSESSMENT / PLAN:  Submassive Pulmonary Embolii, subacute. DVT b/l peroneal veins. Plan: Continue heparin gtt. Transition to oral agent.  A.flutter with RVR. Plan: Continue heparin. Rate control per  cards.   TODAY'S SUMMARY: 83 y/o woman with submassive subacute PE.  Tolerating heparin well, needs transition to oral agent.  Nothing further to add.  PCCM will sign off.  Please do not hesitate to call us back if we can be of any further assistance.  Montey Hora, [facility] Pager: [phone]  or [phone] 01/15/2017, 8:56 AM    ATTENDING NOTE / ATTESTATION NOTE :   I have discussed the case with the resident/APP  Montey Hora PA  I agree with the resident/APP's  history, physical examination, assessment, and plans.    I have edited the above note and modified it according to our agreed history, physical examination, assessment and plan.   Briefly, pt admitted with SOB which was slow to improve.  She has baseline mild dementia. Functional at home, sedentary last 4 weeks 2/2 SOB. Chest CTA was (+) for B filling defects.  Currently on heparin drip.   Vitals:  Vitals:   01/14/17 2200 01/14/17 2314 01/15/17 0339 01/15/17 0700  BP: (!) 121/95 103/72 130/88 127/83  Pulse: (!) 135 (!) 133 (!) 136 (!) 139  Resp: (!) 30 19 18  (!) 23  Temp:  97.6 F (36.4  C) 97.7 F (36.5 C) 97.7 F (36.5 C)  TempSrc:  Oral Oral Oral  SpO2: 92% 90% 99% 94%  Weight:      Height:        Constitutional/General:  Pleasant, well-nourished, well-developed, not in any distress,  Comfortably seating.  Well kempt  Body mass index is 36.94 kg/m. Wt Readings from Last 3 Encounters:  01/14/17 96.1 kg (211 lb 13.8 oz)    HEENT: Pupils equal and reactive to light and accommodation. Anicteric sclerae. Normal nasal mucosa.   No oral  lesions,  mouth clear,  oropharynx clear, no postnasal drip. (-) Oral thrush. No dental caries.   Neck: No masses. Midline trachea. No JVD, (-) LAD. (-) bruits appreciated. Airway class IV.   Respiratory/Chest: Grossly normal chest. (-) deformity. (-) Accessory muscle use.  Symmetric expansion. (-) Tenderness on palpation.   Resonant on percussion.  Diminished BS on both lower lung zones. (-) wheezing,rhonchi. Some bibasilar crackles/  (-) egophony  Cardiovascular: Regular rate and  rhythm, heart sounds normal, no murmur or gallops, no peripheral edema  Gastrointestinal:  Normal bowel sounds. Soft, non-tender. No hepatosplenomegaly.  (-) masses.   Musculoskeletal:  Normal muscle tone. Normal gait.   Extremities: Grossly normal. (-) clubbing, cyanosis.  (-) edema  Skin: (-) rash,lesions seen.   Neurological/Psychiatric : alert, oriented to time, place, person. Normal mood and affect  CBC Recent Labs     01/13/17  2010  01/14/17  1019  01/15/17  0440  WBC  13.9*  10.1  10.6*  HGB  14.7  12.2  12.0  HCT  46.0  38.7  38.7  PLT  216  176  158    Coag's No results for input(s): APTT, INR in the last 72 hours.  BMET Recent Labs     01/13/17  2010  01/14/17  1019  01/15/17  0440  01/15/17  0807  NA  140  140  138   --   K  4.6  4.2  5.3*  3.5  CL  104  109  112*   --   CO2  22  20*  20*   --   BUN  29*  24*  22*   --   CREATININE  1.39*  1.22*  1.29*   --   GLUCOSE  235*  237*  180*   --     Electrolytes Recent Labs     01/13/17  2010  01/14/17  1019  01/15/17  0440  CALCIUM  9.2  7.9*  8.3*  MG   --   1.4*  2.2    Sepsis Markers No results for input(s): PROCALCITON, O2SATVEN in the last 72 hours.  Invalid input(s): LACTICACIDVEN  ABG Recent Labs     01/13/17  2036  PHART  7.339*  PCO2ART  43.2  PO2ART  108.0    Liver Enzymes Recent Labs     01/13/17  2010  AST  63*  ALT  48  ALKPHOS  117  BILITOT  0.4  ALBUMIN  3.7    Cardiac Enzymes Recent Labs     01/14/17  0143  01/14/17  0713  01/14/17  1019  TROPONINI  <0.03  <0.03  <0.03    Glucose Recent Labs     01/14/17  0833  01/14/17  1135  01/14/17  1710  01/14/17  2058  01/15/17  0824  01/15/17  1231  GLUCAP  143*  225*  172*  131*  165*  178*  Imaging Ct Angio Chest Pe W Or Wo  Contrast  Result Date: 01/14/2017 CLINICAL DATA:  Acute dyspnea, diaphoresis and near syncope. EXAM: CT ANGIOGRAPHY CHEST WITH CONTRAST TECHNIQUE: Multidetector CT imaging of the chest was performed using the standard protocol during bolus administration of intravenous contrast. Multiplanar CT image reconstructions and MIPs were obtained to evaluate the vascular anatomy. CONTRAST:  80 cc Isovue 370 IV COMPARISON:  CXR from 01/13/2017 FINDINGS: Cardiovascular: Acute bilateral pulmonary emboli starting at the branch point for the right and left pulmonary arteries extending into both lower lobes, left upper lobe, lingula and right middle lobe to the level of the subsegmental branches. RV/LV ratio 1.37 consistent with right heart strain. Borderline cardiomegaly. Aortic atherosclerosis. Aortic atherosclerosis without dissection. Mediastinum/Nodes: Mild thyromegaly. No discrete mass however there is slight heterogeneous enhancement some which is due to streak artifacts from adjacent ribs. No mediastinal adenopathy. Small hiatal hernia. Lungs/Pleura: No pneumonic consolidation. Bibasilar atelectasis. Trace right pleural effusion. Upper Abdomen: Mild thickening of both adrenal glands. Cholecystectomy. Musculoskeletal: Mid thoracic degenerative disc disease and osteophyte formation consistent with spondylosis. Review of the MIP images confirms the above findings. IMPRESSION: Positive for acute PE, bilateral and multilobar with CT evidence of right heart strain (RV/LV Ratio = 1.37) consistent with at least submassive (intermediate risk) PE. The presence of right heart strain has been associated with an increased risk of morbidity and mortality. Please activate Code PE by paging [phone]. Critical Value/emergent results were called by telephone at the time of interpretation on 01/14/2017 at 12:15 am to Dr. Mitzi Hansen , who verbally acknowledged these results. Mild thyromegaly. Mild adrenal thickening possibly  hyperplastic. Cholecystectomy. Electronically Signed   By: Ashley Royalty M.D.   On: 01/14/2017 00:17   Dg Chest Portable 1 View  Result Date: 01/13/2017 CLINICAL DATA:  Difficulty breathing EXAM: PORTABLE CHEST 1 VIEW COMPARISON:  12/27/2016 FINDINGS: Cardiac shadow is again enlarged but stable. Lungs are well aerated bilaterally. No focal infiltrate or sizable effusion is seen. No bony abnormality is noted. IMPRESSION: No active disease. Electronically Signed   By: Inez Catalina M.D.   On: 01/13/2017 20:44   Assessment/Plan : Acute / Subacute Pulmonary embolism.  Haemodynamically stable.  Continue heparin drip.  Transition to PO OAC. Assess risk vs benefit.  Pt has mild baseline dementia and I am not sure if she is a fall risk. Denies fall.  Likely will be on chronic OAC 2/2 atrial flutter as well.  No indication for lytics at this point. Titrate o2 to keep O2 sats > 88%.   Pt has a crowded airway and has snoring, fatigue.  May have underlying OSA. Needs PSG as an outpt.   PCCM will sign off. Call back if with issues.    Family : Plan d/w pt and son in law.     Monica Becton, MD 01/15/2017, 1:29 PM [facility] Pager [phone] After 3 pm or if no answer, call [phone]

## 2017-01-16 ENCOUNTER — Inpatient Hospital Stay (HOSPITAL_COMMUNITY): Payer: Medicare Other

## 2017-01-16 DIAGNOSIS — I471 Supraventricular tachycardia: Secondary | ICD-10-CM

## 2017-01-16 DIAGNOSIS — J810 Acute pulmonary edema: Secondary | ICD-10-CM | POA: Diagnosis present

## 2017-01-16 LAB — URINALYSIS, ROUTINE W REFLEX MICROSCOPIC
Bilirubin Urine: NEGATIVE
Glucose, UA: NEGATIVE mg/dL
HGB URINE DIPSTICK: NEGATIVE
KETONES UR: NEGATIVE mg/dL
LEUKOCYTES UA: NEGATIVE
Nitrite: NEGATIVE
PROTEIN: NEGATIVE mg/dL
Specific Gravity, Urine: 1.013 (ref 1.005–1.030)
pH: 5 (ref 5.0–8.0)

## 2017-01-16 LAB — CBC
HCT: 37.8 % (ref 36.0–46.0)
Hemoglobin: 11.7 g/dL — ABNORMAL LOW (ref 12.0–15.0)
MCH: 31.5 pg (ref 26.0–34.0)
MCHC: 31 g/dL (ref 30.0–36.0)
MCV: 101.6 fL — ABNORMAL HIGH (ref 78.0–100.0)
PLATELETS: 184 10*3/uL (ref 150–400)
RBC: 3.72 MIL/uL — ABNORMAL LOW (ref 3.87–5.11)
RDW: 15.4 % (ref 11.5–15.5)
WBC: 10.7 10*3/uL — AB (ref 4.0–10.5)

## 2017-01-16 LAB — GLUCOSE, CAPILLARY
GLUCOSE-CAPILLARY: 260 mg/dL — AB (ref 65–99)
GLUCOSE-CAPILLARY: 189 mg/dL — AB (ref 65–99)
Glucose-Capillary: 207 mg/dL — ABNORMAL HIGH (ref 65–99)
Glucose-Capillary: 277 mg/dL — ABNORMAL HIGH (ref 65–99)
Glucose-Capillary: 191 mg/dL — ABNORMAL HIGH (ref 65–99)

## 2017-01-16 LAB — BASIC METABOLIC PANEL
Anion gap: 9 (ref 5–15)
BUN: 17 mg/dL (ref 6–20)
CALCIUM: 8.6 mg/dL — AB (ref 8.9–10.3)
CO2: 23 mmol/L (ref 22–32)
CREATININE: 1.11 mg/dL — AB (ref 0.44–1.00)
Chloride: 108 mmol/L (ref 101–111)
GFR, EST AFRICAN AMERICAN: 53 mL/min — AB (ref >60)
GFR, EST NON AFRICAN AMERICAN: 46 mL/min — AB (ref >60)
Glucose, Bld: 189 mg/dL — ABNORMAL HIGH (ref 65–99)
Potassium: 3.7 mmol/L (ref 3.5–5.1)
SODIUM: 140 mmol/L (ref 135–145)

## 2017-01-16 LAB — BLOOD GAS, ARTERIAL
ACID-BASE DEFICIT: 1.9 mmol/L (ref 0.0–2.0)
BICARBONATE: 21.4 mmol/L (ref 20.0–28.0)
DRAWN BY: 44898
O2 Content: 6 L/min
O2 Saturation: 91 %
PCO2 ART: 30.7 mmHg — AB (ref 32.0–48.0)
Patient temperature: 98.1
pH, Arterial: 7.457 — ABNORMAL HIGH (ref 7.350–7.450)
pO2, Arterial: 59.3 mmHg — ABNORMAL LOW (ref 83.0–108.0)

## 2017-01-16 LAB — HEPARIN LEVEL (UNFRACTIONATED)
HEPARIN UNFRACTIONATED: 0.34 [IU]/mL (ref 0.30–0.70)
Heparin Unfractionated: 0.23 IU/mL — ABNORMAL LOW (ref 0.30–0.70)

## 2017-01-16 MED ORDER — FUROSEMIDE 10 MG/ML IJ SOLN
40.0000 mg | Freq: Two times a day (BID) | INTRAMUSCULAR | Status: DC
  Administered 2017-01-16 – 2017-01-18 (×5): 40 mg via INTRAVENOUS
  Filled 2017-01-16 (×5): qty 4

## 2017-01-16 MED ORDER — DILTIAZEM HCL-DEXTROSE 100-5 MG/100ML-% IV SOLN (PREMIX)
5.0000 mg/h | INTRAVENOUS | Status: DC
  Administered 2017-01-16: 20 mg/h via INTRAVENOUS
  Administered 2017-01-16 (×2): 5 mg/h via INTRAVENOUS
  Filled 2017-01-16 (×2): qty 100

## 2017-01-16 MED ORDER — FUROSEMIDE 10 MG/ML IJ SOLN
40.0000 mg | Freq: Once | INTRAMUSCULAR | Status: AC
  Administered 2017-01-16: 40 mg via INTRAVENOUS
  Filled 2017-01-16: qty 4

## 2017-01-16 MED ORDER — HEPARIN BOLUS VIA INFUSION
1000.0000 [IU] | Freq: Once | INTRAVENOUS | Status: AC
  Administered 2017-01-16: 1000 [IU] via INTRAVENOUS
  Filled 2017-01-16: qty 1000

## 2017-01-16 MED ORDER — FUROSEMIDE 10 MG/ML IJ SOLN: 40.0000 mg | Freq: Once | INTRAMUSCULAR | Status: DC

## 2017-01-16 MED ORDER — FUROSEMIDE 10 MG/ML IJ SOLN: 20.0000 mg | Freq: Once | INTRAMUSCULAR | Status: DC

## 2017-01-16 MED ORDER — LORAZEPAM 2 MG/ML IJ SOLN
1.0000 mg | Freq: Once | INTRAMUSCULAR | Status: AC
  Administered 2017-01-16: 1 mg via INTRAVENOUS
  Filled 2017-01-16: qty 1

## 2017-01-16 MED ORDER — POTASSIUM CHLORIDE CRYS ER 20 MEQ PO TBCR
20.0000 meq | EXTENDED_RELEASE_TABLET | Freq: Once | ORAL | Status: AC
  Administered 2017-01-16: 20 meq via ORAL
  Filled 2017-01-16: qty 1

## 2017-01-16 MED ORDER — FUROSEMIDE 10 MG/ML IJ SOLN
20.0000 mg | Freq: Once | INTRAMUSCULAR | Status: AC
  Administered 2017-01-16: 20 mg via INTRAVENOUS
  Filled 2017-01-16: qty 2

## 2017-01-16 NOTE — Progress Notes (Signed)
PROGRESS NOTE    Catherine Gonzales  [id number] DOB: June 17, 1934 DOA: 01/13/2017 PCP: No primary care provider on file.    Brief Narrative:  Patient is a pleasant 83 year old female history of hypertension, type 2 diabetes, dementia, chronic kidney disease with unknown baseline, presenting to the ED with worsening shortness of breath, diaphoresis and near syncopal episode. Patient recently treated with antibiotics in the outpatient setting for probable bronchitis. Patient had worsening dyspnea and noted to be hypoxic and evaluated and noted to have bilateral submassive pulmonary emboli with right ventricular strain and also in atrial flutter with soft blood pressures.PCCM consulted. Will consult with cardiology.   Assessment & Plan:   Principal Problem:   Pulmonary embolus (HCC) Active Problems:   DVT of lower extremity, bilateral ([place]): peroneal veins   Acute respiratory failure with hypoxia (HCC)   Atrial flutter (HCC)   Kidney disease   SIRS (systemic inflammatory response syndrome) (HCC)   Memory deficit   Hypertension   Diabetes mellitus type II, non insulin dependent (HCC)   Atrial tachycardia (HCC)   Near syncope   Hypoxia   Leukocytosis   AKI (acute kidney injury) ([place])  #1 subacute submassive bilateral multilobar pulmonary emboli rate right ventricular strain/acute hypoxic respiratory failure/bilateral peroneal DVT Questionable etiology. Patient denies any recent surgeries or long car rides or family history of DVT. Patient with no current history of neoplasm however states she is due for colonoscopy and mammogram soon. Patient with some improvement with respiratory distress however still in a flutter and tachycardic in the 130s.  CT chest which was done on admission consistent with bilateral multilobar pulmonary emboli with right ventricular strain. 2-D echo with normal EF of 50-55%, normal left ventricle systolic function, D-shaped septum, moderately reduced right  ventricular function, moderate TR, moderately elevated pulmonary pressure. Lower extremity Dopplers positive for bilateral peroneal vein DVTs. Blood pressures improving on IV fluids.  Patient with increasing O2 requirements and currently on high flow nasal cannula at 8 L. Patient with some scattered crackles noted on examination. Saline lock IV fluids. Check a chest x-ray. Check ABG. Lasix 20 minute grams IV 1 and monitor blood pressure closely due to bilateral PE with right ventricular strain. Continue IV heparin. Likely transition to oral  NOAC the next few days. Patient has been seen in consultation by PCCM, who feel that patient submassive pulmonary emboli subacute and has been developing over some weeks and as such lytics and EKKOS are not indicated at this time. Will have critical care medicine reassess patient as patient is having increased oxygen requirements and currently on high flow nasal cannula at 8 L.  #2 new onset atrial flutter ( CHA2DS2VASC SCORE = 5) Likely secondary to submassive bilateral multilobar pulmonary emboli. Patient with heart rates in the 130s with some improvement with blood pressure. Patient still with heart rates in the 130s despite IV amiodarone and beta blocker, and maximum dose IV Cardizem drip which was started per cardiology. Patient was initially placed on Cardizem however this was discontinued by critical care. Patient with heart rates sustaining in the 130s. Cardiac enzymes negative to date. TSH  2.006.Marland Kitchen 2-D echo with EF of 50-55%, no wall motion abnormalities, the shape septum, moderately reduced RV function, moderate TR, moderately elevated pulmonary pressure. Continue IV amiodarone and oral beta blocker. Patient on IV heparin. Cardiology following and appreciate input and recommendations. Likely transition to one of the oral NOAC (APIXABAN) in the next few days.   #3 near syncope Likely secondary to acute  bilateral PE. Patient with no further episodes. Continue  treatment for bilateral PE/bilateral DVT with IV heparin. 2-D echo with a EF of 50-55%, no wall motion abnormalities, ventricular septum showed diastolic flattening and systolic flattening, right ventricular systolic function moderately reduced, D-shaped septum, moderate TR, moderately elevated pulmonary pressure. Continue anticoagulation with IV heparin for now. Follow.   #4 systemic inflammatory response syndrome Patient had presented with a mild leukocytosis, hypoxia, tachypnea, soft blood pressure and elevated lactic acid level. Chest x-ray negative for any acute infiltrate. Urinalysis unremarkable. Patient has been pancultured and results pending. SIRS likely secondary to problem #1. Patient received a dose of IV vancomycin and IV Zosyn in the ED. No need for antibiotics at this time. Follow.  #5 chronic kidney disease Unknown baseline. Patient received contrast for CT angiogram. Lisinopril on hold. Patient on IV fluids. Renal function stable. Monitor renal function.  #6 hypertension Systolic blood pressures improving. Continue to hold antihypertensive medications. Saline lock IV fluids. Follow.  #7 hyperlipidemia Continue Vytorin.  #8 diabetes mellitus Type 2 Hemoglobin A1c 8.3. CBG ranging from 161-207. Continue sliding scale insulin.   #9 dementia with behavioral disturbance Stable. Continue risperdal daily at bedtime.     DVT prophylaxis: Heparin Code Status: Full Family Communication: Updated patient. No family at bedside. Disposition Plan: Home versus SNF once medically stable and bilateral PE has been treated with improvement in respiratory distress and resolution of atrial flutter.   Consultants:   PCCM: Dr Judge Stall 01/14/2017  Cardiology: Dr. Stanford Breed 01/14/2017  Procedures:   CT angiogram chest 01/13/2017  Chest x-ray 01/13/2017  Bilateral lower extremity Dopplers 01/14/2017  2-D echo 01/14/2017  Antimicrobials:   None   Subjective: Patient states some  improvement with her breathing and shortness of breath. Patient denies any chest pain. No abdominal pain. Patient still noted to be tachycardic in atrial flutter with heart rates in the 130s. Patient with increasing O2 requirements per nursing currently on high flow nasal cannula at 8 L.  Objective: Vitals:   01/16/17 0530 01/16/17 0544 01/16/17 0600 01/16/17 0608  BP: 131/86  (!) 121/93   Pulse: (!) 134 89 (!) 135 79  Resp: (!) 24 20 (!) 23 (!) 25  Temp:      TempSrc:      SpO2: 90% 90% (!) 89% 91%  Weight:      Height:        Intake/Output Summary (Last 24 hours) at 01/16/17 1002 Last data filed at 01/16/17 0650  Gross per 24 hour  Intake          3529.86 ml  Output             1375 ml  Net          2154.86 ml   Filed Weights   01/13/17 2202 01/14/17 0333 01/16/17 0500  Weight: 104.3 kg (230 lb) 96.1 kg (211 lb 13.8 oz) 102.4 kg (225 lb 12 oz)    Examination:  General exam: Less SOB. Respiratory system: Poor-fair air movement. SOB. ON 8L High flow Cardiovascular system: Tachycardia. No JVD, murmurs, rubs, gallops or clicks. No pedal edema. Gastrointestinal system: Abdomen is nondistended, soft and nontender. No organomegaly or masses felt. Normal bowel sounds heard. Central nervous system: Alert and oriented. No focal neurological deficits. Extremities: Symmetric 5 x 5 power. Skin: No rashes, lesions or ulcers Psychiatry: Judgement and insight appear normal. Mood & affect appropriate.     Data Reviewed: I have personally reviewed following labs and imaging studies  CBC:  Recent Labs Lab 01/13/17 2010 01/14/17 1019 01/15/17 0440 01/16/17 0230  WBC 13.9* 10.1 10.6* 10.7*  NEUTROABS 10.2* 6.8  --   --   HGB 14.7 12.2 12.0 11.7*  HCT 46.0 38.7 38.7 37.8  MCV 102.0* 102.4* 104.6* 101.6*  PLT 216 176 158 [id number]   Basic Metabolic Panel:  Recent Labs Lab 01/13/17 2010 01/14/17 1019 01/15/17 0440 01/15/17 0807 01/16/17 0230  NA 140 140 138  --  140  K 4.6 4.2  5.3* 3.5 3.7  CL 104 109 112*  --  108  CO2 22 20* 20*  --  23  GLUCOSE 235* 237* 180*  --  189*  BUN 29* 24* 22*  --  17  CREATININE 1.39* 1.22* 1.29*  --  1.11*  CALCIUM 9.2 7.9* 8.3*  --  8.6*  MG  --  1.4* 2.2  --   --    GFR: Estimated Creatinine Clearance: 46.6 mL/min (by C-G formula based on SCr of 1.11 mg/dL (H)). Liver Function Tests:  Recent Labs Lab 01/13/17 2010  AST 63*  ALT 48  ALKPHOS 117  BILITOT 0.4  PROT 6.8  ALBUMIN 3.7   No results for input(s): LIPASE, AMYLASE in the last 168 hours. No results for input(s): AMMONIA in the last 168 hours. Coagulation Profile: No results for input(s): INR, PROTIME in the last 168 hours. Cardiac Enzymes:  Recent Labs Lab 01/13/17 2010 01/14/17 0143 01/14/17 0713 01/14/17 1019  TROPONINI <0.03 <0.03 <0.03 <0.03   BNP (last 3 results) No results for input(s): PROBNP in the last 8760 hours. HbA1C:  Recent Labs  01/14/17 0143  HGBA1C 8.3*   CBG:  Recent Labs Lab 01/15/17 0824 01/15/17 1231 01/15/17 1724 01/15/17 2108 01/16/17 0749  GLUCAP 165* 178* 148* 161* 207*   Lipid Profile: No results for input(s): CHOL, HDL, LDLCALC, TRIG, CHOLHDL, LDLDIRECT in the last 72 hours. Thyroid Function Tests:  Recent Labs  01/14/17 1019  TSH 2.006   Anemia Panel: No results for input(s): VITAMINB12, FOLATE, FERRITIN, TIBC, IRON, RETICCTPCT in the last 72 hours. Sepsis Labs:  Recent Labs Lab 01/13/17 2025 01/14/17 0143 01/14/17 0713 01/15/17 0440  LATICACIDVEN 3.75* 3.7* 2.3* 1.9    Recent Results (from the past 240 hour(s))  Blood Culture (routine x 2)     Status: None (Preliminary result)   Collection Time: 01/13/17  8:10 PM  Result Value Ref Range Status   Specimen Description BLOOD RIGHT ANTECUBITAL  Final   Special Requests BOTTLES DRAWN AEROBIC AND ANAEROBIC 5CC EA  Final   Culture NO GROWTH 2 DAYS  Final   Report Status PENDING  Incomplete  Blood Culture (routine x 2)     Status: None  (Preliminary result)   Collection Time: 01/13/17  8:17 PM  Result Value Ref Range Status   Specimen Description BLOOD LEFT ARM  Final   Special Requests IN PEDIATRIC BOTTLE 4CC  Final   Culture NO GROWTH 2 DAYS  Final   Report Status PENDING  Incomplete  Urine culture     Status: None   Collection Time: 01/13/17  9:31 PM  Result Value Ref Range Status   Specimen Description URINE, CATHETERIZED  Final   Special Requests NONE  Final   Culture NO GROWTH  Final   Report Status 01/15/2017 FINAL  Final  MRSA PCR Screening     Status: None   Collection Time: 01/14/17  3:39 AM  Result Value Ref Range Status   MRSA by PCR NEGATIVE NEGATIVE Final  Comment:        The GeneXpert MRSA Assay (FDA approved for NASAL specimens only), is one component of a comprehensive MRSA colonization surveillance program. It is not intended to diagnose MRSA infection nor to guide or monitor treatment for MRSA infections.          Radiology Studies: Dg Chest Port 1 View  Result Date: 01/16/2017 CLINICAL DATA:  Dyspnea, history of atrial flutter, diabetes, chronic renal insufficiency, nonsmoker EXAM: PORTABLE CHEST 1 VIEW COMPARISON:  Chest x-ray of January 13, 2017 FINDINGS: The lungs are adequately inflated. The interstitial markings have become more conspicuous bilaterally. There is mild pulmonary vascular congestion. The cardiac silhouette is enlarged. There is tortuosity of the ascending and descending thoracic aorta. There is calcification in the wall of the aortic arch. There is no pleural effusion. The observed bony thorax is unremarkable. IMPRESSION: Mild pulmonary interstitial edema likely due to mild CHF. No alveolar pneumonia nor significant pleural effusion. Thoracic aortic atherosclerosis. Electronically Signed   By: David  Martinique M.D.   On: 01/16/2017 09:42        Scheduled Meds: . aspirin  81 mg Oral Daily  . ezetimibe-simvastatin  1 tablet Oral Daily  . insulin aspart  0-15 Units  Subcutaneous TID WC  . insulin aspart  0-5 Units Subcutaneous QHS  . metoprolol tartrate  25 mg Oral BID  . nortriptyline  25 mg Oral QHS  . risperiDONE  0.25 mg Oral QHS  . sodium chloride flush  3 mL Intravenous Q12H   Continuous Infusions: . amiodarone 60 mg/hr (01/16/17 0651)  . dilTIAZem HCl-Dextrose 20 mg/hr (01/16/17 0651)  . heparin 1,450 Units/hr (01/16/17 0943)     LOS: 3 days    Time spent: 49 mins    Koal Eslinger, MD [facility] Pager [phone]  If 7PM-7AM, please contact night-coverage [email or web address] Password [email or web address] 01/16/2017, 10:02 AM

## 2017-01-16 NOTE — Progress Notes (Signed)
MD Meda Coffee paged from  Cardiology for new parameters on amio and dilt drip and to alert that patient still in a -flutter (confirmed with tele monitors) but HR on 80s   (currently 87). BP still highs (roughly 140s/110s)

## 2017-01-16 NOTE — Progress Notes (Signed)
Talked to [facility], gave update about HR and Bp. PA wants RN to leave amio drip at current rate. RN will continue to monitor.  PA said she will come see the patient

## 2017-01-16 NOTE — Progress Notes (Signed)
RN calling PA Barrett. [facility] tele called to say patient in Sr, heart rate in 29s, RN went to tele and it does appear to be SR. RN paging to see if EKG is wanted to confirm. Patient still on dilt and Amio drip.

## 2017-01-16 NOTE — Progress Notes (Addendum)
Progress Note  Patient Name: Catherine Gonzales Date of Encounter: 01/16/2017  Primary Cardiologist: Dr. Stanford Breed  Subjective   Patient is short of breath and is concerned about her current health state.  Inpatient Medications    Scheduled Meds: . aspirin  81 mg Oral Daily  . ezetimibe-simvastatin  1 tablet Oral Daily  . insulin aspart  0-15 Units Subcutaneous TID WC  . insulin aspart  0-5 Units Subcutaneous QHS  . metoprolol tartrate  25 mg Oral BID  . nortriptyline  25 mg Oral QHS  . risperiDONE  0.25 mg Oral QHS  . sodium chloride flush  3 mL Intravenous Q12H   Continuous Infusions: . amiodarone 60 mg/hr (01/16/17 0651)  . dilTIAZem HCl-Dextrose 5 mg/hr (01/16/17 1046)  . heparin 1,450 Units/hr (01/16/17 0943)   PRN Meds: sodium chloride, acetaminophen **OR** acetaminophen, bisacodyl, HYDROcodone-acetaminophen, ondansetron **OR** ondansetron (ZOFRAN) IV, polyethylene glycol, sodium chloride flush   Vital Signs    Vitals:   01/16/17 0600 01/16/17 0608 01/16/17 0700 01/16/17 1142  BP: (!) 121/93  (!) 144/106 (!) 132/111  Pulse: (!) 135 79 (!) 134 75  Resp: (!) 23 (!) 25 (!) 24 16  Temp:   97.7 F (36.5 C) 97.7 F (36.5 C)  TempSrc:   Oral Oral  SpO2: (!) 89% 91% 97% 96%  Weight:      Height:        Intake/Output Summary (Last 24 hours) at 01/16/17 1207 Last data filed at 01/16/17 1000  Gross per 24 hour  Intake          3529.86 ml  Output             1950 ml  Net          1579.86 ml   Filed Weights   01/13/17 2202 01/14/17 0333 01/16/17 0500  Weight: 230 lb (104.3 kg) 211 lb 13.8 oz (96.1 kg) 225 lb 12 oz (102.4 kg)    Telemetry    Atrial flutter, ventricular rate 70s - Personally Reviewed  ECG    No new tracings  Physical Exam   General: Well developed, well nourished, female appearing in mild acute distress. Head: Normocephalic, atraumatic.  Neck: Supple without bruits, JVD difficult to assess but is elevated, +HJR Lungs:  Resp regular and  unlabored, decreased BS bases w/ rales. Heart: RRR, S1, S2, no S3, S4, or murmur; no rub. Abdomen: Soft, non-tender, non-distended with normoactive bowel sounds. No hepatomegaly. No rebound/guarding. No obvious abdominal masses. Extremities: No clubbing, cyanosis, trace to 1+ edema in B LE.  Distal pedal pulses are 2+ bilaterally. Neuro: Alert and oriented X 3. Moves all extremities spontaneously. Psych: Normal affect.  Labs    Chemistry Recent Labs Lab 01/13/17 2010 01/14/17 1019 01/15/17 0440 01/15/17 0807 01/16/17 0230  NA 140 140 138  --  140  K 4.6 4.2 5.3* 3.5 3.7  CL 104 109 112*  --  108  CO2 22 20* 20*  --  23  GLUCOSE 235* 237* 180*  --  189*  BUN 29* 24* 22*  --  17  CREATININE 1.39* 1.22* 1.29*  --  1.11*  CALCIUM 9.2 7.9* 8.3*  --  8.6*  PROT 6.8  --   --   --   --   ALBUMIN 3.7  --   --   --   --   AST 63*  --   --   --   --   ALT 48  --   --   --   --  ALKPHOS 117  --   --   --   --   BILITOT 0.4  --   --   --   --   GFRNONAA 35* 41* 38*  --  46*  GFRAA 40* 47* 44*  --  53*  ANIONGAP 14 11 6   --  9     Hematology Recent Labs Lab 01/14/17 1019 01/15/17 0440 01/16/17 0230  WBC 10.1 10.6* 10.7*  RBC 3.78* 3.70* 3.72*  HGB 12.2 12.0 11.7*  HCT 38.7 38.7 37.8  MCV 102.4* 104.6* 101.6*  MCH 32.3 32.4 31.5  MCHC 31.5 31.0 31.0  RDW 15.2 15.3 15.4  PLT 176 158 184    Cardiac Enzymes Recent Labs Lab 01/13/17 2010 01/14/17 0143 01/14/17 0713 01/14/17 1019  TROPONINI <0.03 <0.03 <0.03 <0.03    Recent Labs Lab 01/13/17 2023  TROPIPOC 0.00     BNP Recent Labs Lab 01/13/17 2023  BNP 287.2*     Radiology    Dg Chest Port 1 View  Result Date: 01/16/2017 CLINICAL DATA:  Dyspnea, history of atrial flutter, diabetes, chronic renal insufficiency, nonsmoker EXAM: PORTABLE CHEST 1 VIEW COMPARISON:  Chest x-ray of January 13, 2017 FINDINGS: The lungs are adequately inflated. The interstitial markings have become more conspicuous bilaterally.  There is mild pulmonary vascular congestion. The cardiac silhouette is enlarged. There is tortuosity of the ascending and descending thoracic aorta. There is calcification in the wall of the aortic arch. There is no pleural effusion. The observed bony thorax is unremarkable. IMPRESSION: Mild pulmonary interstitial edema likely due to mild CHF. No alveolar pneumonia nor significant pleural effusion. Thoracic aortic atherosclerosis. Electronically Signed   By: David  Martinique M.D.   On: 01/16/2017 09:42    Cardiac Studies   Echocardiogram 01/14/17: Study Conclusions - Left ventricle: The cavity size was normal. Wall thickness was   increased in a pattern of mild LVH. Systolic function was normal.   The estimated ejection fraction was in the range of 50% to 55%.   Wall motion was normal; there were no regional wall motion   abnormalities. - Ventricular septum: The contour showed diastolic flattening and   systolic flattening. - Mitral valve: Calcified annulus. - Right ventricle: Systolic function was moderately reduced. - Atrial septum: The septum bowed from right to left, consistent   with increased right atrial pressure. - Tricuspid valve: There was moderate regurgitation. - Pulmonary arteries: Systolic pressure was moderately increased.   PA peak pressure: 51 mm Hg (S). Impressions: - Technically difficult; definity used; normal LV systolic   function; D shaped septum; moderately reduced RV function;   moderate TR; moderately elevated pulmonary pressure.  Patient Profile     83 y.o. female with past medical history of HTN, Type 2 DM, CKD, and dementia who presented to [facility] ED on 01/13/2017 for worsening dyspnea  Assessment & Plan    1.  atrial flutter-atrial arrhythmia  - likely related to the stress of her recent pulmonary embolus with increased pulmonary pressures and right atrial/right ventricular strain. Her blood pressure is borderline.  - Continue amiodarone gtt to help  control rateand potentially convert.  - Continue lopressor 25 mg BID  - diltiazem gtt at 5mg /hr - Continue heparin gtt.  - TSH is normal. Echocardiogram without LV dysfunction.  - CHADSvasc = 6 (female, age x 2, CHF, HTN, DM). She will probably require long-term anticoagulation as +arrhythmia, DVT & pulmonary embolus. There are no clear precipitating factors. - was called by nursing for a  transient increase in heart rate with immediate return to rate controlled Aflutter  2. pulmonary embolus - continue heparin gtt.  - Would ultimately change to apixaban.   3. Hypertension - blood pressure 132/111 - 144/106  - continue to hold lisinopril in the presence of IV diuresis with CKD  4. Acute on Chronic diastolic CHF: - PAS 51 at echo - SOB worse today, O2 sats only 96% on O2 - got Lasix 20 mg early this am - increased second dose of lasix to 40 mg IV; for a total of 60 mg IV lasix this am - will start on 40 mg IV BID - inserted foley catheter for diuresis  Signed, Rosaria Ferries , PA-C 12:07 PM 01/16/2017 Pager: [phone]  The patient was seen, examined and discussed with Rosaria Ferries, PA-C and I agree with the above.   The patient remains in atrial flutter, Cardizem drip added to the amiodarone drip the last night, ventricular rate 130--> 70-80'. She is significantly fluid overloaded, we will add lasix 40 mg iv Q12H. Crea 1.1, we will follow closely.   Ena Dawley, MD 01/16/2017

## 2017-01-16 NOTE — Progress Notes (Signed)
MD Grandville Silos paged to update that pt on 7L HF [place], requiring more O2 then yesterday, HR still increased.

## 2017-01-16 NOTE — Progress Notes (Signed)
Paged Catherine Gonzales of [place] regarding pt's increased confusion as night has progressed. Pt now very confused and adamant about getting out of bed and leaving hospital. Order received for Ativan and administered at [place]. Will continue to monitor and assess.  Update: Because of pt's confusion unrelieved by Ativan administration pt's daughter-[name]-called by RN @0023  to speak with pt. Pt became calmer and more relaxed upon speaking with daughter but still confused. Situation along with update of pt's condition provided to pt's daughter and daughter expressed appreciation for calling her. RN assured pt's daughter she would be called again if pt's condition changed. By end of conversation with pt's daughter pt appeared to be sleeping comfortably. O2 demand increased slightly from 5 to 6L [place] presumably because of pt's more shallow respirations while asleep. Will continue to monitor and assess.

## 2017-01-16 NOTE — Progress Notes (Signed)
PULMONARY  / CRITICAL CARE MEDICINE CONSULTATION   Name: DENISSA ROBE MRN: [id number] DOB: 06/24/1934    ADMISSION DATE:  01/13/2017 CONSULTATION DATE: January 16, 2017  REQUESTING CLINICIAN: January 16, 2017  PRIMARY SERVICE: Hospitalist  CHIEF COMPLAINT:  dyspnea  BRIEF PATIENT DESCRIPTION: 83 y/o woman with PE  SIGNIFICANT EVENTS / STUDIES:  CT-A chest showing bilateral PEs  LINES / TUBES: PIVs  CULTURES: None  ANTIBIOTICS: None   SUBJECTIVE:  Called back by [facility] for increase O2 needs. CXR with worse edema.  Pt received 20mg  lasix and now subjectively feels improved.  States "I'm on the right track". Can probably tolerate more lasix.  Now with SpO2 mid 90's.  VITAL SIGNS: Temp:  [97.7 F (36.5 C)-99.3 F (37.4 C)] 97.7 F (36.5 C) (01/30 1142) Pulse Rate:  [75-141] 75 (01/30 1142) Resp:  [15-33] 16 (01/30 1142) BP: (121-158)/(82-144) 132/111 (01/30 1142) SpO2:  [87 %-97 %] 97 % (01/30 0700) Weight:  [102.4 kg (225 lb 12 oz)] 102.4 kg (225 lb 12 oz) (01/30 0500) HEMODYNAMICS:   VENTILATOR SETTINGS:   INTAKE / OUTPUT: Intake/Output      01/29 0701 - 01/30 0700 01/30 0701 - 01/31 0700   P.O. 240    I.V. (mL/kg) 3289.9 (32.1)    Other     IV Piggyback     Total Intake(mL/kg) 3529.9 (34.5)    Urine (mL/kg/hr) 1525 (0.6) 750 (1.5)   Total Output 1525 750   Net +2004.9 -750        Urine Occurrence 2 x      PHYSICAL EXAMINATION: Constitutional: Adult female, sitting in recliner watching TV, no distress. Eyes: conjunctivae normal. ENMT: Mucous membranes are moist. Posterior pharynx clear of any exudate or lesions.   Neck: Normal, supple, no masses. Respiratory: no wheeze, crackles, or rhonchi appreciated. No accessory muscle use. Normal respiratory effort Cardiovascular: Tachy, regular.  Skin is dry. JVP not well-visualized. Abdomen: No distension, no tenderness, no masses palpated. Bowel sounds normal.  Musculoskeletal: no clubbing / cyanosis. No  joint deformity upper and lower extremities. Normal muscle tone.  Skin: no significant rashes, lesions, ulcers. Warm, dry, well-perfused.   LABS:  CBC  Recent Labs Lab 01/14/17 1019 01/15/17 0440 01/16/17 0230  WBC 10.1 10.6* 10.7*  HGB 12.2 12.0 11.7*  HCT 38.7 38.7 37.8  PLT 176 158 184   Coag's No results for input(s): APTT, INR in the last 168 hours. BMET  Recent Labs Lab 01/14/17 1019 01/15/17 0440 01/15/17 0807 01/16/17 0230  NA 140 138  --  140  K 4.2 5.3* 3.5 3.7  CL 109 112*  --  108  CO2 20* 20*  --  23  BUN 24* 22*  --  17  CREATININE 1.22* 1.29*  --  1.11*  GLUCOSE 237* 180*  --  189*   Electrolytes  Recent Labs Lab 01/14/17 1019 01/15/17 0440 01/16/17 0230  CALCIUM 7.9* 8.3* 8.6*  MG 1.4* 2.2  --    Sepsis Markers  Recent Labs Lab 01/14/17 0143 01/14/17 0713 01/15/17 0440  LATICACIDVEN 3.7* 2.3* 1.9   ABG  Recent Labs Lab 01/13/17 2036 01/16/17 0840  PHART 7.339* 7.457*  PCO2ART 43.2 30.7*  PO2ART 108.0 59.3*   Liver Enzymes  Recent Labs Lab 01/13/17 2010  AST 63*  ALT 48  ALKPHOS 117  BILITOT 0.4  ALBUMIN 3.7   Cardiac Enzymes  Recent Labs Lab 01/14/17 0143 01/14/17 0713 01/14/17 1019  TROPONINI <0.03 <0.03 <0.03   Glucose  Recent  Labs Lab 01/14/17 2058 01/15/17 0824 01/15/17 1231 01/15/17 1724 01/15/17 2108 01/16/17 0749  GLUCAP 131* 165* 178* 148* 161* 207*    Imaging Dg Chest Port 1 View  Result Date: 01/16/2017 CLINICAL DATA:  Dyspnea, history of atrial flutter, diabetes, chronic renal insufficiency, nonsmoker EXAM: PORTABLE CHEST 1 VIEW COMPARISON:  Chest x-ray of January 13, 2017 FINDINGS: The lungs are adequately inflated. The interstitial markings have become more conspicuous bilaterally. There is mild pulmonary vascular congestion. The cardiac silhouette is enlarged. There is tortuosity of the ascending and descending thoracic aorta. There is calcification in the wall of the aortic arch. There  is no pleural effusion. The observed bony thorax is unremarkable. IMPRESSION: Mild pulmonary interstitial edema likely due to mild CHF. No alveolar pneumonia nor significant pleural effusion. Thoracic aortic atherosclerosis. Electronically Signed   By: David  Martinique M.D.   On: 01/16/2017 09:42    EKG: Sinus tachycardia, possible flutter. CXR: Chest CT shows bilateral pulmonary emboli and normal lung parenchyma.  Echo 1/28 > EF 50-55%, PAP 51.  ASSESSMENT / PLAN:  Submassive Pulmonary Emboli, subacute. DVT b/l peroneal veins. Acute hypoxic respiratory failure - multifactorial; due to above + CHF.  Possible component OSA / OHS as well. Plan: Continue heparin gtt. Transition to oral agent. Continue supplemental O2 as needed to maintain SpO2 > 92%. Additional 20mg  lasix now (had improvement after 1st dose). May need additional /aggressive diuresis - per TRH. Incentive spirometry. PT consult - ambulate / mobilize as able. O2 ambulatory desat study to assess for home needs.  A.flutter with RVR. Plan: Continue heparin. Rate control per cards.   TODAY'S SUMMARY: 83 y/o woman with submassive subacute PE.  Tolerating heparin well, needs transition to oral agent.  Had increased in O2 requirements 01/30, likely multifactorial in setting V/Q mismatch from PE, CHF / edema with resultant shunt, alveolar hypoventilation, etc. Received 20mg  lasix and had improvement, CXR c/w edema; therefore, will add additional 20mg  lasix now.  She may need additional doses of lasix at least temporarily.   Nothing further to add.  PCCM will sign off.  Please do not hesitate to call us back if we can be of any further assistance.  Montey Hora, [facility] Pager: [phone]  or [phone] 01/16/2017, 11:47 AM   ATTENDING NOTE / ATTESTATION NOTE :   I have discussed the case with the resident/APP  Montey Hora PA.   I agree with the resident/APP's  history,  physical examination, assessment, and plans.    I have edited the above note and modified it according to our agreed history, physical examination, assessment and plan.   Briefly, pt admitted with SOB which was slow to improve.  She has baseline mild dementia. Functional at home, sedentary last 4 weeks 2/2 SOB. Chest CTA was (+) for B filling defects.  Currently on heparin drip. Last 24 hrs, was noted to be more tachypneic. She was 4L positive.  Got lasix this am and her SOB is better.  She was also switched to HF [place], was on 6L this am, currently on 4 L [place]. HR better controlled on amio and dilt drips.   Vitals:  Vitals:   01/16/17 0600 01/16/17 0608 01/16/17 0700 01/16/17 1142  BP: (!) 121/93  (!) 144/106 (!) 132/111  Pulse: (!) 135 79 (!) 134 75  Resp: (!) 23 (!) 25 (!) 24 16  Temp:   97.7 F (36.5 C) 97.7 F (  36.5 C)  TempSrc:   Oral Oral  SpO2: (!) 89% 91% 97% 96%  Weight:      Height:        Constitutional/General: well-nourished, well-developed, intubated, sedated, in mild resp distress  Body mass index is 39.36 kg/m. Wt Readings from Last 3 Encounters:  01/16/17 102.4 kg (225 lb 12 oz)    HEENT: PERLA, anicteric sclerae. (-) Oral thrush.   Neck: No masses. Midline trachea. No JVD, (-) LAD. (-) bruits appreciated.  Respiratory/Chest: Grossly normal chest. (-) deformity. Occasional  Accessory muscle use.  Symmetric expansion. Diminished BS on both lower lung zones. (-) wheezing, rhonchi. Crackles mid to bases.  (-) egophony  Cardiovascular: Regular rate and  rhythm, tachycardic. heart sounds normal, no murmur or gallops,  Gr 1 peripheral edema  Gastrointestinal:  Normal bowel sounds. Soft, non-tender. No hepatosplenomegaly.  (-) masses.   Musculoskeletal:  Normal muscle tone.   Extremities: Grossly normal. (-) clubbing, cyanosis.  Gr 1  edema  Skin: (-) rash,lesions seen.   Neurological/Psychiatric :CN grossly intact. (-) lateralizing signs.   CBC Recent Labs      01/14/17  1019  01/15/17  0440  01/16/17  0230  WBC  10.1  10.6*  10.7*  HGB  12.2  12.0  11.7*  HCT  38.7  38.7  37.8  PLT  176  158  184    Coag's No results for input(s): APTT, INR in the last 72 hours.  BMET Recent Labs     01/14/17  1019  01/15/17  0440  01/15/17  0807  01/16/17  0230  NA  140  138   --   140  K  4.2  5.3*  3.5  3.7  CL  109  112*   --   108  CO2  20*  20*   --   23  BUN  24*  22*   --   17  CREATININE  1.22*  1.29*   --   1.11*  GLUCOSE  237*  180*   --   189*    Electrolytes Recent Labs     01/14/17  1019  01/15/17  0440  01/16/17  0230  CALCIUM  7.9*  8.3*  8.6*  MG  1.4*  2.2   --     Sepsis Markers No results for input(s): PROCALCITON, O2SATVEN in the last 72 hours.  Invalid input(s): LACTICACIDVEN  ABG Recent Labs     01/13/17  2036  01/16/17  0840  PHART  7.339*  7.457*  PCO2ART  43.2  30.7*  PO2ART  108.0  59.3*    Liver Enzymes Recent Labs     01/13/17  2010  AST  63*  ALT  48  ALKPHOS  117  BILITOT  0.4  ALBUMIN  3.7    Cardiac Enzymes Recent Labs     01/14/17  0143  01/14/17  0713  01/14/17  1019  TROPONINI  <0.03  <0.03  <0.03    Glucose Recent Labs     01/15/17  1231  01/15/17  1724  01/15/17  2108  01/16/17  0749  01/16/17  1211  01/16/17  1511  GLUCAP  178*  148*  161*  207*  277*  260*    Imaging Dg Chest Port 1 View  Result Date: 01/16/2017 CLINICAL DATA:  Dyspnea, history of atrial flutter, diabetes, chronic renal insufficiency, nonsmoker EXAM: PORTABLE CHEST 1 VIEW COMPARISON:  Chest x-ray of January 13, 2017 FINDINGS: The  lungs are adequately inflated. The interstitial markings have become more conspicuous bilaterally. There is mild pulmonary vascular congestion. The cardiac silhouette is enlarged. There is tortuosity of the ascending and descending thoracic aorta. There is calcification in the wall of the aortic arch. There is no pleural effusion. The observed bony thorax is  unremarkable. IMPRESSION: Mild pulmonary interstitial edema likely due to mild CHF. No alveolar pneumonia nor significant pleural effusion. Thoracic aortic atherosclerosis. Electronically Signed   By: David  Martinique M.D.   On: 01/16/2017 09:42   Assessment/Plan : Acute / Subacute Pulmonary embolism.  Haemodynamically stable.  Continue heparin drip.  Transition to PO OAC. Assess risk vs benefit.  Pt has mild baseline dementia and I am not sure if she is a fall risk. Denies fall.  Likely will be on chronic OAC 2/2 atrial flutter as well.  No indication for lytics at this point. Titrate o2 to keep O2 sats > 88%.   Acute hypoxemic resp failure 2/2 volume overload + undiagnosed OSA/OHS.  Agree with lasix.  Cont with diuresis as creatinine allows.  She is 3L positive.  Pts HR/atrial flutter is better controlled now.  Keep o2 sats > 88%.  May try bipap 12/5 if SOB worsens.   Pt has a crowded airway and has snoring, fatigue.  May have underlying OSA. Needs PSG as an outpt  I spent  30   minutes of Critical Care time with this patient today. This is my time spent independent of the APP or resident.   Family :  No family at bedside. Pt updated of plans.    Monica Becton, MD 01/16/2017, 3:37 PM [facility] Pager [phone] After 3 pm or if no answer, call [phone]

## 2017-01-16 NOTE — Progress Notes (Signed)
ANTICOAGULATION CONSULT NOTE  Pharmacy Consult for Heparin Indication: pulmonary embolus  No Known Allergies  Patient Measurements: Height: 5' 3.5" (161.3 cm) Weight: 225 lb 12 oz (102.4 kg) IBW/kg (Calculated) : 53.55 Heparin Dosing Weight: 78 kg  Vital Signs: Temp: 97.6 F (36.4 C) (01/30 1644) Temp Source: Axillary (01/30 1644) BP: 125/68 (01/30 1644) Pulse Rate: 79 (01/30 1644)  Labs:  Recent Labs  01/14/17 0143 01/14/17 0713 01/14/17 1019  01/15/17 0440 01/16/17 0230 01/16/17 1801  HGB  --   --  12.2  --  12.0 11.7*  --   HCT  --   --  38.7  --  38.7 37.8  --   PLT  --   --  176  --  158 184  --   HEPARINUNFRC  --   --  0.36  < > 0.33 0.34 0.23*  CREATININE  --   --  1.22*  --  1.29* 1.11*  --   TROPONINI <0.03 <0.03 <0.03  --   --   --   --   < > = values in this interval not displayed.  Estimated Creatinine Clearance: 46.6 mL/min (by C-G formula based on SCr of 1.11 mg/dL (H)).   Assessment: 83 y.o. F presents with SOB. On heparin for bilateral submassive PE with R heart strain (RV/LV 1.37), also bilateral DVT noted in peroneal veins on [place].   Heparin level this evening is subtherapeutic at 0.23 despite rate increase. Level has trended down over last few days. No issues with infusion per RN.   Goal of Therapy:  Heparin level 0.3-0.7 units/ml Monitor platelets by anticoagulation protocol: Yes   Plan:  1. Heparin 1000 unit bolus  2. Increase Heparin infusion to 1650 units/hr (16.5 ml/hr) 3. Will continue to monitor for any signs/symptoms of bleeding and will follow up with heparin level in 8 hours   Thank you for allowing pharmacy to be a part of this patient's care.  Vincenza Hews, PharmD, BCPS 01/16/2017, 7:27 PM

## 2017-01-16 NOTE — Progress Notes (Signed)
Paged Dr. Aundra Dubin again regarding pt's A-flutter unrelieved by current gtt's and PO regimen. Order received to increase Cardizem to 20mg /hr and to not re-page unless pt's status changed from current presentation. Will administer and continue to monitor.

## 2017-01-16 NOTE — Progress Notes (Signed)
ANTICOAGULATION CONSULT NOTE  Pharmacy Consult for Heparin Indication: pulmonary embolus  No Known Allergies  Patient Measurements: Height: 5' 3.5" (161.3 cm) Weight: 225 lb 12 oz (102.4 kg) IBW/kg (Calculated) : 53.55 Heparin Dosing Weight: 78 kg  Vital Signs: Temp: 98.1 F (36.7 C) (01/30 0300) Temp Source: Oral (01/30 0300) BP: 121/93 (01/30 0600) Pulse Rate: 79 (01/30 0608)  Labs:  Recent Labs  01/14/17 0143 01/14/17 0713  01/14/17 1019 01/14/17 2223 01/15/17 0440 01/16/17 0230  HGB  --   --   --  12.2  --  12.0 11.7*  HCT  --   --   --  38.7  --  38.7 37.8  PLT  --   --   --  176  --  158 184  HEPARINUNFRC  --   --   < > 0.36 0.41 0.33 0.34  CREATININE  --   --   --  1.22*  --  1.29* 1.11*  TROPONINI <0.03 <0.03  --  <0.03  --   --   --   < > = values in this interval not displayed.  Estimated Creatinine Clearance: 46.6 mL/min (by C-G formula based on SCr of 1.11 mg/dL (H)).   Assessment: 83 y.o. F presents with SOB. On heparin for bilateral submassive PE with R heart strain (RV/LV 1.37), also bilateral DVT noted in peroneal veins on [place].   Heparin level this morning is therapeutic (HL 0.34 << 0.33, goal of 0.3-0.7). Hgb 11.7, plts wnl. No overt s/sx of bleeding at this time. It is noted that the patient has had increased WOB and tachycardia/Afib. Will increase the heparin drip slightly to target the higher end of the range with the acute PE.   Goal of Therapy:  Heparin level 0.3-0.7 units/ml Monitor platelets by anticoagulation protocol: Yes   Plan:  1. Increase Heparin to 1450 units/hr (14.5 ml/hr) 2. Will continue to monitor for any signs/symptoms of bleeding and will follow up with heparin level in 8 hours   Thank you for allowing pharmacy to be a part of this patient's care.  Alycia Rossetti, PharmD, BCPS Clinical Pharmacist Pager: [phone] Clinical phone for 01/16/2017 from 7a-3:30p: [phone] If after 3:30p, please call main pharmacy at:  [phone] 01/16/2017 9:25 AM

## 2017-01-16 NOTE — Progress Notes (Signed)
MD Grandville Silos updated to patients tachypnea, Hr, drips, and increased oxygen demand. MD wants STAT portable CXR. RN placed order. Will continue to monitor.

## 2017-01-17 LAB — GLUCOSE, CAPILLARY
GLUCOSE-CAPILLARY: 163 mg/dL — AB (ref 65–99)
GLUCOSE-CAPILLARY: 236 mg/dL — AB (ref 65–99)
GLUCOSE-CAPILLARY: 133 mg/dL — AB (ref 65–99)
GLUCOSE-CAPILLARY: 180 mg/dL — AB (ref 65–99)

## 2017-01-17 LAB — COMPREHENSIVE METABOLIC PANEL
ALBUMIN: 2.8 g/dL — AB (ref 3.5–5.0)
ALT: 18 U/L (ref 14–54)
ANION GAP: 15 (ref 5–15)
AST: 17 U/L (ref 15–41)
Alkaline Phosphatase: 94 U/L (ref 38–126)
BUN: 18 mg/dL (ref 6–20)
CO2: 22 mmol/L (ref 22–32)
Calcium: 8.4 mg/dL — ABNORMAL LOW (ref 8.9–10.3)
Chloride: 101 mmol/L (ref 101–111)
Creatinine, Ser: 1.16 mg/dL — ABNORMAL HIGH (ref 0.44–1.00)
GFR calc non Af Amer: 43 mL/min — ABNORMAL LOW (ref >60)
GFR, EST AFRICAN AMERICAN: 50 mL/min — AB (ref >60)
Glucose, Bld: 134 mg/dL — ABNORMAL HIGH (ref 65–99)
Potassium: 3.6 mmol/L (ref 3.5–5.1)
SODIUM: 138 mmol/L (ref 135–145)
Total Bilirubin: 0.6 mg/dL (ref 0.3–1.2)
Total Protein: 5.6 g/dL — ABNORMAL LOW (ref 6.5–8.1)

## 2017-01-17 LAB — CBC
HCT: 35.3 % — ABNORMAL LOW (ref 36.0–46.0)
Hemoglobin: 11.3 g/dL — ABNORMAL LOW (ref 12.0–15.0)
MCH: 32.3 pg (ref 26.0–34.0)
MCHC: 32 g/dL (ref 30.0–36.0)
MCV: 100.9 fL — ABNORMAL HIGH (ref 78.0–100.0)
PLATELETS: 205 10*3/uL (ref 150–400)
RBC: 3.5 MIL/uL — AB (ref 3.87–5.11)
RDW: 15.5 % (ref 11.5–15.5)
WBC: 10.7 10*3/uL — ABNORMAL HIGH (ref 4.0–10.5)

## 2017-01-17 LAB — HEPARIN LEVEL (UNFRACTIONATED)
HEPARIN UNFRACTIONATED: 0.42 [IU]/mL (ref 0.30–0.70)
Heparin Unfractionated: 0.45 IU/mL (ref 0.30–0.70)

## 2017-01-17 LAB — MAGNESIUM: Magnesium: 1.2 mg/dL — ABNORMAL LOW (ref 1.7–2.4)

## 2017-01-17 MED ORDER — ATORVASTATIN CALCIUM 20 MG PO TABS
20.0000 mg | ORAL_TABLET | Freq: Every day | ORAL | Status: DC
  Administered 2017-01-17 – 2017-01-19 (×3): 20 mg via ORAL
  Filled 2017-01-17 (×3): qty 1

## 2017-01-17 MED ORDER — SODIUM CHLORIDE 0.9 % IV SOLN
INTRAVENOUS | Status: DC
  Administered 2017-01-17: 16:00:00 via INTRAVENOUS

## 2017-01-17 MED ORDER — EZETIMIBE 10 MG PO TABS
10.0000 mg | ORAL_TABLET | Freq: Every day | ORAL | Status: DC
  Administered 2017-01-17 – 2017-01-19 (×3): 10 mg via ORAL
  Filled 2017-01-17 (×3): qty 1

## 2017-01-17 MED ORDER — MAGNESIUM SULFATE 2 GM/50ML IV SOLN
2.0000 g | Freq: Once | INTRAVENOUS | Status: AC
Start: 2017-01-17 — End: 2017-01-17
  Administered 2017-01-17: 2 g via INTRAVENOUS
  Filled 2017-01-17: qty 50

## 2017-01-17 MED ORDER — AMIODARONE HCL 200 MG PO TABS
200.0000 mg | ORAL_TABLET | Freq: Two times a day (BID) | ORAL | Status: DC
  Administered 2017-01-17 – 2017-01-20 (×8): 200 mg via ORAL
  Filled 2017-01-17 (×8): qty 1

## 2017-01-17 NOTE — Progress Notes (Signed)
I notified Cardiology on call Dr. Raiford Simmonds and clarified that patient was on amio and cardizem gtt and patient had converted to NSR HR 70's. Verbal order received to stop drips without weaning and start amiodarone P.O. 200 mg bid. I  Will continue to monitor.

## 2017-01-17 NOTE — Progress Notes (Signed)
Progress Note  Patient Name: Catherine Gonzales Date of Encounter: 01/17/2017  Primary Cardiologist: Dr. Stanford Breed  Subjective   The patient is feeling much better today, improved SOB. Laying flat.  Inpatient Medications    Scheduled Meds: . amiodarone  200 mg Oral BID  . aspirin  81 mg Oral Daily  . ezetimibe-simvastatin  1 tablet Oral Daily  . furosemide  40 mg Intravenous BID  . insulin aspart  0-15 Units Subcutaneous TID WC  . insulin aspart  0-5 Units Subcutaneous QHS  . metoprolol tartrate  25 mg Oral BID  . nortriptyline  25 mg Oral QHS  . sodium chloride flush  3 mL Intravenous Q12H   Continuous Infusions: . heparin 1,650 Units/hr (01/17/17 0732)   PRN Meds: sodium chloride, acetaminophen **OR** acetaminophen, bisacodyl, HYDROcodone-acetaminophen, ondansetron **OR** ondansetron (ZOFRAN) IV, polyethylene glycol, sodium chloride flush   Vital Signs    Vitals:   01/16/17 1931 01/16/17 2322 01/17/17 0304 01/17/17 0700  BP: (!) 142/94 (!) 152/87 114/64   Pulse: (!) 114 81 67   Resp: (!) 21 (!) 23 (!) 21   Temp: 98 F (36.7 C) 98 F (36.7 C) 98 F (36.7 C) 97.6 F (36.4 C)  TempSrc:  Oral Oral Oral  SpO2: 95% 96% 93%   Weight:      Height:        Intake/Output Summary (Last 24 hours) at 01/17/17 0816 Last data filed at 01/17/17 0306  Gross per 24 hour  Intake                0 ml  Output             2450 ml  Net            -2450 ml   Filed Weights   01/13/17 2202 01/14/17 0333 01/16/17 0500  Weight: 230 lb (104.3 kg) 211 lb 13.8 oz (96.1 kg) 225 lb 12 oz (102.4 kg)    Telemetry    Atrial flutter, ventricular rate 70s --> SR  ECG    No new tracings  Physical Exam   General: Well developed, well nourished, female appearing in mild acute distress. Head: Normocephalic, atraumatic.  Neck: Supple without bruits, JVD difficult to assess but is elevated, +HJR Lungs:  Resp regular and unlabored, decreased BS bases w/ rales. Heart: RRR, S1, S2, no S3,  S4, or murmur; no rub. Abdomen: Soft, non-tender, non-distended with normoactive bowel sounds. No hepatomegaly. No rebound/guarding. No obvious abdominal masses. Extremities: No clubbing, cyanosis, trace to 1+ edema in B LE.  Distal pedal pulses are 2+ bilaterally. Neuro: Alert and oriented X 3. Moves all extremities spontaneously. Psych: Normal affect.  Labs    Chemistry Recent Labs Lab 01/13/17 2010  01/15/17 0440 01/15/17 0807 01/16/17 0230 01/17/17 0416  NA 140  < > 138  --  140 138  K 4.6  < > 5.3* 3.5 3.7 3.6  CL 104  < > 112*  --  108 101  CO2 22  < > 20*  --  23 22  GLUCOSE 235*  < > 180*  --  189* 134*  BUN 29*  < > 22*  --  17 18  CREATININE 1.39*  < > 1.29*  --  1.11* 1.16*  CALCIUM 9.2  < > 8.3*  --  8.6* 8.4*  PROT 6.8  --   --   --   --  5.6*  ALBUMIN 3.7  --   --   --   --  2.8*  AST 63*  --   --   --   --  17  ALT 48  --   --   --   --  18  ALKPHOS 117  --   --   --   --  94  BILITOT 0.4  --   --   --   --  0.6  GFRNONAA 35*  < > 38*  --  46* 43*  GFRAA 40*  < > 44*  --  53* 50*  ANIONGAP 14  < > 6  --  9 15  < > = values in this interval not displayed.   Hematology  Recent Labs Lab 01/15/17 0440 01/16/17 0230 01/17/17 0416  WBC 10.6* 10.7* 10.7*  RBC 3.70* 3.72* 3.50*  HGB 12.0 11.7* 11.3*  HCT 38.7 37.8 35.3*  MCV 104.6* 101.6* 100.9*  MCH 32.4 31.5 32.3  MCHC 31.0 31.0 32.0  RDW 15.3 15.4 15.5  PLT 158 184 205    Cardiac Enzymes  Recent Labs Lab 01/13/17 2010 01/14/17 0143 01/14/17 0713 01/14/17 1019  TROPONINI <0.03 <0.03 <0.03 <0.03     Recent Labs Lab 01/13/17 2023  TROPIPOC 0.00     BNP  Recent Labs Lab 01/13/17 2023  BNP 287.2*     Radiology    Dg Chest Port 1 View  Result Date: 01/16/2017 CLINICAL DATA:  Dyspnea, history of atrial flutter, diabetes, chronic renal insufficiency, nonsmoker EXAM: PORTABLE CHEST 1 VIEW COMPARISON:  Chest x-ray of January 13, 2017 FINDINGS: The lungs are adequately inflated. The  interstitial markings have become more conspicuous bilaterally. There is mild pulmonary vascular congestion. The cardiac silhouette is enlarged. There is tortuosity of the ascending and descending thoracic aorta. There is calcification in the wall of the aortic arch. There is no pleural effusion. The observed bony thorax is unremarkable. IMPRESSION: Mild pulmonary interstitial edema likely due to mild CHF. No alveolar pneumonia nor significant pleural effusion. Thoracic aortic atherosclerosis. Electronically Signed   By: David  Martinique M.D.   On: 01/16/2017 09:42    Cardiac Studies   Echocardiogram 01/14/17: Study Conclusions - Left ventricle: The cavity size was normal. Wall thickness was   increased in a pattern of mild LVH. Systolic function was normal.   The estimated ejection fraction was in the range of 50% to 55%.   Wall motion was normal; there were no regional wall motion   abnormalities. - Ventricular septum: The contour showed diastolic flattening and   systolic flattening. - Mitral valve: Calcified annulus. - Right ventricle: Systolic function was moderately reduced. - Atrial septum: The septum bowed from right to left, consistent   with increased right atrial pressure. - Tricuspid valve: There was moderate regurgitation. - Pulmonary arteries: Systolic pressure was moderately increased.   PA peak pressure: 51 mm Hg (S). Impressions: - Technically difficult; definity used; normal LV systolic   function; D shaped septum; moderately reduced RV function;   moderate TR; moderately elevated pulmonary pressure.  Patient Profile     83 y.o. female with past medical history of HTN, Type 2 DM, CKD, and dementia who presented to [facility] ED on 01/13/2017 for worsening dyspnea  Assessment & Plan    1.  atrial flutter-atrial arrhythmia  - likely related to the stress of her recent pulmonary embolus with increased pulmonary pressures and right atrial/right ventricular strain. Her  blood pressure is borderline.  - she cardioverted spontaneously into the SR - switch iv amiodarone to PO amiodarone 200 mg  po BID, short term until acute illness resolves - Continue lopressor 25 mg BID  - Continue heparin gtt.  - TSH is normal. Echocardiogram without LV dysfunction.  - CHADSvasc = 6 (female, age x 2, CHF, HTN, DM). She will probably require long-term anticoagulation as +arrhythmia, DVT & pulmonary embolus. There are no clear precipitating factors. - was called by nursing for a transient increase in heart rate with immediate return to rate controlled Aflutter  2. pulmonary embolus - continue heparin gtt.  - Would ultimately change to apixaban.   3. Hypertension - blood pressure 132/111 - 144/106  - continue to hold lisinopril in the presence of IV diuresis with CKD  4. Acute on Chronic diastolic CHF: - great diuresis overnight - 2.4 L, Crea is stable - continue iv lasix 40 mg BID for now, she is still fluid overloaded  The plan is to mobilize, get into chair today.  Ena Dawley, MD 01/17/2017

## 2017-01-17 NOTE — Progress Notes (Signed)
ANTICOAGULATION CONSULT NOTE  Pharmacy Consult for Heparin Indication: pulmonary embolus  No Known Allergies  Patient Measurements: Height: 5' 3.5" (161.3 cm) Weight: 225 lb 12 oz (102.4 kg) IBW/kg (Calculated) : 53.55 Heparin Dosing Weight: 78 kg  Vital Signs: Temp: 97.8 F (36.6 C) (01/31 1500) Temp Source: Oral (01/31 1500) BP: 107/66 (01/31 1500) Pulse Rate: 79 (01/31 1500)  Labs:  Recent Labs  01/15/17 0440 01/16/17 0230 01/16/17 1801 01/17/17 0416 01/17/17 1823  HGB 12.0 11.7*  --  11.3*  --   HCT 38.7 37.8  --  35.3*  --   PLT 158 184  --  205  --   HEPARINUNFRC 0.33 0.34 0.23* 0.45 0.42  CREATININE 1.29* 1.11*  --  1.16*  --     Estimated Creatinine Clearance: 44.6 mL/min (by C-G formula based on SCr of 1.16 mg/dL (H)).   Assessment: 83 y.o. F presents with SOB. On heparin for bilateral submassive PE with R heart strain (RV/LV 1.37), also bilateral DVT noted in peroneal veins on [place].   Heparin level this morning is therapeutic after an additional rate increase yesterday evening  (HL 0.45 << 0.23, goal of 0.3-0.7). Hgb 11.3, plts wnl. No overt s/sx of bleeding at this time.   Continues to be within goal range on 1700 units/hr heparin  Goal of Therapy:  Heparin level 0.3-0.7 units/ml Monitor platelets by anticoagulation protocol: Yes   Plan:  Continue heparin at 1700 units/hr Daily Hep Lvl, CBC Monitor for s/sx of bleeding  Levester Fresh, PharmD, BCPS, BCCCP Clinical Pharmacist 01/17/2017 7:00 PM

## 2017-01-17 NOTE — Care Management Important Message (Signed)
Important Message  Patient Details  Name: Catherine Gonzales MRN: [id number] Date of Birth: Dec 13, 1934   Medicare Important Message Given:  Yes    Nathen May 01/17/2017, 12:05 PM

## 2017-01-17 NOTE — Evaluation (Signed)
Physical Therapy Evaluation Patient Details Name: Catherine Gonzales MRN: [id number] DOB: 08-13-1934 Today's Date: 01/17/2017   History of Present Illness  Pt is an 83 y/o female admitted from home secondary to acute SOB and near syncope, found to have an acute PE with heart strain. PMH including but not limited to mild dementia, DM, CKD and HTN.  Clinical Impression  Pt presented sitting OOB in recliner when PT entered room. Prior to admission, pt reported that she is independent with ambulation and ADLs. Pt currently requires min guard for safety with transfers and ambulation without an AD. All VSS throughout. Pt would continue to benefit from skilled physical therapy services at this time while admitted to address her below listed limitations in order to improve her overall safety and independence with functional mobility.       Follow Up Recommendations No PT follow up;Supervision - Intermittent    Equipment Recommendations  None recommended by PT    Recommendations for Other Services       Precautions / Restrictions Precautions Precautions: Fall Restrictions Weight Bearing Restrictions: No      Mobility  Bed Mobility               General bed mobility comments: pt sitting OOB in recliner when PT entered room  Transfers Overall transfer level: Needs assistance Equipment used: None Transfers: Sit to/from Stand Sit to Stand: Min guard         General transfer comment: pt required increased time, min guard for safety  Ambulation/Gait Ambulation/Gait assistance: Min guard Ambulation Distance (Feet): 30 Feet Assistive device: None Gait Pattern/deviations: Step-through pattern;Decreased stride length;Decreased step length - right;Decreased step length - left Gait velocity: decreased Gait velocity interpretation: Below normal speed for age/gender General Gait Details: pt cautious with movement; however, more related to concern with her catheter.  Stairs             Wheelchair Mobility    Modified Rankin (Stroke Patients Only)       Balance Overall balance assessment: Needs assistance Sitting-balance support: Feet supported;No upper extremity supported Sitting balance-Leahy Scale: Fair     Standing balance support: During functional activity;No upper extremity supported Standing balance-Leahy Scale: Fair                               Pertinent Vitals/Pain Pain Assessment: No/denies pain    Home Living Family/patient expects to be discharged to:: Private residence Living Arrangements: Alone Available Help at Discharge: Family;Available 24 hours/day (pt's daughter is planning to stay with her upon d/c) Type of Home: House Home Access: Stairs to enter Entrance Stairs-Rails: None Entrance Stairs-Number of Steps: 2 Home Layout: One level Home Equipment: None      Prior Function Level of Independence: Independent               Hand Dominance        Extremity/Trunk Assessment   Upper Extremity Assessment Upper Extremity Assessment: Overall WFL for tasks assessed    Lower Extremity Assessment Lower Extremity Assessment: Overall WFL for tasks assessed       Communication   Communication: No difficulties  Cognition Arousal/Alertness: Awake/alert Behavior During Therapy: WFL for tasks assessed/performed Overall Cognitive Status: History of cognitive impairments - at baseline                 General Comments: pt with mild dementia at baseline; however, WFL for simple questions asked during evaluation.  General Comments      Exercises     Assessment/Plan    PT Assessment Patient needs continued PT services  PT Problem List Decreased activity tolerance;Decreased balance;Decreased mobility;Decreased coordination;Decreased knowledge of use of DME;Decreased safety awareness          PT Treatment Interventions DME instruction;Gait training;Stair training;Functional mobility  training;Therapeutic activities;Therapeutic exercise;Balance training;Neuromuscular re-education;Patient/family education    PT Goals (Current goals can be found in the Care Plan section)  Acute Rehab PT Goals Patient Stated Goal: return home PT Goal Formulation: With patient Time For Goal Achievement: 01/31/17 Potential to Achieve Goals: Good    Frequency Min 3X/week   Barriers to discharge        Co-evaluation               End of Session Equipment Utilized During Treatment: Gait belt Activity Tolerance: Patient tolerated treatment well Patient left: in chair;with call bell/phone within reach;with chair alarm set Nurse Communication: Mobility status         Time: 1500-1520 PT Time Calculation (min) (ACUTE ONLY): 20 min   Charges:   PT Evaluation $PT Eval Moderate Complexity: 1 Procedure     PT G CodesClearnce Sorrel Lateya Dauria 01/17/2017, 4:51 PM Sherie Don, [place], DPT [phone]

## 2017-01-17 NOTE — Progress Notes (Signed)
Pt daughter prefers once a day dose of anticoagulation. Likely discharge on Xarelto.

## 2017-01-17 NOTE — Progress Notes (Signed)
ANTICOAGULATION CONSULT NOTE  Pharmacy Consult for Heparin Indication: pulmonary embolus  No Known Allergies  Patient Measurements: Height: 5' 3.5" (161.3 cm) Weight: 225 lb 12 oz (102.4 kg) IBW/kg (Calculated) : 53.55 Heparin Dosing Weight: 78 kg  Vital Signs: Temp: 97.6 F (36.4 C) (01/31 0700) Temp Source: Oral (01/31 0700) BP: 143/73 (01/31 0700) Pulse Rate: 80 (01/31 0730)  Labs:  Recent Labs  01/14/17 1019  01/15/17 0440 01/16/17 0230 01/16/17 1801 01/17/17 0416  HGB 12.2  --  12.0 11.7*  --  11.3*  HCT 38.7  --  38.7 37.8  --  35.3*  PLT 176  --  158 184  --  205  HEPARINUNFRC 0.36  < > 0.33 0.34 0.23* 0.45  CREATININE 1.22*  --  1.29* 1.11*  --  1.16*  TROPONINI <0.03  --   --   --   --   --   < > = values in this interval not displayed.  Estimated Creatinine Clearance: 44.6 mL/min (by C-G formula based on SCr of 1.16 mg/dL (H)).   Assessment: 83 y.o. F presents with SOB. On heparin for bilateral submassive PE with R heart strain (RV/LV 1.37), also bilateral DVT noted in peroneal veins on [place].   Heparin level this morning is therapeutic after an additional rate increase yesterday evening  (HL 0.45 << 0.23, goal of 0.3-0.7). Hgb 11.3, plts wnl. No overt s/sx of bleeding at this time. Will increase the drip rate slightly this morning to target the higher end of the range.    Goal of Therapy:  Heparin level 0.3-0.7 units/ml Monitor platelets by anticoagulation protocol: Yes   Plan:  1. Increase Heparin to 1700 units/hr (17 ml/hr) 2. Will continue to monitor for any signs/symptoms of bleeding and will follow up with heparin level in 8 hours   Thank you for allowing pharmacy to be a part of this patient's care.  Alycia Rossetti, PharmD, BCPS Clinical Pharmacist Pager: [phone] Clinical phone for 01/17/2017 from 7a-3:30p: [phone] If after 3:30p, please call main pharmacy at: [phone] 01/17/2017 9:16 AM

## 2017-01-17 NOTE — Progress Notes (Signed)
S/W ROBIN  @ [facility] # [phone]   XARELTO  20 MG DAILY   COVER- YES  CO-PAY- $ 28.00  TIER- 2 DRUG  PRIOR APPROVAL- NO  PHARMACY : [facility] AND [facility]

## 2017-01-17 NOTE — Progress Notes (Addendum)
[place] TEAM 1 - Stepdown/ICU TEAM  Catherine Gonzales  [id number] DOB: 1934/06/25 DOA: 01/13/2017 PCP: No primary care provider on file.    Brief Narrative:  83 year old female w/ a history of hypertension, type 2 diabetes, dementia, chronic kidney disease with unknown baseline who presented to the ED with worsening shortness of breath, diaphoresis and a near syncopal episode. Her ED w/u revealed bilateral submassive pulmonary emboli with right ventricular strain and also atrial flutter with soft blood pressures.  Subjective: The patient is sitting up in a chair visiting with her best friend.  She denies current chest pain shortness breath fevers chills nausea or vomiting.  She is conversant but must Pauls at times during the conversation to catch her breath.  Assessment & Plan:  subacute submassive bilateral multilobar pulmonary emboli w/ right ventricular strain / acute hypoxic respiratory failure / bilateral peroneal DVT denies recent surgeries or long car rides or family history of DVT - no current history of neoplasm - plan to transition to Xarelto 2/1 as long as crt stable and insurance covers/is affordable   new onset atrial flutter > NSR CHA2DS2 - VASc is 6 - likely secondary to PE - TSH  2.006 - Cards following and directing medical care for this diagnosis - now on oral amio and BB  Acute on chronic diastolic CHF LE edema persists (pt also has DVTs) - Cardiology directing diuresis   Filed Weights   01/13/17 2202 01/14/17 0333 01/16/17 0500  Weight: 104.3 kg (230 lb) 96.1 kg (211 lb 13.8 oz) 102.4 kg (225 lb 12 oz)    near syncope secondary to acute bilateral PE - TTE w/ EF of 50-55%, no wall motion abnormalities  chronic kidney disease Unknown baseline - crt steadily improving   Recent Labs Lab 01/13/17 2010 01/14/17 1019 01/15/17 0440 01/16/17 0230 01/17/17 0416  CREATININE 1.39* 1.22* 1.29* 1.11* 1.16*   hypertension BP reasonably controlled    Hypomagnesemia Replace w/ goal of 2.0  hyperlipidemia Continue Vytorin  DM2 Hemoglobin A1c 8.3 - CBG variable - adjsut tx and follow   dementia with behavioral disturbance Stable - continue risperdal daily at bedtime.   DVT prophylaxis: IV heparin  Code Status: FULL CODE Family Communication: no family present at time of exam  Disposition Plan: transfer to tele bed - PT/OT - diurese - transition to Xarelto 2/1 if crt stable/improved   Consultants:  [facility] Cardiology    Antimicrobials:  None presently   Objective: Blood pressure 102/63, pulse 67, temperature 97.6 F (36.4 C), temperature source Oral, resp. rate (!) 29, height 5' 3.5" (1.613 m), weight 102.4 kg (225 lb 12 oz), SpO2 93 %.  Intake/Output Summary (Last 24 hours) at 01/17/17 1410 Last data filed at 01/17/17 1229  Gross per 24 hour  Intake            465.1 ml  Output             3575 ml  Net          -3109.9 ml   Filed Weights   01/13/17 2202 01/14/17 0333 01/16/17 0500  Weight: 104.3 kg (230 lb) 96.1 kg (211 lb 13.8 oz) 102.4 kg (225 lb 12 oz)    Examination: General: No acute respiratory distress at rest  Lungs: Distant breath sounds throughout all fields with no wheezing and no crackles Cardiovascular: Regular rate and rhythm without murmur gallop or rub normal S1 and S2 Abdomen: Nontender, nondistended, soft, bowel sounds positive, no rebound, no ascites,  no appreciable mass Extremities: 1+ bilateral lower extremity edema without cyanosis or clubbing  CBC:  Recent Labs Lab 01/13/17 2010 01/14/17 1019 01/15/17 0440 01/16/17 0230 01/17/17 0416  WBC 13.9* 10.1 10.6* 10.7* 10.7*  NEUTROABS 10.2* 6.8  --   --   --   HGB 14.7 12.2 12.0 11.7* 11.3*  HCT 46.0 38.7 38.7 37.8 35.3*  MCV 102.0* 102.4* 104.6* 101.6* 100.9*  PLT 216 176 158 184 [id number]   Basic Metabolic Panel:  Recent Labs Lab 01/13/17 2010 01/14/17 1019 01/15/17 0440 01/15/17 0807 01/16/17 0230 01/17/17 0416  NA 140  140 138  --  140 138  K 4.6 4.2 5.3* 3.5 3.7 3.6  CL 104 109 112*  --  108 101  CO2 22 20* 20*  --  23 22  GLUCOSE 235* 237* 180*  --  189* 134*  BUN 29* 24* 22*  --  17 18  CREATININE 1.39* 1.22* 1.29*  --  1.11* 1.16*  CALCIUM 9.2 7.9* 8.3*  --  8.6* 8.4*  MG  --  1.4* 2.2  --   --  1.2*   GFR: Estimated Creatinine Clearance: 44.6 mL/min (by C-G formula based on SCr of 1.16 mg/dL (H)).  Liver Function Tests:  Recent Labs Lab 01/13/17 2010 01/17/17 0416  AST 63* 17  ALT 48 18  ALKPHOS 117 94  BILITOT 0.4 0.6  PROT 6.8 5.6*  ALBUMIN 3.7 2.8*   Cardiac Enzymes:  Recent Labs Lab 01/13/17 2010 01/14/17 0143 01/14/17 0713 01/14/17 1019  TROPONINI <0.03 <0.03 <0.03 <0.03    HbA1C: Hgb A1c MFr Bld  Date/Time Value Ref Range Status  01/14/2017 01:43 AM 8.3 (H) 4.8 - 5.6 % Final    Comment:    (NOTE)         Pre-diabetes: 5.7 - 6.4         Diabetes: >6.4         Glycemic control for adults with diabetes: <7.0     CBG:  Recent Labs Lab 01/16/17 1511 01/16/17 1637 01/16/17 2131 01/17/17 0839 01/17/17 1227  GLUCAP 260* 191* 189* 163* 236*    Recent Results (from the past 240 hour(s))  Blood Culture (routine x 2)     Status: None (Preliminary result)   Collection Time: 01/13/17  8:10 PM  Result Value Ref Range Status   Specimen Description BLOOD RIGHT ANTECUBITAL  Final   Special Requests BOTTLES DRAWN AEROBIC AND ANAEROBIC 5CC EA  Final   Culture NO GROWTH 3 DAYS  Final   Report Status PENDING  Incomplete  Blood Culture (routine x 2)     Status: None (Preliminary result)   Collection Time: 01/13/17  8:17 PM  Result Value Ref Range Status   Specimen Description BLOOD LEFT ARM  Final   Special Requests IN PEDIATRIC BOTTLE 4CC  Final   Culture NO GROWTH 3 DAYS  Final   Report Status PENDING  Incomplete  Urine culture     Status: None   Collection Time: 01/13/17  9:31 PM  Result Value Ref Range Status   Specimen Description URINE, CATHETERIZED  Final    Special Requests NONE  Final   Culture NO GROWTH  Final   Report Status 01/15/2017 FINAL  Final  MRSA PCR Screening     Status: None   Collection Time: 01/14/17  3:39 AM  Result Value Ref Range Status   MRSA by PCR NEGATIVE NEGATIVE Final    Comment:        The  GeneXpert MRSA Assay (FDA approved for NASAL specimens only), is one component of a comprehensive MRSA colonization surveillance program. It is not intended to diagnose MRSA infection nor to guide or monitor treatment for MRSA infections.      Scheduled Meds: . amiodarone  200 mg Oral BID  . aspirin  81 mg Oral Daily  . atorvastatin  20 mg Oral q1800  . ezetimibe  10 mg Oral q1800  . furosemide  40 mg Intravenous BID  . insulin aspart  0-15 Units Subcutaneous TID WC  . insulin aspart  0-5 Units Subcutaneous QHS  . metoprolol tartrate  25 mg Oral BID  . nortriptyline  25 mg Oral QHS  . sodium chloride flush  3 mL Intravenous Q12H   Continuous Infusions: . heparin 1,700 Units/hr (01/17/17 0939)     LOS: 4 days   [name], MD [facility] Office  [phone] Pager - Text Page per Amion as per below:  On-Call/Text Page:      [email or web address]      password [email or web address]  If 7PM-7AM, please contact night-coverage [email or web address] Password [email or web address] 01/17/2017, 2:10 PM

## 2017-01-18 LAB — GLUCOSE, CAPILLARY
GLUCOSE-CAPILLARY: 164 mg/dL — AB (ref 65–99)
GLUCOSE-CAPILLARY: 153 mg/dL — AB (ref 65–99)
Glucose-Capillary: 133 mg/dL — ABNORMAL HIGH (ref 65–99)
Glucose-Capillary: 183 mg/dL — ABNORMAL HIGH (ref 65–99)

## 2017-01-18 LAB — CULTURE, BLOOD (ROUTINE X 2)
CULTURE: NO GROWTH
Culture: NO GROWTH

## 2017-01-18 LAB — CBC
HEMATOCRIT: 38.1 % (ref 36.0–46.0)
Hemoglobin: 12.1 g/dL (ref 12.0–15.0)
MCH: 31.8 pg (ref 26.0–34.0)
MCHC: 31.8 g/dL (ref 30.0–36.0)
MCV: 100.3 fL — AB (ref 78.0–100.0)
PLATELETS: 228 10*3/uL (ref 150–400)
RBC: 3.8 MIL/uL — ABNORMAL LOW (ref 3.87–5.11)
RDW: 15.4 % (ref 11.5–15.5)
WBC: 10 10*3/uL (ref 4.0–10.5)

## 2017-01-18 LAB — BASIC METABOLIC PANEL
ANION GAP: 11 (ref 5–15)
BUN: 20 mg/dL (ref 6–20)
CALCIUM: 8.7 mg/dL — AB (ref 8.9–10.3)
CO2: 26 mmol/L (ref 22–32)
Chloride: 101 mmol/L (ref 101–111)
Creatinine, Ser: 1.26 mg/dL — ABNORMAL HIGH (ref 0.44–1.00)
GFR calc Af Amer: 45 mL/min — ABNORMAL LOW (ref >60)
GFR, EST NON AFRICAN AMERICAN: 39 mL/min — AB (ref >60)
GLUCOSE: 133 mg/dL — AB (ref 65–99)
Potassium: 3.3 mmol/L — ABNORMAL LOW (ref 3.5–5.1)
Sodium: 138 mmol/L (ref 135–145)

## 2017-01-18 LAB — HEPARIN LEVEL (UNFRACTIONATED): Heparin Unfractionated: 0.37 IU/mL (ref 0.30–0.70)

## 2017-01-18 MED ORDER — RIVAROXABAN 20 MG PO TABS: 20.0000 mg | ORAL_TABLET | Freq: Every day | ORAL | Status: DC

## 2017-01-18 MED ORDER — POTASSIUM CHLORIDE CRYS ER 20 MEQ PO TBCR
20.0000 meq | EXTENDED_RELEASE_TABLET | Freq: Once | ORAL | Status: AC
  Administered 2017-01-18: 20 meq via ORAL
  Filled 2017-01-18: qty 1

## 2017-01-18 MED ORDER — RIVAROXABAN 15 MG PO TABS
15.0000 mg | ORAL_TABLET | Freq: Two times a day (BID) | ORAL | Status: DC
  Administered 2017-01-18 – 2017-01-20 (×5): 15 mg via ORAL
  Filled 2017-01-18 (×5): qty 1

## 2017-01-18 NOTE — Progress Notes (Signed)
Progress Note  Patient Name: Catherine Gonzales Date of Encounter: 01/18/2017  Primary Cardiologist: Dr. Stanford Breed  Subjective   The patient is feeling much better today, improved SOB. Laying flat.  Inpatient Medications    Scheduled Meds: . amiodarone  200 mg Oral BID  . aspirin  81 mg Oral Daily  . atorvastatin  20 mg Oral q1800  . ezetimibe  10 mg Oral q1800  . furosemide  40 mg Intravenous BID  . insulin aspart  0-15 Units Subcutaneous TID WC  . insulin aspart  0-5 Units Subcutaneous QHS  . metoprolol tartrate  25 mg Oral BID  . nortriptyline  25 mg Oral QHS  . rivaroxaban  15 mg Oral BID WC   Followed by  . [START ON 02/08/2017] rivaroxaban  20 mg Oral Q supper   Continuous Infusions: . sodium chloride 10 mL/hr at 01/17/17 1615   PRN Meds: acetaminophen **OR** acetaminophen, bisacodyl, HYDROcodone-acetaminophen, ondansetron **OR** ondansetron (ZOFRAN) IV, polyethylene glycol   Vital Signs    Vitals:   01/18/17 0329 01/18/17 0500 01/18/17 0830 01/18/17 1222  BP: 134/66  116/88 (!) 110/59  Pulse: 82  85 79  Resp: (!) 23  (!) 21 (!) 21  Temp: 98.2 F (36.8 C)  97.5 F (36.4 C) 97.9 F (36.6 C)  TempSrc: Oral  Oral Oral  SpO2: 94%  93% 92%  Weight:  219 lb 2.2 oz (99.4 kg)    Height:        Intake/Output Summary (Last 24 hours) at 01/18/17 1232 Last data filed at 01/18/17 1223  Gross per 24 hour  Intake            359.5 ml  Output             3500 ml  Net          -3140.5 ml   Filed Weights   01/14/17 0333 01/16/17 0500 01/18/17 0500  Weight: 211 lb 13.8 oz (96.1 kg) 225 lb 12 oz (102.4 kg) 219 lb 2.2 oz (99.4 kg)    Telemetry    Atrial flutter, ventricular rate 70s --> SR  ECG    No new tracings  Physical Exam   General: Well developed, well nourished, female appearing in mild acute distress. Head: Normocephalic, atraumatic.  Neck: Supple without bruits, JVD difficult to assess but is elevated, +HJR Lungs:  Resp regular and unlabored,  decreased BS bases w/ rales. Heart: RRR, S1, S2, no S3, S4, or murmur; no rub. Abdomen: Soft, non-tender, non-distended with normoactive bowel sounds. No hepatomegaly. No rebound/guarding. No obvious abdominal masses. Extremities: No clubbing, cyanosis, trace to 1+ edema in B LE.  Distal pedal pulses are 2+ bilaterally. Neuro: Alert and oriented X 3. Moves all extremities spontaneously. Psych: Normal affect.  Labs    Chemistry Recent Labs Lab 01/13/17 2010  01/16/17 0230 01/17/17 0416 01/18/17 0320  NA 140  < > 140 138 138  K 4.6  < > 3.7 3.6 3.3*  CL 104  < > 108 101 101  CO2 22  < > 23 22 26   GLUCOSE 235*  < > 189* 134* 133*  BUN 29*  < > 17 18 20   CREATININE 1.39*  < > 1.11* 1.16* 1.26*  CALCIUM 9.2  < > 8.6* 8.4* 8.7*  PROT 6.8  --   --  5.6*  --   ALBUMIN 3.7  --   --  2.8*  --   AST 63*  --   --  17  --   ALT 48  --   --  18  --   ALKPHOS 117  --   --  94  --   BILITOT 0.4  --   --  0.6  --   GFRNONAA 35*  < > 46* 43* 39*  GFRAA 40*  < > 53* 50* 45*  ANIONGAP 14  < > 9 15 11   < > = values in this interval not displayed.   Hematology  Recent Labs Lab 01/16/17 0230 01/17/17 0416 01/18/17 0320  WBC 10.7* 10.7* 10.0  RBC 3.72* 3.50* 3.80*  HGB 11.7* 11.3* 12.1  HCT 37.8 35.3* 38.1  MCV 101.6* 100.9* 100.3*  MCH 31.5 32.3 31.8  MCHC 31.0 32.0 31.8  RDW 15.4 15.5 15.4  PLT 184 205 228    Cardiac Enzymes  Recent Labs Lab 01/13/17 2010 01/14/17 0143 01/14/17 0713 01/14/17 1019  TROPONINI <0.03 <0.03 <0.03 <0.03     Recent Labs Lab 01/13/17 2023  TROPIPOC 0.00     BNP  Recent Labs Lab 01/13/17 2023  BNP 287.2*     Radiology    No results found.  Cardiac Studies   Echocardiogram 01/14/17: Study Conclusions - Left ventricle: The cavity size was normal. Wall thickness was   increased in a pattern of mild LVH. Systolic function was normal.   The estimated ejection fraction was in the range of 50% to 55%.   Wall motion was normal; there  were no regional wall motion   abnormalities. - Ventricular septum: The contour showed diastolic flattening and   systolic flattening. - Mitral valve: Calcified annulus. - Right ventricle: Systolic function was moderately reduced. - Atrial septum: The septum bowed from right to left, consistent   with increased right atrial pressure. - Tricuspid valve: There was moderate regurgitation. - Pulmonary arteries: Systolic pressure was moderately increased.   PA peak pressure: 51 mm Hg (S). Impressions: - Technically difficult; definity used; normal LV systolic   function; D shaped septum; moderately reduced RV function;   moderate TR; moderately elevated pulmonary pressure.  Patient Profile     83 y.o. female with past medical history of HTN, Type 2 DM, CKD, and dementia who presented to [facility] ED on 01/13/2017 for worsening dyspnea  Assessment & Plan    1.  atrial flutter-atrial arrhythmia  - likely related to the stress of her recent pulmonary embolus with increased pulmonary pressures and right atrial/right ventricular strain. Her blood pressure is borderline.  - she cardioverted spontaneously into the SR - iv amiodarone switched to PO amiodarone 200 mg po BID yesterday, short term until acute illness resolves - Continue lopressor 25 mg BID  - d/c heparin, continue xarelto - TSH is normal. Echocardiogram without LV dysfunction.  - CHADSvasc = 6 (female, age x 2, CHF, HTN, DM). She will probably require long-term anticoagulation as +arrhythmia, DVT & pulmonary embolus. There are no clear precipitating factors.  2. pulmonary embolus - started on Xarelto  3. Hypertension - controlled now  4. Acute on Chronic diastolic CHF: - great diuresis overnight - 3.4 L, Crea slightly up - decrease lasix to 20 mg BID   The plan is to mobilize, start PT&OT, transfer to telemetry  Ena Dawley, MD 01/18/2017

## 2017-01-18 NOTE — Progress Notes (Addendum)
Pt transferred via wheelchair with monitor and O2 to room 2W11, daughter at bedside.           After report pt insisted  on getting on toilet to try to have BM before she was transferred to another unit,  she declined to have female staff member assist her, therefore female staff assisted her as soon as they could.  She attempted,pt unable to have BM at this time. Pt assisted to wheelchair and transferred to 2W11 with her belongings as noted above, escorted by unit NT and SWOT RN.

## 2017-01-18 NOTE — Progress Notes (Signed)
Pt to transfer to tele, bed available on [address] [place], room 2W11. Attempted to give report, nurse not available at this moment, to return call.

## 2017-01-18 NOTE — Progress Notes (Signed)
[facility] for Heparin >> Xarelto Indication: pulmonary embolus  No Known Allergies  Patient Measurements: Height: 5' 3.5" (161.3 cm) Weight: 219 lb 2.2 oz (99.4 kg) IBW/kg (Calculated) : 53.55 Heparin Dosing Weight: 78 kg  Vital Signs: Temp: 98.2 F (36.8 C) (02/01 0329) Temp Source: Oral (02/01 0329) BP: 134/66 (02/01 0329) Pulse Rate: 82 (02/01 0329)  Labs:  Recent Labs  01/16/17 0230  01/17/17 0416 01/17/17 1823 01/18/17 0320  HGB 11.7*  --  11.3*  --  12.1  HCT 37.8  --  35.3*  --  38.1  PLT 184  --  205  --  228  HEPARINUNFRC 0.34  < > 0.45 0.42 0.37  CREATININE 1.11*  --  1.16*  --  1.26*  < > = values in this interval not displayed.  Estimated Creatinine Clearance: 40.4 mL/min (by C-G formula based on SCr of 1.26 mg/dL (H)).   Assessment: 83 y.o. F presents with SOB. On heparin for bilateral submassive PE with R heart strain (RV/LV 1.37), also bilateral DVT noted in peroneal veins on [place].   Heparin level this morning remains therapeutic (HL 0.37) however plans are now to transition to Xarelto. Xarelto appears to have been chosen over apixaban given it's eventual once daily administration per family preference. Give the patient's acute PE - the patient will still require the twice a day dosing for 21 days before transitioning to once a day dosing. Given the patient's advanced age - she will need to be monitored for bleeding events closely. CBC wnl this AM.  Goal of Therapy:  Heparin level 0.3-0.7 units/ml Monitor platelets by anticoagulation protocol: Yes  Appropriate anticoagulation for indication and hepatic/renal function    Plan:  1. Discontinue Heparin  2. Start Xarelto 15 mg bid with food for 21 days (thru 2/21) 3. On 2/22, transition to Xarelto 20 mg once daily with supper 4. Will plan to educate prior to discharge 5. Monitor for bleeding events closely given the patient's advanced age  Thank you for allowing  pharmacy to be a part of this patient's care.  Alycia Rossetti, PharmD, BCPS Clinical Pharmacist Pager: [phone] Clinical phone for 01/18/2017 from 7a-3:30p: [phone] If after 3:30p, please call main pharmacy at: [phone] 01/18/2017 7:56 AM

## 2017-01-18 NOTE — Progress Notes (Addendum)
Patient ID: Catherine Gonzales, female   DOB: December 11, 1934, 83 y.o.   MRN: [id number]                                                                PROGRESS NOTE                                                                                                                                                                                                             Patient Demographics:    Catherine Gonzales, is a 83 y.o. female, DOB - [birth year], [id number]  Admit date - 01/13/2017   Admitting Physician Vianne Bulls, MD  Outpatient Primary MD for the patient is No primary care provider on file. [employer] ?  LOS - 5  Outpatient Specialists:  Kirk Ruths  Chief Complaint  Patient presents with  . Shortness of Breath      Brief Narrative:  83 year old female w/ a history of hypertension, type 2 diabetes, dementia, chronic kidney disease with unknown baseline who presented to the ED with worsening shortness of breath, diaphoresis and a near syncopal episode. Her ED w/u revealed bilateral submassive pulmonary emboli with right ventricular strain and also atrial flutter with soft blood pressures.  Subjective: The patient is sitting up in a chair visiting with her best friend.  She denies current chest pain shortness breath fevers chills nausea or vomiting.  She is conversant but must Pauls at times during the conversation to catch her breath.  Assessment & Plan:  subacute submassive bilateral multilobar pulmonary emboli w/ right ventricular strain / acute hypoxic respiratory failure / bilateral peroneal DVT denies recent surgeries or long car rides or family history of DVT - no current history of neoplasm - Transition over to [place] today  new onset atrial flutter > NSR CHA2DS2 - VASc is 6 - likely secondary to PE - TSH 2.006 - Cards following and directing medical care for this diagnosis - now on oral amio and BB  Acute on chronic diastolic CHF LE edema persists (pt also has  DVTs) - Cardiology directing diuresis        Filed Weights   01/13/17 2202 01/14/17 0333 01/16/17 0500  Weight: 104.3 kg (230 lb) 96.1 kg (211 lb 13.8 oz) 102.4 kg (225 lb 12 oz)    near  syncope secondary to acute bilateral PE - TTE w/ EF of 50-55%, no wall motion abnormalities  chronic kidney disease Unknown baseline - crt steadily improving   Last Labs    Recent Labs Lab 01/13/17 2010 01/14/17 1019 01/15/17 0440 01/16/17 0230 01/17/17 0416  CREATININE 1.39* 1.22* 1.29* 1.11* 1.16*     hypertension BP reasonably controlled   Hypomagnesemia Replaced w/ goal of 2.0 repeat magnesium in am  Hypokalemia Replete Check cmp in am  hyperlipidemia D/c vytorin, currently on Lipitor due to drug interaction between simvastatin and amiodarone  DM2 Hemoglobin A1c8.3 - CBG variable - adjsut tx and follow   dementia with behavioral disturbance Stable - continue risperdal daily at bedtime.   DVT prophylaxis: IV heparin => xarelto Code Status: FULL CODE Family Communication: no family present at time of exam  Disposition Plan: transition to [place] today   Lab Results  Component Value Date   PLT 228 01/18/2017      Anti-infectives    Start     Dose/Rate Route Frequency Ordered Stop   01/14/17 1000  vancomycin (VANCOCIN) IVPB 750 mg/150 ml premix  Status:  Discontinued     750 mg 150 mL/hr over 60 Minutes Intravenous Every 12 hours 01/13/17 2158 01/14/17 0031   01/14/17 0600  piperacillin-tazobactam (ZOSYN) IVPB 3.375 g  Status:  Discontinued     3.375 g 12.5 mL/hr over 240 Minutes Intravenous Every 8 hours 01/13/17 2158 01/14/17 0031   01/13/17 2015  piperacillin-tazobactam (ZOSYN) IVPB 3.375 g     3.375 g 100 mL/hr over 30 Minutes Intravenous  Once 01/13/17 2005 01/13/17 2146   01/13/17 2015  vancomycin (VANCOCIN) IVPB 1000 mg/200 mL premix     1,000 mg 200 mL/hr over 60 Minutes Intravenous  Once 01/13/17 2005 01/13/17 2217        Objective:    Vitals:   01/17/17 1928 01/17/17 2252 01/18/17 0329 01/18/17 0500  BP: 110/71 126/76 134/66   Pulse: 92 80 82   Resp: (!) 21 (!) 30 (!) 23   Temp: 97.4 F (36.3 C) 97.4 F (36.3 C) 98.2 F (36.8 C)   TempSrc: Oral Oral Oral   SpO2: 91% 98% 94%   Weight:    99.4 kg (219 lb 2.2 oz)  Height:        Wt Readings from Last 3 Encounters:  01/18/17 99.4 kg (219 lb 2.2 oz)     Intake/Output Summary (Last 24 hours) at 01/18/17 0650 Last data filed at 01/18/17 0400  Gross per 24 hour  Intake           1064.6 ml  Output             3875 ml  Net          -2810.4 ml     Physical Exam  Awake Alert, Oriented X 3, No new F.N deficits, Normal affect [place].AT,PERRAL Supple Neck,No JVD, No cervical lymphadenopathy appriciated.  Symmetrical Chest wall movement, Good air movement bilaterally, CTAB RRR,No Gallops,Rubs or new Murmurs, No Parasternal Heave +ve B.Sounds, Abd Soft, No tenderness, No organomegaly appriciated, No rebound - guarding or rigidity. No Cyanosis, Clubbing or edema, No new Rash or bruise     Data Review:    CBC  Recent Labs Lab 01/13/17 2010 01/14/17 1019 01/15/17 0440 01/16/17 0230 01/17/17 0416 01/18/17 0320  WBC 13.9* 10.1 10.6* 10.7* 10.7* 10.0  HGB 14.7 12.2 12.0 11.7* 11.3* 12.1  HCT 46.0 38.7 38.7 37.8 35.3* 38.1  PLT 216 176 158  184 205 228  MCV 102.0* 102.4* 104.6* 101.6* 100.9* 100.3*  MCH 32.6 32.3 32.4 31.5 32.3 31.8  MCHC 32.0 31.5 31.0 31.0 32.0 31.8  RDW 15.4 15.2 15.3 15.4 15.5 15.4  LYMPHSABS 2.7 2.3  --   --   --   --   MONOABS 0.8 0.7  --   --   --   --   EOSABS 0.1 0.1  --   --   --   --   BASOSABS 0.0 0.0  --   --   --   --     Chemistries   Recent Labs Lab 01/13/17 2010 01/14/17 1019 01/15/17 0440 01/15/17 0807 01/16/17 0230 01/17/17 0416 01/18/17 0320  NA 140 140 138  --  140 138 138  K 4.6 4.2 5.3* 3.5 3.7 3.6 3.3*  CL 104 109 112*  --  108 101 101  CO2 22 20* 20*  --  23 22 26   GLUCOSE 235* 237* 180*  --  189*  134* 133*  BUN 29* 24* 22*  --  17 18 20   CREATININE 1.39* 1.22* 1.29*  --  1.11* 1.16* 1.26*  CALCIUM 9.2 7.9* 8.3*  --  8.6* 8.4* 8.7*  MG  --  1.4* 2.2  --   --  1.2*  --   AST 63*  --   --   --   --  17  --   ALT 48  --   --   --   --  18  --   ALKPHOS 117  --   --   --   --  94  --   BILITOT 0.4  --   --   --   --  0.6  --    ------------------------------------------------------------------------------------------------------------------ No results for input(s): CHOL, HDL, LDLCALC, TRIG, CHOLHDL, LDLDIRECT in the last 72 hours.  Lab Results  Component Value Date   HGBA1C 8.3 (H) 01/14/2017   ------------------------------------------------------------------------------------------------------------------ No results for input(s): TSH, T4TOTAL, T3FREE, THYROIDAB in the last 72 hours.  Invalid input(s): FREET3 ------------------------------------------------------------------------------------------------------------------ No results for input(s): VITAMINB12, FOLATE, FERRITIN, TIBC, IRON, RETICCTPCT in the last 72 hours.  Coagulation profile No results for input(s): INR, PROTIME in the last 168 hours.  No results for input(s): DDIMER in the last 72 hours.  Cardiac Enzymes  Recent Labs Lab 01/14/17 0143 01/14/17 0713 01/14/17 1019  TROPONINI <0.03 <0.03 <0.03   ------------------------------------------------------------------------------------------------------------------    Component Value Date/Time   BNP 287.2 (H) 01/13/2017 2023    Inpatient Medications  Scheduled Meds: . amiodarone  200 mg Oral BID  . aspirin  81 mg Oral Daily  . atorvastatin  20 mg Oral q1800  . ezetimibe  10 mg Oral q1800  . furosemide  40 mg Intravenous BID  . insulin aspart  0-15 Units Subcutaneous TID WC  . insulin aspart  0-5 Units Subcutaneous QHS  . metoprolol tartrate  25 mg Oral BID  . nortriptyline  25 mg Oral QHS   Continuous Infusions: . sodium chloride 10 mL/hr at  01/17/17 1615  . heparin 1,700 Units/hr (01/18/17 0100)   PRN Meds:.acetaminophen **OR** acetaminophen, bisacodyl, HYDROcodone-acetaminophen, ondansetron **OR** ondansetron (ZOFRAN) IV, polyethylene glycol  Micro Results Recent Results (from the past 240 hour(s))  Blood Culture (routine x 2)     Status: None (Preliminary result)   Collection Time: 01/13/17  8:10 PM  Result Value Ref Range Status   Specimen Description BLOOD RIGHT ANTECUBITAL  Final   Special Requests BOTTLES DRAWN AEROBIC  AND ANAEROBIC 5CC EA  Final   Culture NO GROWTH 4 DAYS  Final   Report Status PENDING  Incomplete  Blood Culture (routine x 2)     Status: None (Preliminary result)   Collection Time: 01/13/17  8:17 PM  Result Value Ref Range Status   Specimen Description BLOOD LEFT ARM  Final   Special Requests IN PEDIATRIC BOTTLE 4CC  Final   Culture NO GROWTH 4 DAYS  Final   Report Status PENDING  Incomplete  Urine culture     Status: None   Collection Time: 01/13/17  9:31 PM  Result Value Ref Range Status   Specimen Description URINE, CATHETERIZED  Final   Special Requests NONE  Final   Culture NO GROWTH  Final   Report Status 01/15/2017 FINAL  Final  MRSA PCR Screening     Status: None   Collection Time: 01/14/17  3:39 AM  Result Value Ref Range Status   MRSA by PCR NEGATIVE NEGATIVE Final    Comment:        The GeneXpert MRSA Assay (FDA approved for NASAL specimens only), is one component of a comprehensive MRSA colonization surveillance program. It is not intended to diagnose MRSA infection nor to guide or monitor treatment for MRSA infections.     Radiology Reports Dg Chest 2 View  Result Date: 12/27/2016 CLINICAL DATA:  Shortness of breath. EXAM: CHEST  2 VIEW COMPARISON:  04/24/2013. FINDINGS: Patient is rotated to the right. Mild cardiomegaly with normal pulmonary vascularity. Heart size stable. Mild right base infiltrate cannot be excluded. No pleural effusion or pneumothorax. Degenerative  changes thoracic spine . IMPRESSION: 1. Mild right base infiltrate cannot be excluded. 2.  Stable cardiomegaly. Electronically Signed   By: Marcello Moores  Register   On: 12/27/2016 14:20   Ct Angio Chest Pe W Or Wo Contrast  Result Date: 01/14/2017 CLINICAL DATA:  Acute dyspnea, diaphoresis and near syncope. EXAM: CT ANGIOGRAPHY CHEST WITH CONTRAST TECHNIQUE: Multidetector CT imaging of the chest was performed using the standard protocol during bolus administration of intravenous contrast. Multiplanar CT image reconstructions and MIPs were obtained to evaluate the vascular anatomy. CONTRAST:  80 cc Isovue 370 IV COMPARISON:  CXR from 01/13/2017 FINDINGS: Cardiovascular: Acute bilateral pulmonary emboli starting at the branch point for the right and left pulmonary arteries extending into both lower lobes, left upper lobe, lingula and right middle lobe to the level of the subsegmental branches. RV/LV ratio 1.37 consistent with right heart strain. Borderline cardiomegaly. Aortic atherosclerosis. Aortic atherosclerosis without dissection. Mediastinum/Nodes: Mild thyromegaly. No discrete mass however there is slight heterogeneous enhancement some which is due to streak artifacts from adjacent ribs. No mediastinal adenopathy. Small hiatal hernia. Lungs/Pleura: No pneumonic consolidation. Bibasilar atelectasis. Trace right pleural effusion. Upper Abdomen: Mild thickening of both adrenal glands. Cholecystectomy. Musculoskeletal: Mid thoracic degenerative disc disease and osteophyte formation consistent with spondylosis. Review of the MIP images confirms the above findings. IMPRESSION: Positive for acute PE, bilateral and multilobar with CT evidence of right heart strain (RV/LV Ratio = 1.37) consistent with at least submassive (intermediate risk) PE. The presence of right heart strain has been associated with an increased risk of morbidity and mortality. Please activate Code PE by paging [phone]. Critical Value/emergent  results were called by telephone at the time of interpretation on 01/14/2017 at 12:15 am to Dr. Mitzi Hansen , who verbally acknowledged these results. Mild thyromegaly. Mild adrenal thickening possibly hyperplastic. Cholecystectomy. Electronically Signed   By: Ashley Royalty M.D.   On:  01/14/2017 00:17   Dg Chest Port 1 View  Result Date: 01/16/2017 CLINICAL DATA:  Dyspnea, history of atrial flutter, diabetes, chronic renal insufficiency, nonsmoker EXAM: PORTABLE CHEST 1 VIEW COMPARISON:  Chest x-ray of January 13, 2017 FINDINGS: The lungs are adequately inflated. The interstitial markings have become more conspicuous bilaterally. There is mild pulmonary vascular congestion. The cardiac silhouette is enlarged. There is tortuosity of the ascending and descending thoracic aorta. There is calcification in the wall of the aortic arch. There is no pleural effusion. The observed bony thorax is unremarkable. IMPRESSION: Mild pulmonary interstitial edema likely due to mild CHF. No alveolar pneumonia nor significant pleural effusion. Thoracic aortic atherosclerosis. Electronically Signed   By: David  Martinique M.D.   On: 01/16/2017 09:42   Dg Chest Portable 1 View  Result Date: 01/13/2017 CLINICAL DATA:  Difficulty breathing EXAM: PORTABLE CHEST 1 VIEW COMPARISON:  12/27/2016 FINDINGS: Cardiac shadow is again enlarged but stable. Lungs are well aerated bilaterally. No focal infiltrate or sizable effusion is seen. No bony abnormality is noted. IMPRESSION: No active disease. Electronically Signed   By: Inez Catalina M.D.   On: 01/13/2017 20:44    Time Spent in minutes  30   Jani Gravel M.D on 01/18/2017 at 6:50 AM  Between 7am to 7pm - Pager - [phone]  After 7pm go to [email or web address] - password [facility]  [facility] -  Office  [phone]

## 2017-01-18 NOTE — Discharge Instructions (Addendum)
Information on my medicine - XARELTO (rivaroxaban)  This medication education was reviewed with me or my healthcare representative as part of my discharge preparation.    WHY WAS XARELTO PRESCRIBED FOR YOU? Xarelto was prescribed to treat blood clots that may have been found in the veins of your legs (deep vein thrombosis) or in your lungs (pulmonary embolism) and to reduce the risk of them occurring again.  What do you need to know about Xarelto? The starting dose is one 15 mg tablet taken TWICE daily with food for the FIRST 21 DAYS then on Thursday, February 22nd, 2018 the dose is changed to one 20 mg tablet taken ONCE A DAY with your evening meal.  DO NOT stop taking Xarelto without talking to the health care provider who prescribed the medication.  Refill your prescription for 20 mg tablets before you run out.  After discharge, you should have regular check-up appointments with your healthcare provider that is prescribing your Xarelto.  In the future your dose may need to be changed if your kidney function changes by a significant amount.  What do you do if you miss a dose? If you are taking Xarelto TWICE DAILY and you miss a dose, take it as soon as you remember. You may take two 15 mg tablets (total 30 mg) at the same time then resume your regularly scheduled 15 mg twice daily the next day.  If you are taking Xarelto ONCE DAILY and you miss a dose, take it as soon as you remember on the same day then continue your regularly scheduled once daily regimen the next day. Do not take two doses of Xarelto at the same time.   Important Safety Information Xarelto is a blood thinner medicine that can cause bleeding. You should call your healthcare provider right away if you experience any of the following: ? Bleeding from an injury or your nose that does not stop. ? Unusual colored urine (red or dark brown) or unusual colored stools (red or black). ? Unusual bruising for unknown  reasons. ? A serious fall or if you hit your head (even if there is no bleeding).  Some medicines may interact with Xarelto and might increase your risk of bleeding while on Xarelto. To help avoid this, consult your healthcare provider or pharmacist prior to using any new prescription or non-prescription medications, including herbals, vitamins, non-steroidal anti-inflammatory drugs (NSAIDs) and supplements.  This website has more information on Xarelto: [email or web address].[email or web address]    Pulmonary Embolism A pulmonary embolism (PE) is a sudden blockage or decrease of blood flow in one lung or both lungs. Most blockages come from a blood clot that travels from the legs or the pelvis to the lungs. PE is a dangerous and potentially life-threatening condition if it is not treated right away. What are the causes? A pulmonary embolism occurs most commonly when a blood clot travels from one of your veins to your lungs. Rarely, PE is caused by air, fat, amniotic fluid, or part of a tumor traveling through your veins to your lungs. What increases the risk? A PE is more likely to develop in:  People who smoke.  People who areolder, especially over 73 years of age.  People who are overweight (obese).  People who sit or lie still for a long time, such as during long-distance travel (over 4 hours), bed rest, hospitalization, or during recovery from certain medical conditions like a stroke.  People who do not engage in much physical activity (sedentary lifestyle).  People who have chronic breathing disorders.  People whohave a personal or family history of blood clots or blood clotting disease.  People whohave peripheral vascular disease (PVD), diabetes, or some types of cancer.  People who haveheart disease, especially if the person had a recent heart attack or has congestive heart failure.  People who have neurological diseases that affect the legs (leg paresis).  People who have had a traumatic  injury, such as breaking a hip or leg.  People whohave recently had major or lengthy surgery, especially on the hip, knee, or abdomen.  People who have hada central line placed inside a large vein.  People who takemedicines that contain the hormone estrogen. These include birth control pills and hormone replacement therapy.  Pregnancy or during childbirth or the postpartum period. What are the signs or symptoms? The symptoms of a PE usually start suddenly and include:  Shortness of breath while active or at rest.  Coughing or coughing up blood or blood-tinged mucus.  Chest pain that is often worse with deep breaths.  Rapid or irregular heartbeat.  Feeling light-headed or dizzy.  Fainting.  Feelinganxious.  Sweating. There may also be pain and swelling in a leg if that is where the blood clot started. These symptoms may represent a serious problem that is an emergency. Do not wait to see if the symptoms will go away. Get medical help right away. Call your local emergency services (911 in the [place] Do not drive yourself to the hospital.  How is this diagnosed? Your health care provider will take a medical history and perform a physical exam. You may also have other tests, including:  Blood tests to assess the clotting properties of your blood, assess oxygen levels in your blood, and find blood clots.  Imaging tests, such as CT, ultrasound, MRI, X-ray, and other tests to see if you have clots anywhere in your body.  An electrocardiogram (ECG) to look for heart strain from blood clots in the lungs. How is this treated? The main goals of PE treatment are:  To stop a blood clot from growing larger.  To stop new blood clots from forming. The type of treatment that you receive depends on many factors, such as the cause of your PE, your risk for bleeding or developing more clots, and other medical conditions that you have. Sometimes, a combination of treatments is  necessary. This condition may be treated with:  Medicines, including newer oral blood thinners (anticoagulants), warfarin, low molecular weight heparins, thrombolytics, or heparins.  Wearing compression stockings or using different types of devices.  Surgery (rare) to remove the blood clot or to place a filter in your abdomen to stop the blood clot from traveling to your lungs. Treatments for a PE are often divided into immediate treatment, long-term treatment (up to 3 months after PE), and extended treatment (more than 3 months after PE). Your treatment may continue for several months. This is called maintenance therapy, and it is used to prevent the forming of new blood clots. You can work with your health care provider to choose the treatment program that is best for you. What are anticoagulants?  Anticoagulants are medicines that treat PEs. They can stop current blood clots from growing and stop new clots from forming. They cannot dissolve existing clots. Your body dissolves clots by itself over time. Anticoagulants are given by mouth, by injection, or through an IV tube. What are thrombolytics?  Thrombolytics are clot-dissolving medicines that are used to dissolve a  PE. They carry a high risk of bleeding, so they tend to be used only in severe cases or if you have very low blood pressure. Follow these instructions at home: If you are taking a newer oral anticoagulant:  Take the medicine every single day at the same time each day.  Understand what foods and drugs interact with this medicine.  Understand that there are no regular blood tests required when using this medicine.  Understandthe side effects of this medicine, including excessive bruising or bleeding. Ask your health care provider or pharmacist about other possible side effects. If you are taking warfarin:  Understand how to take warfarin and know which foods can affect how warfarin works in Veterinary surgeon.  Understand that it is  dangerous to taketoo much or too little warfarin. Too much warfarin increases the risk of bleeding. Too little warfarin continues to allow the risk for blood clots.  Follow your PT and INR blood testing schedule. The PT and INR results allow your health care provider to adjust your dose of warfarin. It is very important that you have your PT and INR tested as often as told by your health care provider.  Avoid major changes in your diet, or tell your health care provider before you change your diet. Arrange a visit with a registered dietitian to answer your questions. Many foods, especially foods that are high in vitamin K, can interfere with warfarin and affect the PT and INR results. Eat a consistent amount of foods that are high in vitamin K, such as:  Spinach, kale, broccoli, cabbage, collard greens, turnip greens, Brussels sprouts, peas, cauliflower, seaweed, and parsley.  Beef liver and pork liver.  Green tea.  Soybean oil.  Tell your health care provider about any and all medicines, vitamins, and supplements that you take, including aspirin and other over-the-counter anti-inflammatory medicines. Be especially cautious with aspirin and anti-inflammatory medicines. Do not take those before you ask your health care provider if it is safe to do so. This is important because many medicines can interfere with warfarin and affect the PT and INR results.  Do not start or stop taking any over-the-counter or prescription medicine unless your health care provider or pharmacist tells you to do so. If you take warfarin, you will also need to do these things:  Hold pressure over cuts for longer than usual.  Tell your dentist and other health care providers that you are taking warfarin before you have any procedures in which bleeding may occur.  Avoid alcohol or drink very small amounts. Tell your health care provider if you change your alcohol intake.  Do not use tobacco products, including  cigarettes, chewing tobacco, and e-cigarettes. If you need help quitting, ask your health care provider.  Avoid contact sports. General instructions  Take over-the-counter and prescription medicines only as told by your health care provider. Anticoagulant medicines can have side effects, including easy bruising and difficulty stopping bleeding. If you are prescribed an anticoagulant, you will also need to do these things:  Hold pressure over cuts for longer than usual.  Tell your dentist and other health care providers that you are taking anticoagulants before you have any procedures in which bleeding may occur.  Avoid contact sports.  Wear a medical alert bracelet or carry a medical alert card that says you have had a PE.  Ask your health care provider how soon you can go back to your normal activities. Stay active to prevent new blood clots from forming.  Make sure to exercise while traveling or when you have been sitting or standing for a long period of time. It is very important to exercise. Exercise your legs by walking or by tightening and relaxing your leg muscles often. Take frequent walks.  Wear compression stockings as told by your health care provider to help prevent more blood clots from forming.  Do not use tobacco products, including cigarettes, chewing tobacco, and e-cigarettes. If you need help quitting, ask your health care provider.  Keep all follow-up appointments with your health care provider. This is important. How is this prevented? Take these actions to decrease your risk of developing another PE:  Exercise regularly. For at least 30 minutes every day, engage in:  Activity that involves moving your arms and legs.  Activity that encourages good blood flow through your body by increasing your heart rate.  Exercise your arms and legs every hour during long-distance travel (over 4 hours). Drink plenty of water and avoid drinking alcohol while traveling.  Avoid  sitting or lying in bed for long periods of time without moving your legs.  Maintain a weight that is appropriate for your height. Ask your health care provider what weight is healthy for you.  If you are a woman who is over 49 years of age, avoid unnecessary use of medicines that contain estrogen. These include birth control pills.  Do not smoke, especially if you take estrogen medicines. If you need help quitting, ask your health care provider.  If you are at very high risk for PE, wear compression stockings.  If you recently had a PE, have regularly scheduled ultrasound testing on your legs to check for new blood clots. If you are hospitalized, prevention measures may include:  Early walking after surgery, as soon as your health care provider says that it is safe.  Receiving anticoagulants to prevent blood clots. If you cannot take anticoagulants, other options may be available, such as wearing compression stockings or using different types of devices. Get help right away if:  You have new or increased pain, swelling, or redness in an arm or leg.  You have numbness or tingling in an arm or leg.  You have shortness of breath while active or at rest.  You have chest pain.  You have a rapid or irregular heartbeat.  You feel light-headed or dizzy.  You cough up blood.  You notice blood in your vomit, bowel movement, or urine.  You have a fever. These symptoms may represent a serious problem that is an emergency. Do not wait to see if the symptoms will go away. Get medical help right away. Call your local emergency services (911 in the [place] Do not drive yourself to the hospital.  This information is not intended to replace advice given to you by your health care provider. Make sure you discuss any questions you have with your health care provider. Document Released: 12/01/2000 Document Revised: 05/11/2016 Document Reviewed: 03/31/2015 Elsevier Interactive Patient Education   2017 [place].   Atrial Fibrillation Introduction Atrial fibrillation is a type of heartbeat that is irregular or fast (rapid). If you have this condition, your heart keeps quivering in a weird (chaotic) way. This condition can make it so your heart cannot pump blood normally. Having this condition gives a person more risk for stroke, heart failure, and other heart problems. There are different types of atrial fibrillation. Talk with your doctor to learn about the type that you have. Follow these instructions at home:  Take over-the-counter and prescription medicines only as told by your doctor.  If your doctor prescribed a blood-thinning medicine, take it exactly as told. Taking too much of it can cause bleeding. If you do not take enough of it, you will not have the protection that you need against stroke and other problems.  Do not use any tobacco products. These include cigarettes, chewing tobacco, and e-cigarettes. If you need help quitting, ask your doctor.  If you have apnea (obstructive sleep apnea), manage it as told by your doctor.  Do not drink alcohol.  Do not drink beverages that have caffeine. These include coffee, soda, and tea.  Maintain a healthy weight. Do not use diet pills unless your doctor says they are safe for you. Diet pills may make heart problems worse.  Follow diet instructions as told by your doctor.  Exercise regularly as told by your doctor.  Keep all follow-up visits as told by your doctor. This is important. Contact a doctor if:  You notice a change in the speed, rhythm, or strength of your heartbeat.  You are taking a blood-thinning medicine and you notice more bruising.  You get tired more easily when you move or exercise. Get help right away if:  You have pain in your chest or your belly (abdomen).  You have sweating or weakness.  You feel sick to your stomach (nauseous).  You notice blood in your throw up (vomit), poop (stool), or pee  (urine).  You are short of breath.  You suddenly have swollen feet and ankles.  You feel dizzy.  Your suddenly get weak or numb in your face, arms, or legs, especially if it happens on one side of your body.  You have trouble talking, trouble understanding, or both.  Your face or your eyelid droops on one side. These symptoms may be an emergency. Do not wait to see if the symptoms will go away. Get medical help right away. Call your local emergency services (911 in the [place] Do not drive yourself to the hospital.  This information is not intended to replace advice given to you by your health care provider. Make sure you discuss any questions you have with your health care provider. Document Released: 09/12/2008 Document Revised: 05/11/2016 Document Reviewed: 03/31/2015  2017 Elsevier

## 2017-01-18 NOTE — Progress Notes (Signed)
Attempted to call report again without success. 

## 2017-01-18 NOTE — Progress Notes (Signed)
Report given to RN Nira Conn on [facility] [address].

## 2017-01-18 NOTE — Evaluation (Signed)
Occupational Therapy Evaluation Patient Details Name: Catherine Gonzales MRN: [id number] DOB: 09-28-1934 Today's Date: 01/18/2017    History of Present Illness Pt is an 83 y/o female admitted from home secondary to acute SOB and near syncope, found to have an acute PE with heart strain. PMH including but not limited to mild dementia, DM, CKD and HTN.   Clinical Impression   Pt was independent at her baseline. Presents with decreased activity tolerance and impaired standing balance. Began education in energy conservation and home safety. Pt overall performing ADL and mobility at a min to min guard level. Will have daughter available to supervise at home. Will follow acutely.    Follow Up Recommendations  No OT follow up    Equipment Recommendations  None recommended by OT    Recommendations for Other Services       Precautions / Restrictions Precautions Precautions: Fall Restrictions Weight Bearing Restrictions: No      Mobility Bed Mobility               General bed mobility comments: pt sitting OOB in recliner when OT entered room  Transfers Overall transfer level: Needs assistance Equipment used: None Transfers: Sit to/from Stand Sit to Stand: Min guard         General transfer comment: pt required increased time, min guard for safety    Balance     Sitting balance-Leahy Scale: Good       Standing balance-Leahy Scale: Fair                              ADL Overall ADL's : Needs assistance/impaired Eating/Feeding: Independent;Sitting   Grooming: Wash/dry hands;Wash/dry face;Standing;Min guard   Upper Body Bathing: Set up;Sitting   Lower Body Bathing: Min guard;Sit to/from stand   Upper Body Dressing : Minimal assistance;Sitting Upper Body Dressing Details (indicate cue type and reason): assist due to lines Lower Body Dressing: Minimal assistance;Sit to/from stand Lower Body Dressing Details (indicate cue type and reason): pt  fatigues with bending over to don socks Toilet Transfer: Min guard;Ambulation;BSC   Toileting- Clothing Manipulation and Hygiene: Minimal assistance;Sit to/from stand       Functional mobility during ADLs: Min guard General ADL Comments: Educated pt in energy conservation strategies and benefits of seated showering.     Vision     Perception     Praxis      Pertinent Vitals/Pain Pain Assessment: Faces Faces Pain Scale: Hurts a little bit Pain Location: R shoulder Pain Descriptors / Indicators: Sore Pain Intervention(s): Monitored during session     Hand Dominance Right   Extremity/Trunk Assessment Upper Extremity Assessment Upper Extremity Assessment: Overall WFL for tasks assessed;RUE deficits/detail RUE Deficits / Details: soreness in shoulder, full AROM   Lower Extremity Assessment Lower Extremity Assessment: Overall WFL for tasks assessed       Communication Communication Communication: No difficulties   Cognition Arousal/Alertness: Awake/alert Behavior During Therapy: WFL for tasks assessed/performed Overall Cognitive Status: History of cognitive impairments - at baseline                 General Comments: pt with mild dementia at baseline; however, WFL for simple questions asked during evaluation.   General Comments       Exercises       Shoulder Instructions      Home Living Family/patient expects to be discharged to:: Private residence Living Arrangements: Alone Available Help at Discharge: Family;Available 24  hours/day (daughter plans to stay with her, son lives nearby) Type of Home: House Home Access: Stairs to enter [employer] of Steps: 2 Entrance Stairs-Rails: Right Home Layout: One level     Bathroom Shower/Tub: Tub/shower unit;Walk-in shower   Bathroom Toilet: Standard     Home Equipment: None   Additional Comments: pt has access to a tub seat from a family member      Prior Functioning/Environment Level of  Independence: Independent                 OT Problem List: Decreased activity tolerance;Decreased knowledge of use of DME or AE;Decreased cognition;Impaired balance (sitting and/or standing);Obesity;Pain   OT Treatment/Interventions: Self-care/ADL training;Energy conservation;Patient/family education;Therapeutic activities;DME and/or AE instruction    OT Goals(Current goals can be found in the care plan section) Acute Rehab OT Goals Patient Stated Goal: return home OT Goal Formulation: With patient Time For Goal Achievement: 01/25/17 Potential to Achieve Goals: Good ADL Goals Pt Will Perform Grooming: with modified independence;standing Pt Will Perform Lower Body Bathing: with modified independence;sit to/from stand Pt Will Perform Lower Body Dressing: with modified independence;sit to/from stand Pt Will Transfer to Toilet: with modified independence;ambulating;regular height toilet Pt Will Perform Toileting - Clothing Manipulation and hygiene: with modified independence;sit to/from stand Pt Will Perform Tub/Shower Transfer: Shower transfer;ambulating;shower seat;with supervision Additional ADL Goal #1: Pt will utilize energy conservation strategies in ADL and mobility with min verbal cues.  OT Frequency: Min 2X/week   Barriers to D/C:            Co-evaluation              End of Session Equipment Utilized During Treatment: Gait belt;Oxygen  Activity Tolerance: Patient limited by fatigue Patient left: in chair;with call bell/phone within reach   Time: 0840-0900 OT Time Calculation (min): 20 min Charges:  OT General Charges $OT Visit: 1 Procedure OT Evaluation $OT Eval Moderate Complexity: 1 Procedure G-Codes:    Malka So 01/18/2017, 9:14 AM  [phone]

## 2017-01-19 DIAGNOSIS — E119 Type 2 diabetes mellitus without complications: Secondary | ICD-10-CM

## 2017-01-19 LAB — CBC
HCT: 40.3 % (ref 36.0–46.0)
HEMOGLOBIN: 13 g/dL (ref 12.0–15.0)
MCH: 32.3 pg (ref 26.0–34.0)
MCHC: 32.3 g/dL (ref 30.0–36.0)
MCV: 100 fL (ref 78.0–100.0)
Platelets: 256 10*3/uL (ref 150–400)
RBC: 4.03 MIL/uL (ref 3.87–5.11)
RDW: 15 % (ref 11.5–15.5)
WBC: 9 10*3/uL (ref 4.0–10.5)

## 2017-01-19 LAB — COMPREHENSIVE METABOLIC PANEL
ALK PHOS: 104 U/L (ref 38–126)
ALT: 19 U/L (ref 14–54)
AST: 21 U/L (ref 15–41)
Albumin: 3.3 g/dL — ABNORMAL LOW (ref 3.5–5.0)
Anion gap: 13 (ref 5–15)
BUN: 24 mg/dL — AB (ref 6–20)
CALCIUM: 9.2 mg/dL (ref 8.9–10.3)
CO2: 27 mmol/L (ref 22–32)
CREATININE: 1.48 mg/dL — AB (ref 0.44–1.00)
Chloride: 99 mmol/L — ABNORMAL LOW (ref 101–111)
GFR calc non Af Amer: 32 mL/min — ABNORMAL LOW (ref >60)
GFR, EST AFRICAN AMERICAN: 37 mL/min — AB (ref >60)
Glucose, Bld: 144 mg/dL — ABNORMAL HIGH (ref 65–99)
Potassium: 3.7 mmol/L (ref 3.5–5.1)
SODIUM: 139 mmol/L (ref 135–145)
Total Bilirubin: 0.9 mg/dL (ref 0.3–1.2)
Total Protein: 6.2 g/dL — ABNORMAL LOW (ref 6.5–8.1)

## 2017-01-19 LAB — GLUCOSE, CAPILLARY
GLUCOSE-CAPILLARY: 155 mg/dL — AB (ref 65–99)
GLUCOSE-CAPILLARY: 139 mg/dL — AB (ref 65–99)
Glucose-Capillary: 190 mg/dL — ABNORMAL HIGH (ref 65–99)
Glucose-Capillary: 187 mg/dL — ABNORMAL HIGH (ref 65–99)

## 2017-01-19 LAB — MAGNESIUM: Magnesium: 1.5 mg/dL — ABNORMAL LOW (ref 1.7–2.4)

## 2017-01-19 MED ORDER — AMIODARONE HCL IN DEXTROSE 360-4.14 MG/200ML-% IV SOLN
60.0000 mg/h | INTRAVENOUS | Status: DC
  Administered 2017-01-19 (×2): 60 mg/h via INTRAVENOUS
  Filled 2017-01-19 (×2): qty 200

## 2017-01-19 MED ORDER — FUROSEMIDE 40 MG PO TABS
40.0000 mg | ORAL_TABLET | Freq: Two times a day (BID) | ORAL | Status: DC
  Administered 2017-01-19 – 2017-01-20 (×2): 40 mg via ORAL
  Filled 2017-01-19 (×2): qty 1

## 2017-01-19 MED ORDER — AMIODARONE LOAD VIA INFUSION
150.0000 mg | Freq: Once | INTRAVENOUS | Status: AC
  Administered 2017-01-19: 150 mg via INTRAVENOUS
  Filled 2017-01-19: qty 83.34

## 2017-01-19 MED ORDER — AMIODARONE HCL IN DEXTROSE 360-4.14 MG/200ML-% IV SOLN
30.0000 mg/h | INTRAVENOUS | Status: DC
  Administered 2017-01-19: 30 mg/h via INTRAVENOUS

## 2017-01-19 MED ORDER — MAGNESIUM SULFATE 2 GM/50ML IV SOLN
2.0000 g | Freq: Once | INTRAVENOUS | Status: AC
  Administered 2017-01-19: 2 g via INTRAVENOUS
  Filled 2017-01-19: qty 50

## 2017-01-19 MED ORDER — POTASSIUM CHLORIDE CRYS ER 20 MEQ PO TBCR
20.0000 meq | EXTENDED_RELEASE_TABLET | Freq: Two times a day (BID) | ORAL | Status: DC
  Administered 2017-01-19 – 2017-01-20 (×3): 20 meq via ORAL
  Filled 2017-01-19 (×3): qty 1

## 2017-01-19 MED ORDER — FUROSEMIDE 40 MG PO TABS
40.0000 mg | ORAL_TABLET | Freq: Every day | ORAL | Status: DC
  Administered 2017-01-19: 40 mg via ORAL
  Filled 2017-01-19: qty 1

## 2017-01-19 MED ORDER — SENNOSIDES-DOCUSATE SODIUM 8.6-50 MG PO TABS
1.0000 | ORAL_TABLET | Freq: Two times a day (BID) | ORAL | Status: DC
  Administered 2017-01-19 – 2017-01-20 (×3): 1 via ORAL
  Filled 2017-01-19 (×3): qty 1

## 2017-01-19 NOTE — Progress Notes (Signed)
Physical Therapy Treatment Patient Details Name: Catherine Gonzales MRN: [id number] DOB: 1934-06-21 Today's Date: 01/19/2017    History of Present Illness Pt is an 83 y/o female admitted from home secondary to acute SOB and near syncope, found to have an acute PE with heart strain. PMH including but not limited to mild dementia, DM, CKD and HTN.    PT Comments    Pt very pleasant and eager to walk. Pt able to maintain sats 90-93% on RA with all of gait with HR 124-130 with pt unaware of elevated rate. Pt encouraged to continue RW use and bil LE HEP. Will continue to follow.    Follow Up Recommendations  No PT follow up;Supervision - Intermittent     Equipment Recommendations  Rolling walker with 5" wheels    Recommendations for Other Services       Precautions / Restrictions Precautions Precautions: Fall    Mobility  Bed Mobility               General bed mobility comments: pt sitting OOB in recliner   Transfers Overall transfer level: Needs assistance   Transfers: Sit to/from Stand Sit to Stand: Supervision         General transfer comment: cues for safety and lines  Ambulation/Gait Ambulation/Gait assistance: Supervision Ambulation Distance (Feet): 600 Feet Assistive device: Rolling walker (2 wheeled) Gait Pattern/deviations: Step-through pattern;Decreased stride length   Gait velocity interpretation: at or above normal speed for age/gender General Gait Details: pt very cautious with initial gait without RW and slightly unsteady, utilized RW after 30' with improved stride, stability and speed, encouraged RW    Stairs Stairs: Yes   Stair Management: Step to pattern;Forwards;One rail Left Number of Stairs: 2    Wheelchair Mobility    Modified Rankin (Stroke Patients Only)       Balance Overall balance assessment: Needs assistance   Sitting balance-Leahy Scale: Good       Standing balance-Leahy Scale: Fair                       Cognition Arousal/Alertness: Awake/alert Behavior During Therapy: WFL for tasks assessed/performed Overall Cognitive Status: History of cognitive impairments - at baseline                      Exercises General Exercises - Lower Extremity Long Arc Quad: AROM;15 reps;Seated;Both Hip Flexion/Marching: AROM;Both;Seated;15 reps    General Comments        Pertinent Vitals/Pain Pain Assessment: No/denies pain    Home Living                      Prior Function            PT Goals (current goals can now be found in the care plan section) Progress towards PT goals: Progressing toward goals    Frequency           PT Plan Current plan remains appropriate;Equipment recommendations need to be updated    Co-evaluation             End of Session Equipment Utilized During Treatment: Gait belt Activity Tolerance: Patient tolerated treatment well Patient left: in chair;with call bell/phone within reach     Time: 1024-1051 PT Time Calculation (min) (ACUTE ONLY): 27 min  Charges:  $Gait Training: 8-22 mins $Therapeutic Activity: 8-22 mins  G Codes:      Sandy Salaam Dhyana Bastone 01/19/2017, 12:05 PM Elwyn Reach, [place]

## 2017-01-19 NOTE — Progress Notes (Signed)
[place] TEAM 1 - Stepdown/ICU TEAM  Catherine Gonzales  [id number] DOB: [birth year] DOA: 01/13/2017 PCP: No primary care provider on file.    Brief Narrative:  83 year old female w/ a history of hypertension, type 2 diabetes, dementia, chronic kidney disease with unknown baseline who presented to the ED with worsening shortness of breath, diaphoresis and a near syncopal episode. Her ED w/u revealed bilateral submassive pulmonary emboli with right ventricular strain and also atrial flutter with soft blood pressures.  Subjective: The patient is sitting in a bedside chair eating her breakfast.  She is alert oriented and quite pleasant.  She reports a good appetite.  She is anxious to be discharged home.  She denies chest pain nausea vomiting or abdominal pain.  She is not aware when her heart is racing.  Assessment & Plan:  subacute submassive bilateral multilobar pulmonary emboli w/ right ventricular strain / acute hypoxic respiratory failure / bilateral peroneal DVT denies recent surgeries or long car rides or family history of DVT - no current history of neoplasm - transitioned to Xarelto without difficulty  new onset atrial flutter > NSR CHA2DS2 - VASc is 6 - likely secondary to PE - TSH  2.006 - Cards following and directing medical care for this diagnosis - now on amio and BB - suffered an episode of RVR this morning with transition back to IV amiodarone  Acute on chronic diastolic CHF LE edema much improved - Cardiology directing diuresis - net negative ~3.8L thus far   [facility] Weights   01/16/17 0500 01/18/17 0500 01/19/17 0620  Weight: 102.4 kg (225 lb 12 oz) 99.4 kg (219 lb 2.2 oz) 94.5 kg (208 lb 5.4 oz)    near syncope secondary to acute bilateral PE - TTE w/ EF of 50-55%, no wall motion abnormalities  chronic kidney disease Unknown baseline - crt increased slightly w/ diuresis - follow trend    Recent Labs Lab 01/15/17 0440 01/16/17 0230 01/17/17 0416 01/18/17 0320  01/19/17 0233  CREATININE 1.29* 1.11* 1.16* 1.26* 1.48*   hypertension BP reasonably controlled   Hypomagnesemia Cont to replace w/ goal of 2.0  hyperlipidemia Continue Vytorin  DM2 Hemoglobin A1c 8.3 - CBG reasonably controlled - follow without change today    dementia with behavioral disturbance Stable - continue risperdal daily at bedtime   DVT prophylaxis: Xarelto Code Status: FULL CODE Family Communication: no family present at time of exam  Disposition Plan: PT/OT will do not feel further therapies are required - plan for discharge home when heart rate better controlled and renal function proven to be stable  Consultants:  [facility]   Antimicrobials:  None    Objective: Blood pressure 133/76, pulse (!) 109, temperature 97.9 F (36.6 C), temperature source Oral, resp. rate 18, height 5' 3.5" (1.613 m), weight 94.5 kg (208 lb 5.4 oz), SpO2 95 %.  Intake/Output Summary (Last 24 hours) at 01/19/17 0749 Last data filed at 01/19/17 [id number]  Gross per 24 hour  Intake              720 ml  Output             3500 ml  Net            -2780 ml   Filed Weights   01/16/17 0500 01/18/17 0500 01/19/17 0620  Weight: 102.4 kg (225 lb 12 oz) 99.4 kg (219 lb 2.2 oz) 94.5 kg (208 lb 5.4 oz)    Examination: General: No acute respiratory distress at  rest  Lungs: Distant breath sounds throughout all fields with no wheezing and no crackles Cardiovascular: Mild tachycardia at approximately 105 without murmur gallop or rub Abdomen: Nontender, nondistended, soft, bowel sounds positive, no rebound, no ascites, no appreciable mass Extremities: Trace bilateral lower extremity edema without cyanosis or clubbing  CBC:  Recent Labs Lab 01/13/17 2010 01/14/17 1019 01/15/17 0440 01/16/17 0230 01/17/17 0416 01/18/17 0320 01/19/17 0233  WBC 13.9* 10.1 10.6* 10.7* 10.7* 10.0 9.0  NEUTROABS 10.2* 6.8  --   --   --   --   --   HGB 14.7 12.2 12.0 11.7* 11.3* 12.1 13.0    HCT 46.0 38.7 38.7 37.8 35.3* 38.1 40.3  MCV 102.0* 102.4* 104.6* 101.6* 100.9* 100.3* 100.0  PLT 216 176 158 184 205 228 [id number]   Basic Metabolic Panel:  Recent Labs Lab 01/14/17 1019 01/15/17 0440 01/15/17 0807 01/16/17 0230 01/17/17 0416 01/18/17 0320 01/19/17 0233  NA 140 138  --  140 138 138 139  K 4.2 5.3* 3.5 3.7 3.6 3.3* 3.7  CL 109 112*  --  108 101 101 99*  CO2 20* 20*  --  23 22 26 27   GLUCOSE 237* 180*  --  189* 134* 133* 144*  BUN 24* 22*  --  17 18 20  24*  CREATININE 1.22* 1.29*  --  1.11* 1.16* 1.26* 1.48*  CALCIUM 7.9* 8.3*  --  8.6* 8.4* 8.7* 9.2  MG 1.4* 2.2  --   --  1.2*  --  1.5*   GFR: Estimated Creatinine Clearance: 33.5 mL/min (by C-G formula based on SCr of 1.48 mg/dL (H)).  Liver Function Tests:  Recent Labs Lab 01/13/17 2010 01/17/17 0416 01/19/17 0233  AST 63* 17 21  ALT 48 18 19  ALKPHOS 117 94 104  BILITOT 0.4 0.6 0.9  PROT 6.8 5.6* 6.2*  ALBUMIN 3.7 2.8* 3.3*   Cardiac Enzymes:  Recent Labs Lab 01/13/17 2010 01/14/17 0143 01/14/17 0713 01/14/17 1019  TROPONINI <0.03 <0.03 <0.03 <0.03    HbA1C: Hgb A1c MFr Bld  Date/Time Value Ref Range Status  01/14/2017 01:43 AM 8.3 (H) 4.8 - 5.6 % Final    Comment:    (NOTE)         Pre-diabetes: 5.7 - 6.4         Diabetes: >6.4         Glycemic control for adults with diabetes: <7.0     CBG:  Recent Labs Lab 01/18/17 0909 01/18/17 1219 01/18/17 1642 01/18/17 2130 01/19/17 0659  GLUCAP 133* 183* 164* 153* 190*    Recent Results (from the past 240 hour(s))  Blood Culture (routine x 2)     Status: None   Collection Time: 01/13/17  8:10 PM  Result Value Ref Range Status   Specimen Description BLOOD RIGHT ANTECUBITAL  Final   Special Requests BOTTLES DRAWN AEROBIC AND ANAEROBIC 5CC EA  Final   Culture NO GROWTH 5 DAYS  Final   Report Status 01/18/2017 FINAL  Final  Blood Culture (routine x 2)     Status: None   Collection Time: 01/13/17  8:17 PM  Result Value Ref Range  Status   Specimen Description BLOOD LEFT ARM  Final   Special Requests IN PEDIATRIC BOTTLE 4CC  Final   Culture NO GROWTH 5 DAYS  Final   Report Status 01/18/2017 FINAL  Final  Urine culture     Status: None   Collection Time: 01/13/17  9:31 PM  Result Value Ref Range  Status   Specimen Description URINE, CATHETERIZED  Final   Special Requests NONE  Final   Culture NO GROWTH  Final   Report Status 01/15/2017 FINAL  Final  MRSA PCR Screening     Status: None   Collection Time: 01/14/17  3:39 AM  Result Value Ref Range Status   MRSA by PCR NEGATIVE NEGATIVE Final    Comment:        The GeneXpert MRSA Assay (FDA approved for NASAL specimens only), is one component of a comprehensive MRSA colonization surveillance program. It is not intended to diagnose MRSA infection nor to guide or monitor treatment for MRSA infections.      Scheduled Meds: . amiodarone  200 mg Oral BID  . aspirin  81 mg Oral Daily  . atorvastatin  20 mg Oral q1800  . ezetimibe  10 mg Oral q1800  . furosemide  40 mg Intravenous BID  . insulin aspart  0-15 Units Subcutaneous TID WC  . insulin aspart  0-5 Units Subcutaneous QHS  . metoprolol tartrate  25 mg Oral BID  . nortriptyline  25 mg Oral QHS  . rivaroxaban  15 mg Oral BID WC   Followed by  . [START ON 02/08/2017] rivaroxaban  20 mg Oral Q supper   Continuous Infusions: . sodium chloride 10 mL/hr at 01/17/17 1615  . amiodarone 60 mg/hr (01/19/17 [id number])   Followed by  . amiodarone       LOS: 6 days   [name], MD [facility] Office  [phone] Pager - Text Page per Shea Evans as per below:  On-Call/Text Page:      [email or web address]      password [email or web address]  If 7PM-7AM, please contact night-coverage [email or web address] Password [email or web address] 01/19/2017, 7:49 AM

## 2017-01-19 NOTE — Progress Notes (Signed)
Progress Note  Patient Name: Catherine Gonzales Date of Encounter: 01/19/2017  Primary Cardiologist: Dr. Stanford Breed  Subjective   Patient feels well, denies chest pain, palpitations, dizziness, nausea, and trouble breathing. She wants to go home this weekend.  Inpatient Medications    Scheduled Meds: . amiodarone  200 mg Oral BID  . aspirin  81 mg Oral Daily  . atorvastatin  20 mg Oral q1800  . ezetimibe  10 mg Oral q1800  . furosemide  40 mg Intravenous BID  . insulin aspart  0-15 Units Subcutaneous TID WC  . insulin aspart  0-5 Units Subcutaneous QHS  . metoprolol tartrate  25 mg Oral BID  . nortriptyline  25 mg Oral QHS  . rivaroxaban  15 mg Oral BID WC   Followed by  . [START ON 02/08/2017] rivaroxaban  20 mg Oral Q supper   Continuous Infusions: . sodium chloride 10 mL/hr at 01/17/17 1615  . amiodarone 60 mg/hr (01/19/17 0635)   Followed by  . amiodarone     PRN Meds: acetaminophen **OR** acetaminophen, bisacodyl, HYDROcodone-acetaminophen, ondansetron **OR** ondansetron (ZOFRAN) IV, polyethylene glycol   Vital Signs    Vitals:   01/18/17 1507 01/18/17 2125 01/19/17 0620 01/19/17 0701  BP: 124/71 (!) 180/75 (!) 133/93 133/76  Pulse: 79 84 (!) 128 (!) 109  Resp: 20 18 18    Temp:  97.9 F (36.6 C) 97.9 F (36.6 C)   TempSrc:   Oral   SpO2: 97% 95% 95% 95%  Weight:   208 lb 5.4 oz (94.5 kg)   Height:        Intake/Output Summary (Last 24 hours) at 01/19/17 0729 Last data filed at 01/19/17 [id number]  Gross per 24 hour  Intake              720 ml  Output             3500 ml  Net            -2780 ml   Filed Weights   01/16/17 0500 01/18/17 0500 01/19/17 0620  Weight: 225 lb 12 oz (102.4 kg) 219 lb 2.2 oz (99.4 kg) 208 lb 5.4 oz (94.5 kg)    Telemetry    NSR converted to atrial fibrillation/flutter in the 120s approximately 0500 this morning. Remains in atrial flutter in the 100s. - Personally Reviewed  ECG    EKG 01/18/17: atrial flutter in 120s - plan to  review EKGs with attending   Physical Exam   General: Well developed, well nourished, female appearing in no acute distress. Head: Normocephalic, atraumatic.  Neck: Supple without bruits, mild JVD Lungs:  Resp regular and unlabored, diminished throughout. Heart: Irregular rate and rhythm, S2, no murmur Abdomen: Soft, non-tender, non-distended with normoactive bowel sounds. No hepatomegaly. No rebound/guarding. No obvious abdominal masses. Extremities: No clubbing, cyanosis, Trace edema. Distal pedal pulses are 1+ bilaterally. Neuro: Alert and oriented X 3. Moves all extremities spontaneously. Psych: Normal affect.  Labs    Chemistry Recent Labs Lab 01/13/17 2010  01/17/17 0416 01/18/17 0320 01/19/17 0233  NA 140  < > 138 138 139  K 4.6  < > 3.6 3.3* 3.7  CL 104  < > 101 101 99*  CO2 22  < > 22 26 27   GLUCOSE 235*  < > 134* 133* 144*  BUN 29*  < > 18 20 24*  CREATININE 1.39*  < > 1.16* 1.26* 1.48*  CALCIUM 9.2  < > 8.4* 8.7* 9.2  PROT  6.8  --  5.6*  --  6.2*  ALBUMIN 3.7  --  2.8*  --  3.3*  AST 63*  --  17  --  21  ALT 48  --  18  --  19  ALKPHOS 117  --  94  --  104  BILITOT 0.4  --  0.6  --  0.9  GFRNONAA 35*  < > 43* 39* 32*  GFRAA 40*  < > 50* 45* 37*  ANIONGAP 14  < > 15 11 13   < > = values in this interval not displayed.   Hematology Recent Labs Lab 01/17/17 0416 01/18/17 0320 01/19/17 0233  WBC 10.7* 10.0 9.0  RBC 3.50* 3.80* 4.03  HGB 11.3* 12.1 13.0  HCT 35.3* 38.1 40.3  MCV 100.9* 100.3* 100.0  MCH 32.3 31.8 32.3  MCHC 32.0 31.8 32.3  RDW 15.5 15.4 15.0  PLT 205 228 256    Cardiac Enzymes Recent Labs Lab 01/13/17 2010 01/14/17 0143 01/14/17 0713 01/14/17 1019  TROPONINI <0.03 <0.03 <0.03 <0.03    Recent Labs Lab 01/13/17 2023  TROPIPOC 0.00     BNP Recent Labs Lab 01/13/17 2023  BNP 287.2*     DDimer No results for input(s): DDIMER in the last 168 hours.   Radiology    No results found.  Cardiac Studies    Echocardiogram 01/14/17: Study Conclusions - Left ventricle: The cavity size was normal. Wall thickness was increased in a pattern of mild LVH. Systolic function was normal. The estimated ejection fraction was in the range of 50% to 55%. Wall motion was normal; there were no regional wall motion abnormalities. - Ventricular septum: The contour showed diastolic flattening and systolic flattening. - Mitral valve: Calcified annulus. - Right ventricle: Systolic function was moderately reduced. - Atrial septum: The septum bowed from right to left, consistent with increased right atrial pressure. - Tricuspid valve: There was moderate regurgitation. - Pulmonary arteries: Systolic pressure was moderately increased. PA peak pressure: 51 mm Hg (S). Impressions: - Technically difficult; definity used; normal LV systolic function; D shaped septum; moderately reduced RV function; moderate TR; moderately elevated pulmonary pressure.  Patient Profile     83 y.o. female with past medical history of HTN, Type 2 DM, CKD, and dementia who presented to [facility] ED on 01/13/2017 for worsening dyspnea  Assessment & Plan    1.  atrial flutter-atrial arrhythmia  - likely related to the stress of her recent pulmonary embolus with increased pulmonary pressures and right atrial/right ventricular strain. Her blood pressure is borderline.  - she converted back to atrial fibrillation/flutter at approximately 0500 this morning - pt currently receiving amiodarone bolus + gtt - Continue lopressor 25 mg BID  - continue xarelto - TSH is normal. Echocardiogram without LV dysfunction.  - CHADSvasc = 6 (female, age x 2, CHF, HTN, DM). She will probably require long-term anticoagulation as + arrhythmia, DVT & pulmonary embolus. There are no clear precipitating factors. - continue telemetry, monitor rhythm with IV amiodarone  2. pulmonary embolus - started on Xarelto  3. Hypertension -  controlled now  4. Acute on Chronic diastolic CHF: - diuresed with 3.5L output yesterday, weight down 208 lb (219 lb) with LE edema greatly improved - Crea trending up 1.48 (1.26) - lasix still 40 mg IV BID - plan to decrease lasix to 20 mg BID  Florene Route , PA-C 7:29 AM 01/19/2017 Pager: [phone]  The patient was seen, examined and discussed with Levada Dy  N Duke , PA-C and I agree with the above.   83 year old female admitted with an acute pulmonary embolism and paroxysmal a-fib and atrial flutter, cardioverted spontaneously on iv amiodarone, then another episode of atrial flutter this am and cardioverted to SR at noon. She appears euvolemic and was able to walk with her nurse with some SOB. We will switch lasix 40 mg iv BID to 40 mg po BID, decrease before discharge, she was on no diuretics prior to discharge. Continue amiodarone 200 mg po BID.  BP well controlled.  Ena Dawley, MD 01/19/2017

## 2017-01-19 NOTE — Progress Notes (Signed)
PT Cancellation Note  Patient Details Name: Catherine Gonzales MRN: [id number] DOB: [birth year]   Cancelled Treatment:    Reason Eval/Treat Not Completed: Other (comment) (pt bathing with student nurses. will attempt to return)   Domonic Kimball B Brooklen Runquist 01/19/2017, 10:12 AM Elwyn Reach, [place]

## 2017-01-19 NOTE — Progress Notes (Signed)
Call received from [facility] saying that this patient went into afib 120's-130's. Patient switches from afib to sinus tach. EKGs done. Paged on call cardiology. Waiting on return call. Patient asymptomatic. Will continue to monitor.

## 2017-01-20 DIAGNOSIS — R413 Other amnesia: Secondary | ICD-10-CM

## 2017-01-20 DIAGNOSIS — I10 Essential (primary) hypertension: Secondary | ICD-10-CM

## 2017-01-20 DIAGNOSIS — N289 Disorder of kidney and ureter, unspecified: Secondary | ICD-10-CM

## 2017-01-20 LAB — RENAL FUNCTION PANEL
ALBUMIN: 3.4 g/dL — AB (ref 3.5–5.0)
ANION GAP: 12 (ref 5–15)
BUN: 25 mg/dL — AB (ref 6–20)
CALCIUM: 9.2 mg/dL (ref 8.9–10.3)
CO2: 27 mmol/L (ref 22–32)
Chloride: 98 mmol/L — ABNORMAL LOW (ref 101–111)
Creatinine, Ser: 1.51 mg/dL — ABNORMAL HIGH (ref 0.44–1.00)
GFR calc Af Amer: 36 mL/min — ABNORMAL LOW (ref >60)
GFR, EST NON AFRICAN AMERICAN: 31 mL/min — AB (ref >60)
GLUCOSE: 164 mg/dL — AB (ref 65–99)
PHOSPHORUS: 4.6 mg/dL (ref 2.5–4.6)
POTASSIUM: 3.8 mmol/L (ref 3.5–5.1)
SODIUM: 137 mmol/L (ref 135–145)

## 2017-01-20 LAB — CBC
HEMATOCRIT: 42 % (ref 36.0–46.0)
Hemoglobin: 13.4 g/dL (ref 12.0–15.0)
MCH: 31.9 pg (ref 26.0–34.0)
MCHC: 31.9 g/dL (ref 30.0–36.0)
MCV: 100 fL (ref 78.0–100.0)
PLATELETS: 298 10*3/uL (ref 150–400)
RBC: 4.2 MIL/uL (ref 3.87–5.11)
RDW: 15.1 % (ref 11.5–15.5)
WBC: 11.9 10*3/uL — AB (ref 4.0–10.5)

## 2017-01-20 LAB — GLUCOSE, CAPILLARY
GLUCOSE-CAPILLARY: 151 mg/dL — AB (ref 65–99)
Glucose-Capillary: 188 mg/dL — ABNORMAL HIGH (ref 65–99)

## 2017-01-20 LAB — MAGNESIUM: Magnesium: 1.8 mg/dL (ref 1.7–2.4)

## 2017-01-20 MED ORDER — AMIODARONE HCL 200 MG PO TABS: 200.0000 mg | ORAL_TABLET | Freq: Two times a day (BID) | ORAL | 0 refills | Status: DC

## 2017-01-20 MED ORDER — FUROSEMIDE 40 MG PO TABS: 40.0000 mg | ORAL_TABLET | Freq: Every day | ORAL | 0 refills | Status: DC

## 2017-01-20 MED ORDER — POTASSIUM CHLORIDE CRYS ER 20 MEQ PO TBCR: 20.0000 meq | EXTENDED_RELEASE_TABLET | Freq: Every day | ORAL | 0 refills | Status: DC

## 2017-01-20 MED ORDER — ATORVASTATIN CALCIUM 20 MG PO TABS: 20.0000 mg | ORAL_TABLET | Freq: Every day | ORAL | 0 refills | Status: DC

## 2017-01-20 MED ORDER — FUROSEMIDE 40 MG PO TABS: 40.0000 mg | ORAL_TABLET | Freq: Every day | ORAL | Status: DC

## 2017-01-20 MED ORDER — RIVAROXABAN 20 MG PO TABS: 20.0000 mg | ORAL_TABLET | Freq: Every day | ORAL | 0 refills | Status: DC

## 2017-01-20 MED ORDER — RIVAROXABAN 15 MG PO TABS: 15.0000 mg | ORAL_TABLET | Freq: Two times a day (BID) | ORAL | 0 refills | Status: DC

## 2017-01-20 MED ORDER — METOPROLOL SUCCINATE ER 50 MG PO TB24: 50.0000 mg | ORAL_TABLET | Freq: Every day | ORAL | 0 refills | Status: DC

## 2017-01-20 MED ORDER — EZETIMIBE 10 MG PO TABS: 10.0000 mg | ORAL_TABLET | Freq: Every day | ORAL | 0 refills | Status: DC

## 2017-01-20 NOTE — Discharge Summary (Addendum)
DISCHARGE SUMMARY  Catherine Gonzales  MR#: [id number]  DOB:12-25-1933  Date of Admission: 01/13/2017 Date of Discharge: 01/20/2017  Attending Physician:MCCLUNG,JEFFREY T  Patient's [id number], MD  Consults: [facility]   Disposition: D/C home   Follow-up Appts: Follow-up Information    Kirk Ruths, MD Follow up in 2 week(s).   Specialty:  Cardiology Contact information: [address] [place] [address] [place], [place] Follow up in 5 day(s).   Specialty:  Family Medicine Contact information: [address] [place] [address] [phone]           Tests Needing Follow-up: -routine monitoring w/ new start of DOAC -assessment of volume status w/ possible need to adjust dose of diuretic  -adjustment of amio dose will likely be required  -check lytes, weight, and volume status on diuretic tx   Discharge Diagnoses: SIRS due to subacute submassive bilateral multilobar pulmonary emboli w/ right ventricular strain acute hypoxic respiratory failure bilateral peroneal DVT new onset atrial flutter  Acute on chronic diastolic CHF near syncope chronic kidney disease stage 3 w/o acute exacerbation  hypertension Hypomagnesemia hyperlipidemia DM2 dementia with behavioral disturbance  Initial presentation: 83 year old female w/ a history of hypertension, type 2 diabetes, dementia, chronic kidney disease with unknown baseline who presented to the ED with worsening shortness of breath, diaphoresis and a near syncopal episode. Her ED w/u revealed bilateral submassive pulmonary emboli with right ventricular strain and also atrial flutter with soft blood pressures.  Hospital Course:  subacute submassive bilateral multilobar pulmonary emboli w/ right ventricular strain / acute hypoxic respiratory failure / bilateral peroneal DVT denies recent surgeries or long car rides or family history of DVT - no current  history of neoplasm - transitioned to Xarelto without difficulty - counseled on dangers of using a "blood thinner" and need to seek immediate attention if she suffers blunt force injury, head injury, or sx of blood loss - given her age and mild/moderate cognitive impairment, I feel her risk of injury warrants stopping of ASA while on Xarelto  new onset atrial flutter > NSR CHA2DS2 - VASc is 6 - likely secondary to PE - TSH 2.006 - Cards directed medical care for this diagnosis - newly on amio and BB - initial episode quickly converted to NSR after amio started - suffered a repeat episode of RVR 01/19/17 with transition back to IV amiodarone temporarily and subsequent conversion back to NSR - is in NSR at time of d/c   Acute on chronic diastolic CHF LE edema much improved - Cardiology directed diuresis - net negative ~4.5L for hospitalization - renal function being monitored w/ mild rise in creatinine - was not on diuretic prior to admit - max dose during hospitalization was 40mg  IV BID - change to 40mg  po QD at time of d/c - will need f/u of weight, volume status, and lytes as outpt - not resuming ACE for now due to kidney disease  Filed Weights   01/18/17 0500 01/19/17 0620 01/20/17 0626  Weight: 99.4 kg (219 lb 2.2 oz) 94.5 kg (208 lb 5.4 oz) 93.4 kg (205 lb 14.4 oz)    near syncope secondary to acute bilateral PE - TTE w/ EF of 50-55%, no wall motion abnormalities - no recurrence during hospitalization   chronic kidney disease Unknown baseline - crt increased slightly w/ diuresis but appeared stable at time of d/c - will need f/u check of crt as outpt - ACEi stopped  for now   Recent Labs Lab 01/16/17 0230 01/17/17 0416 01/18/17 0320 01/19/17 0233 01/20/17 0331  CREATININE 1.11* 1.16* 1.26* 1.48* 1.51*    hypertension BP controlled at time of d/c   Hypomagnesemia replaced to 1.8 at time of d/c   hyperlipidemia simavastatin changed to atorvastatin with use of amiodarone  (increaed risk of rhabdo w/ simvastatin + amio)  DM2 Hemoglobin A1c8.3 - CBG reasonably controlled during hospital stay - resume usual home regimen at time of d/c home   dementia with behavioral disturbance Stable - continue risperdal daily at bedtime  Allergies as of 01/20/2017   No Known Allergies     Medication List    STOP taking these medications   aspirin 81 MG chewable tablet   ezetimibe-simvastatin 10-40 MG tablet Commonly known as:  VYTORIN   lisinopril 40 MG tablet Commonly known as:  PRINIVIL,ZESTRIL     TAKE these medications   amiodarone 200 MG tablet Commonly known as:  PACERONE Take 1 tablet (200 mg total) by mouth 2 (two) times daily.   atorvastatin 20 MG tablet Commonly known as:  LIPITOR Take 1 tablet (20 mg total) by mouth daily at 6 PM.   ezetimibe 10 MG tablet Commonly known as:  ZETIA Take 1 tablet (10 mg total) by mouth daily at 6 PM.   furosemide 40 MG tablet Commonly known as:  LASIX Take 1 tablet (40 mg total) by mouth daily. Start taking on:  01/21/2017   JANUMET XR [phone] MG Tb24 Generic drug:  SitaGLIPtin-MetFORMIN HCl Take 1 tablet by mouth daily.   metoprolol succinate 50 MG 24 hr tablet Commonly known as:  TOPROL-XL Take 1 tablet (50 mg total) by mouth daily. Take with or immediately following a meal. What changed:  medication strength  how much to take   nortriptyline 25 MG capsule Commonly known as:  PAMELOR Take 25 mg by mouth at bedtime.   potassium chloride SA 20 MEQ tablet Commonly known as:  K-DUR,KLOR-CON Take 1 tablet (20 mEq total) by mouth daily.   risperiDONE 0.25 MG tablet Commonly known as:  RISPERDAL Take 0.25 mg by mouth at bedtime.   Rivaroxaban 15 MG Tabs tablet Commonly known as:  XARELTO Take 1 tablet (15 mg total) by mouth 2 (two) times daily with a meal.   rivaroxaban 20 MG Tabs tablet Commonly known as:  XARELTO Take 1 tablet (20 mg total) by mouth daily with supper. Start taking on:   02/08/2017       Day of Discharge BP 128/73 (BP Location: Left Arm)   Pulse 79   Temp 99 F (37.2 C) (Oral)   Resp 18   Ht 5' 3.5" (1.613 m)   Wt 93.4 kg (205 lb 14.4 oz)   SpO2 92%   BMI 35.90 kg/m   Physical Exam: General: No acute respiratory distress Lungs: Clear to auscultation bilaterally without wheezes or crackles Cardiovascular: Regular rate and rhythm without murmur gallop or rub  Abdomen: Nontender, nondistended, soft, bowel sounds positive, no rebound, no ascites, no appreciable mass Extremities: No significant cyanosis, clubbing, or edema bilateral lower extremities  Basic Metabolic Panel:  Recent Labs Lab 01/14/17 1019 01/15/17 0440  01/16/17 0230 01/17/17 0416 01/18/17 0320 01/19/17 0233 01/20/17 0331  NA 140 138  --  140 138 138 139 137  K 4.2 5.3*  < > 3.7 3.6 3.3* 3.7 3.8  CL 109 112*  --  108 101 101 99* 98*  CO2 20* 20*  --  23 22  26 27 27   GLUCOSE 237* 180*  --  189* 134* 133* 144* 164*  BUN 24* 22*  --  17 18 20  24* 25*  CREATININE 1.22* 1.29*  --  1.11* 1.16* 1.26* 1.48* 1.51*  CALCIUM 7.9* 8.3*  --  8.6* 8.4* 8.7* 9.2 9.2  MG 1.4* 2.2  --   --  1.2*  --  1.5* 1.8  PHOS  --   --   --   --   --   --   --  4.6  < > = values in this interval not displayed.  Liver Function Tests:  Recent Labs Lab 01/13/17 2010 01/17/17 0416 01/19/17 0233 01/20/17 0331  AST 63* 17 21  --   ALT 48 18 19  --   ALKPHOS 117 94 104  --   BILITOT 0.4 0.6 0.9  --   PROT 6.8 5.6* 6.2*  --   ALBUMIN 3.7 2.8* 3.3* 3.4*   CBC:  Recent Labs Lab 01/13/17 2010 01/14/17 1019  01/16/17 0230 01/17/17 0416 01/18/17 0320 01/19/17 0233 01/20/17 0331  WBC 13.9* 10.1  < > 10.7* 10.7* 10.0 9.0 11.9*  NEUTROABS 10.2* 6.8  --   --   --   --   --   --   HGB 14.7 12.2  < > 11.7* 11.3* 12.1 13.0 13.4  HCT 46.0 38.7  < > 37.8 35.3* 38.1 40.3 42.0  MCV 102.0* 102.4*  < > 101.6* 100.9* 100.3* 100.0 100.0  PLT 216 176  < > 184 205 228 256 298  < > = values in this  interval not displayed.  Cardiac Enzymes:  Recent Labs Lab 01/13/17 2010 01/14/17 0143 01/14/17 0713 01/14/17 1019  TROPONINI <0.03 <0.03 <0.03 <0.03   BNP (last 3 results)  Recent Labs  01/13/17 2023  BNP 287.2*    CBG:  Recent Labs Lab 01/19/17 0659 01/19/17 1144 01/19/17 1649 01/19/17 2038 01/20/17 0510  GLUCAP 190* 187* 155* 139* 151*    Recent Results (from the past 240 hour(s))  Blood Culture (routine x 2)     Status: None   Collection Time: 01/13/17  8:10 PM  Result Value Ref Range Status   Specimen Description BLOOD RIGHT ANTECUBITAL  Final   Special Requests BOTTLES DRAWN AEROBIC AND ANAEROBIC 5CC EA  Final   Culture NO GROWTH 5 DAYS  Final   Report Status 01/18/2017 FINAL  Final  Blood Culture (routine x 2)     Status: None   Collection Time: 01/13/17  8:17 PM  Result Value Ref Range Status   Specimen Description BLOOD LEFT ARM  Final   Special Requests IN PEDIATRIC BOTTLE 4CC  Final   Culture NO GROWTH 5 DAYS  Final   Report Status 01/18/2017 FINAL  Final  Urine culture     Status: None   Collection Time: 01/13/17  9:31 PM  Result Value Ref Range Status   Specimen Description URINE, CATHETERIZED  Final   Special Requests NONE  Final   Culture NO GROWTH  Final   Report Status 01/15/2017 FINAL  Final  MRSA PCR Screening     Status: None   Collection Time: 01/14/17  3:39 AM  Result Value Ref Range Status   MRSA by PCR NEGATIVE NEGATIVE Final    Comment:        The GeneXpert MRSA Assay (FDA approved for NASAL specimens only), is one component of a comprehensive MRSA colonization surveillance program. It is not intended to diagnose MRSA infection  nor to guide or monitor treatment for MRSA infections.      Time spent in discharge (includes decision making & examination of pt): 35 minutes  01/20/2017, 8:53 AM   [name], MD [facility] Office  [phone] Pager [phone]  On-Call/Text Page:      [email or web address]       password [email or web address]

## 2017-01-23 ENCOUNTER — Telehealth: Payer: Self-pay

## 2017-01-23 NOTE — Telephone Encounter (Signed)
-----   Message from Lonn Georgia, PA-C sent at 01/23/2017  1:51 PM EST ----- Patient discharge 2/03 with CHF and atrial fib/flutter. Patient of Dr. Stanford Breed. Please arrange to see him follow-up and call her. Thank you

## 2017-01-23 NOTE — Telephone Encounter (Signed)
Left message to schedule TCM appt

## 2017-01-24 DIAGNOSIS — I4892 Unspecified atrial flutter: Secondary | ICD-10-CM | POA: Diagnosis not present

## 2017-01-24 DIAGNOSIS — F0390 Unspecified dementia without behavioral disturbance: Secondary | ICD-10-CM | POA: Diagnosis not present

## 2017-01-24 DIAGNOSIS — I2699 Other pulmonary embolism without acute cor pulmonale: Secondary | ICD-10-CM | POA: Diagnosis not present

## 2017-01-24 DIAGNOSIS — Z7984 Long term (current) use of oral hypoglycemic drugs: Secondary | ICD-10-CM | POA: Diagnosis not present

## 2017-01-24 DIAGNOSIS — E1122 Type 2 diabetes mellitus with diabetic chronic kidney disease: Secondary | ICD-10-CM | POA: Diagnosis not present

## 2017-01-24 DIAGNOSIS — I130 Hypertensive heart and chronic kidney disease with heart failure and stage 1 through stage 4 chronic kidney disease, or unspecified chronic kidney disease: Secondary | ICD-10-CM | POA: Diagnosis not present

## 2017-01-24 DIAGNOSIS — Z9981 Dependence on supplemental oxygen: Secondary | ICD-10-CM | POA: Diagnosis not present

## 2017-01-24 DIAGNOSIS — I82493 Acute embolism and thrombosis of other specified deep vein of lower extremity, bilateral: Secondary | ICD-10-CM | POA: Diagnosis not present

## 2017-01-24 DIAGNOSIS — N189 Chronic kidney disease, unspecified: Secondary | ICD-10-CM | POA: Diagnosis not present

## 2017-01-24 DIAGNOSIS — I5033 Acute on chronic diastolic (congestive) heart failure: Secondary | ICD-10-CM | POA: Diagnosis not present

## 2017-01-26 DIAGNOSIS — E538 Deficiency of other specified B group vitamins: Secondary | ICD-10-CM | POA: Diagnosis not present

## 2017-01-26 DIAGNOSIS — I519 Heart disease, unspecified: Secondary | ICD-10-CM | POA: Diagnosis not present

## 2017-01-26 DIAGNOSIS — I82403 Acute embolism and thrombosis of unspecified deep veins of lower extremity, bilateral: Secondary | ICD-10-CM | POA: Diagnosis not present

## 2017-01-26 DIAGNOSIS — I4892 Unspecified atrial flutter: Secondary | ICD-10-CM | POA: Diagnosis not present

## 2017-01-26 DIAGNOSIS — N179 Acute kidney failure, unspecified: Secondary | ICD-10-CM | POA: Diagnosis not present

## 2017-01-26 DIAGNOSIS — I2699 Other pulmonary embolism without acute cor pulmonale: Secondary | ICD-10-CM | POA: Diagnosis not present

## 2017-01-26 DIAGNOSIS — J9691 Respiratory failure, unspecified with hypoxia: Secondary | ICD-10-CM | POA: Diagnosis not present

## 2017-01-26 DIAGNOSIS — R413 Other amnesia: Secondary | ICD-10-CM | POA: Diagnosis not present

## 2017-01-26 DIAGNOSIS — Z79899 Other long term (current) drug therapy: Secondary | ICD-10-CM | POA: Diagnosis not present

## 2017-01-27 DIAGNOSIS — I2699 Other pulmonary embolism without acute cor pulmonale: Secondary | ICD-10-CM | POA: Diagnosis not present

## 2017-01-27 DIAGNOSIS — I5033 Acute on chronic diastolic (congestive) heart failure: Secondary | ICD-10-CM | POA: Diagnosis not present

## 2017-01-27 DIAGNOSIS — E1122 Type 2 diabetes mellitus with diabetic chronic kidney disease: Secondary | ICD-10-CM | POA: Diagnosis not present

## 2017-01-27 DIAGNOSIS — I130 Hypertensive heart and chronic kidney disease with heart failure and stage 1 through stage 4 chronic kidney disease, or unspecified chronic kidney disease: Secondary | ICD-10-CM | POA: Diagnosis not present

## 2017-01-27 DIAGNOSIS — N189 Chronic kidney disease, unspecified: Secondary | ICD-10-CM | POA: Diagnosis not present

## 2017-01-27 DIAGNOSIS — I82493 Acute embolism and thrombosis of other specified deep vein of lower extremity, bilateral: Secondary | ICD-10-CM | POA: Diagnosis not present

## 2017-01-29 NOTE — Telephone Encounter (Signed)
APPT MADE

## 2017-01-30 DIAGNOSIS — I82493 Acute embolism and thrombosis of other specified deep vein of lower extremity, bilateral: Secondary | ICD-10-CM | POA: Diagnosis not present

## 2017-01-30 DIAGNOSIS — I130 Hypertensive heart and chronic kidney disease with heart failure and stage 1 through stage 4 chronic kidney disease, or unspecified chronic kidney disease: Secondary | ICD-10-CM | POA: Diagnosis not present

## 2017-01-30 DIAGNOSIS — I2699 Other pulmonary embolism without acute cor pulmonale: Secondary | ICD-10-CM | POA: Diagnosis not present

## 2017-01-30 DIAGNOSIS — I5033 Acute on chronic diastolic (congestive) heart failure: Secondary | ICD-10-CM | POA: Diagnosis not present

## 2017-01-30 DIAGNOSIS — E1122 Type 2 diabetes mellitus with diabetic chronic kidney disease: Secondary | ICD-10-CM | POA: Diagnosis not present

## 2017-01-30 DIAGNOSIS — N189 Chronic kidney disease, unspecified: Secondary | ICD-10-CM | POA: Diagnosis not present

## 2017-01-31 NOTE — Progress Notes (Signed)
HPI: Follow-up atrial flutter. Patient admitted January 2018 with acute pulmonary embolus and also noted to have atrial flutter. Lower extremity Dopplers also showed DVT. Echocardiogram January 2018 showed normal LV function, decreased RV function, moderate tricuspid regurgitation and moderately elevated pulmonary pressure. Since she was discharged, she denies dyspnea, chest pain, palpitations or syncope.  Current Outpatient Prescriptions  Medication Sig Dispense Refill  . amiodarone (PACERONE) 200 MG tablet Take 1 tablet (200 mg total) by mouth 2 (two) times daily. 60 tablet 0  . atorvastatin (LIPITOR) 20 MG tablet Take 1 tablet (20 mg total) by mouth daily at 6 PM. 30 tablet 0  . ezetimibe (ZETIA) 10 MG tablet Take 1 tablet (10 mg total) by mouth daily at 6 PM. 30 tablet 0  . metoprolol succinate (TOPROL-XL) 50 MG 24 hr tablet Take 1 tablet (50 mg total) by mouth daily. Take with or immediately following a meal. 30 tablet 0  . rivaroxaban (XARELTO) 20 MG TABS tablet Take 1 tablet (20 mg total) by mouth daily with supper. 30 tablet 0  . SitaGLIPtin-MetFORMIN HCl (JANUMET XR) [phone] MG TB24 Take 1 tablet by mouth daily.    . Rivaroxaban (XARELTO) 15 MG TABS tablet Take 1 tablet (15 mg total) by mouth 2 (two) times daily with a meal. 38 tablet 0   No current facility-administered medications for this visit.     No Known Allergies   Past Medical History:  Diagnosis Date  . Chronic kidney disease   . Dementia   . Diabetes mellitus without complication ([place])   . Hypertension     Past Surgical History:  Procedure Laterality Date  . CHOLECYSTECTOMY      Social History   Social History  . Marital status: Married    Spouse name: N/A  . Number of children: N/A  . Years of education: N/A   Occupational History  . Not on file.   Social History Main Topics  . Smoking status: Never Smoker  . Smokeless tobacco: Never Used  . Alcohol use No  . Drug use: No  . Sexual  activity: Not on file   Other Topics Concern  . Not on file   Social History Narrative  . No narrative on file    Family History  Problem Relation Age of Onset  . Deep vein thrombosis Neg Hx     ROS: no fevers or chills, productive cough, hemoptysis, dysphasia, odynophagia, melena, hematochezia, dysuria, hematuria, rash, seizure activity, orthopnea, PND, pedal edema, claudication. Remaining systems are negative.  Physical Exam:   Blood pressure 136/78, pulse (!) 59, height 5\' 4"  (1.626 m), weight 203 lb (92.1 kg).  General:  Well developed/obese in NAD Skin warm/dry Patient not depressed No peripheral clubbing Back-normal HEENT-normal/normal eyelids Neck supple/normal carotid upstroke bilaterally; no bruits; no JVD; no thyromegaly chest - CTA/ normal expansion CV - RRR/normal S1 and S2; no murmurs, rubs or gallops;  PMI nondisplaced Abdomen -NT/ND, no HSM, no mass, + bowel sounds, no bruit 2+ femoral pulses, no bruits Ext-no edema, chords, 2+ DP Neuro-grossly nonfocal  ECG - Sinus bradycardia, nonspecific ST changes  A/P  1 status post pulmonary embolus-she is markedly improved. She had no precipitating factors for her event. I would favor long-term anticoagulation particularly given that she also had atrial arrhythmias. Continue xarelto. We will have her most recent hemoglobin, BUN and creatinine forwarded to [place] from primary care.  2 atrial fibrillation/flutter-patient in sinus rhythm today. I think her recent event caused her atrial  arrhythmias because of increased right heart strain. Decrease amiodarone to 200 mg daily. I will see her back in 8-12 weeks. If she holds sinus rhythm I would favor discontinuing amiodarone to see if she will holds sinus on her own long-term. Continue xarelto.  3 hypertension-blood pressure controlled. Continue present medications.  4 Acute diastolic congestive heart failure-patient had CHF during hospitalization. She was placed on diuretic  but this has now been discontinued and she is euvolemic.   Kirk Ruths, MD

## 2017-02-02 DIAGNOSIS — N189 Chronic kidney disease, unspecified: Secondary | ICD-10-CM | POA: Diagnosis not present

## 2017-02-02 DIAGNOSIS — I5033 Acute on chronic diastolic (congestive) heart failure: Secondary | ICD-10-CM | POA: Diagnosis not present

## 2017-02-02 DIAGNOSIS — E1122 Type 2 diabetes mellitus with diabetic chronic kidney disease: Secondary | ICD-10-CM | POA: Diagnosis not present

## 2017-02-02 DIAGNOSIS — I130 Hypertensive heart and chronic kidney disease with heart failure and stage 1 through stage 4 chronic kidney disease, or unspecified chronic kidney disease: Secondary | ICD-10-CM | POA: Diagnosis not present

## 2017-02-02 DIAGNOSIS — I82493 Acute embolism and thrombosis of other specified deep vein of lower extremity, bilateral: Secondary | ICD-10-CM | POA: Diagnosis not present

## 2017-02-02 DIAGNOSIS — I2699 Other pulmonary embolism without acute cor pulmonale: Secondary | ICD-10-CM | POA: Diagnosis not present

## 2017-02-05 DIAGNOSIS — N179 Acute kidney failure, unspecified: Secondary | ICD-10-CM | POA: Diagnosis not present

## 2017-02-08 DIAGNOSIS — N189 Chronic kidney disease, unspecified: Secondary | ICD-10-CM | POA: Diagnosis not present

## 2017-02-08 DIAGNOSIS — E1122 Type 2 diabetes mellitus with diabetic chronic kidney disease: Secondary | ICD-10-CM | POA: Diagnosis not present

## 2017-02-08 DIAGNOSIS — I130 Hypertensive heart and chronic kidney disease with heart failure and stage 1 through stage 4 chronic kidney disease, or unspecified chronic kidney disease: Secondary | ICD-10-CM | POA: Diagnosis not present

## 2017-02-08 DIAGNOSIS — I82493 Acute embolism and thrombosis of other specified deep vein of lower extremity, bilateral: Secondary | ICD-10-CM | POA: Diagnosis not present

## 2017-02-08 DIAGNOSIS — I2699 Other pulmonary embolism without acute cor pulmonale: Secondary | ICD-10-CM | POA: Diagnosis not present

## 2017-02-08 DIAGNOSIS — I5033 Acute on chronic diastolic (congestive) heart failure: Secondary | ICD-10-CM | POA: Diagnosis not present

## 2017-02-12 ENCOUNTER — Encounter: Payer: Self-pay | Admitting: Cardiology

## 2017-02-12 ENCOUNTER — Ambulatory Visit (INDEPENDENT_AMBULATORY_CARE_PROVIDER_SITE_OTHER): Payer: Medicare Other | Admitting: Cardiology

## 2017-02-12 ENCOUNTER — Other Ambulatory Visit: Payer: Self-pay | Admitting: *Deleted

## 2017-02-12 VITALS — BP 136/78 | HR 59 | Ht 64.0 in | Wt 203.0 lb

## 2017-02-12 DIAGNOSIS — I10 Essential (primary) hypertension: Secondary | ICD-10-CM

## 2017-02-12 DIAGNOSIS — I2609 Other pulmonary embolism with acute cor pulmonale: Secondary | ICD-10-CM

## 2017-02-12 DIAGNOSIS — I480 Paroxysmal atrial fibrillation: Secondary | ICD-10-CM | POA: Diagnosis not present

## 2017-02-12 DIAGNOSIS — I4891 Unspecified atrial fibrillation: Secondary | ICD-10-CM | POA: Diagnosis not present

## 2017-02-12 MED ORDER — AMIODARONE HCL 200 MG PO TABS: 200.0000 mg | ORAL_TABLET | Freq: Every day | ORAL | 0 refills | Status: DC

## 2017-02-12 MED ORDER — RIVAROXABAN 20 MG PO TABS: 20.0000 mg | ORAL_TABLET | Freq: Every day | ORAL | 3 refills | Status: DC

## 2017-02-12 MED ORDER — AMIODARONE HCL 200 MG PO TABS: 200.0000 mg | ORAL_TABLET | Freq: Every day | ORAL | 6 refills | Status: DC

## 2017-02-12 NOTE — Patient Instructions (Signed)
Medication Instructions:   DECREASE AMIODARONE TO 200 MG ONCE DAILY  Follow-Up:  Your physician recommends that you schedule a follow-up appointment in: [place]

## 2017-02-22 DIAGNOSIS — I5033 Acute on chronic diastolic (congestive) heart failure: Secondary | ICD-10-CM | POA: Diagnosis not present

## 2017-02-22 DIAGNOSIS — I130 Hypertensive heart and chronic kidney disease with heart failure and stage 1 through stage 4 chronic kidney disease, or unspecified chronic kidney disease: Secondary | ICD-10-CM | POA: Diagnosis not present

## 2017-02-22 DIAGNOSIS — E1122 Type 2 diabetes mellitus with diabetic chronic kidney disease: Secondary | ICD-10-CM | POA: Diagnosis not present

## 2017-02-22 DIAGNOSIS — I2699 Other pulmonary embolism without acute cor pulmonale: Secondary | ICD-10-CM | POA: Diagnosis not present

## 2017-02-22 DIAGNOSIS — N189 Chronic kidney disease, unspecified: Secondary | ICD-10-CM | POA: Diagnosis not present

## 2017-02-22 DIAGNOSIS — I82493 Acute embolism and thrombosis of other specified deep vein of lower extremity, bilateral: Secondary | ICD-10-CM | POA: Diagnosis not present

## 2017-02-27 DIAGNOSIS — I130 Hypertensive heart and chronic kidney disease with heart failure and stage 1 through stage 4 chronic kidney disease, or unspecified chronic kidney disease: Secondary | ICD-10-CM | POA: Diagnosis not present

## 2017-02-27 DIAGNOSIS — I82493 Acute embolism and thrombosis of other specified deep vein of lower extremity, bilateral: Secondary | ICD-10-CM | POA: Diagnosis not present

## 2017-02-27 DIAGNOSIS — I5033 Acute on chronic diastolic (congestive) heart failure: Secondary | ICD-10-CM | POA: Diagnosis not present

## 2017-02-27 DIAGNOSIS — N189 Chronic kidney disease, unspecified: Secondary | ICD-10-CM | POA: Diagnosis not present

## 2017-02-27 DIAGNOSIS — E1122 Type 2 diabetes mellitus with diabetic chronic kidney disease: Secondary | ICD-10-CM | POA: Diagnosis not present

## 2017-02-27 DIAGNOSIS — I2699 Other pulmonary embolism without acute cor pulmonale: Secondary | ICD-10-CM | POA: Diagnosis not present

## 2017-03-01 DIAGNOSIS — I2782 Chronic pulmonary embolism: Secondary | ICD-10-CM | POA: Diagnosis not present

## 2017-03-01 DIAGNOSIS — E782 Mixed hyperlipidemia: Secondary | ICD-10-CM | POA: Diagnosis not present

## 2017-03-01 DIAGNOSIS — R8299 Other abnormal findings in urine: Secondary | ICD-10-CM | POA: Diagnosis not present

## 2017-03-01 DIAGNOSIS — Z79899 Other long term (current) drug therapy: Secondary | ICD-10-CM | POA: Diagnosis not present

## 2017-03-01 DIAGNOSIS — I129 Hypertensive chronic kidney disease with stage 1 through stage 4 chronic kidney disease, or unspecified chronic kidney disease: Secondary | ICD-10-CM | POA: Diagnosis not present

## 2017-03-01 DIAGNOSIS — E1122 Type 2 diabetes mellitus with diabetic chronic kidney disease: Secondary | ICD-10-CM | POA: Diagnosis not present

## 2017-03-01 DIAGNOSIS — N183 Chronic kidney disease, stage 3 (moderate): Secondary | ICD-10-CM | POA: Diagnosis not present

## 2017-03-01 DIAGNOSIS — E538 Deficiency of other specified B group vitamins: Secondary | ICD-10-CM | POA: Diagnosis not present

## 2017-03-07 DIAGNOSIS — I2699 Other pulmonary embolism without acute cor pulmonale: Secondary | ICD-10-CM | POA: Diagnosis not present

## 2017-03-07 DIAGNOSIS — E1122 Type 2 diabetes mellitus with diabetic chronic kidney disease: Secondary | ICD-10-CM | POA: Diagnosis not present

## 2017-03-07 DIAGNOSIS — I82493 Acute embolism and thrombosis of other specified deep vein of lower extremity, bilateral: Secondary | ICD-10-CM | POA: Diagnosis not present

## 2017-03-07 DIAGNOSIS — N189 Chronic kidney disease, unspecified: Secondary | ICD-10-CM | POA: Diagnosis not present

## 2017-03-07 DIAGNOSIS — I130 Hypertensive heart and chronic kidney disease with heart failure and stage 1 through stage 4 chronic kidney disease, or unspecified chronic kidney disease: Secondary | ICD-10-CM | POA: Diagnosis not present

## 2017-03-07 DIAGNOSIS — I5033 Acute on chronic diastolic (congestive) heart failure: Secondary | ICD-10-CM | POA: Diagnosis not present

## 2017-03-09 DIAGNOSIS — E538 Deficiency of other specified B group vitamins: Secondary | ICD-10-CM | POA: Diagnosis not present

## 2017-03-14 ENCOUNTER — Telehealth: Payer: Self-pay | Admitting: Cardiology

## 2017-03-14 DIAGNOSIS — I82493 Acute embolism and thrombosis of other specified deep vein of lower extremity, bilateral: Secondary | ICD-10-CM | POA: Diagnosis not present

## 2017-03-14 DIAGNOSIS — I2699 Other pulmonary embolism without acute cor pulmonale: Secondary | ICD-10-CM | POA: Diagnosis not present

## 2017-03-14 DIAGNOSIS — I5033 Acute on chronic diastolic (congestive) heart failure: Secondary | ICD-10-CM | POA: Diagnosis not present

## 2017-03-14 DIAGNOSIS — E1122 Type 2 diabetes mellitus with diabetic chronic kidney disease: Secondary | ICD-10-CM | POA: Diagnosis not present

## 2017-03-14 DIAGNOSIS — I130 Hypertensive heart and chronic kidney disease with heart failure and stage 1 through stage 4 chronic kidney disease, or unspecified chronic kidney disease: Secondary | ICD-10-CM | POA: Diagnosis not present

## 2017-03-14 DIAGNOSIS — N189 Chronic kidney disease, unspecified: Secondary | ICD-10-CM | POA: Diagnosis not present

## 2017-03-14 NOTE — Telephone Encounter (Signed)
Forward to Dr Stanford Breed

## 2017-03-14 NOTE — Telephone Encounter (Signed)
New message       Pt is doing very well.  They are going to discharge her from home health services today.  Calling to let Dr Stanford Breed know this.

## 2017-04-06 DIAGNOSIS — E538 Deficiency of other specified B group vitamins: Secondary | ICD-10-CM | POA: Diagnosis not present

## 2017-04-23 ENCOUNTER — Encounter: Payer: Self-pay | Admitting: Cardiology

## 2017-04-30 NOTE — Progress Notes (Signed)
HPI: Follow-up atrial fibrillation/atrial flutter. Patient admitted January 2018 with acute pulmonary embolus and also noted to have atrial flutter. Lower extremity Dopplers also showed DVT. Echocardiogram January 2018 showed normal LV function, decreased RV function, moderate tricuspid regurgitation and moderately elevated pulmonary pressure. Since she was last seen the patient denies any dyspnea on exertion, orthopnea, PND, pedal edema, palpitations, syncope or chest pain.   Current Outpatient Prescriptions  Medication Sig Dispense Refill  . amiodarone (PACERONE) 200 MG tablet Take 1 tablet (200 mg total) by mouth daily. 60 tablet 6  . atorvastatin (LIPITOR) 20 MG tablet Take 1 tablet (20 mg total) by mouth daily at 6 PM. 30 tablet 0  . ezetimibe (ZETIA) 10 MG tablet Take 1 tablet (10 mg total) by mouth daily at 6 PM. 30 tablet 0  . metoprolol succinate (TOPROL-XL) 50 MG 24 hr tablet Take 1 tablet (50 mg total) by mouth daily. Take with or immediately following a meal. 30 tablet 0  . rivaroxaban (XARELTO) 20 MG TABS tablet Take 1 tablet (20 mg total) by mouth daily with supper. 90 tablet 3  . SitaGLIPtin-MetFORMIN HCl (JANUMET XR) [phone] MG TB24 Take 1 tablet by mouth daily.     No current facility-administered medications for this visit.      Past Medical History:  Diagnosis Date  . Chronic kidney disease   . Dementia   . Diabetes mellitus without complication ([place])   . Hypertension     Past Surgical History:  Procedure Laterality Date  . CHOLECYSTECTOMY      Social History   Social History  . Marital status: Married    Spouse name: N/A  . Number of children: N/A  . Years of education: N/A   Occupational History  . Not on file.   Social History Main Topics  . Smoking status: Never Smoker  . Smokeless tobacco: Never Used  . Alcohol use No  . Drug use: No  . Sexual activity: Not on file   Other Topics Concern  . Not on file   Social History Narrative  .  No narrative on file    Family History  Problem Relation Age of Onset  . Deep vein thrombosis Neg Hx     ROS: no fevers or chills, productive cough, hemoptysis, dysphasia, odynophagia, melena, hematochezia, dysuria, hematuria, rash, seizure activity, orthopnea, PND, pedal edema, claudication. Remaining systems are negative.  Physical Exam: Well-developed obese in no acute distress.  Skin is warm and dry.  HEENT is normal.  Neck is supple.  Chest is clear to auscultation with normal expansion.  Cardiovascular exam is regular rate and rhythm.  Abdominal exam nontender or distended. No masses palpated. Extremities show no edema. neuro grossly intact  ECG- sinus bradycardia at a rate of 55, nonspecific ST changes. personally reviewed  A/P  1 atrial fibrillation/flutter-patient remains in sinus rhythm. I previously felt that her atrial arrhythmia was related to her pulmonary embolus and right heart strain. I would like to see if she would maintain sinus rhythm without amiodarone. We will discontinue this medication and follow. She will continue xarelto. Check hemoglobin and renal function.  2 Pulmonary embolus-patient had no precipitating factors for her pulmonary embolus. We have therefore elected to continue long-term anticoagulation particularly in light of atrial arrhythmias at time of the event. Check hemoglobin and renal function.  3 hypertension-blood pressure is elevated. However she states typically controlled. I have asked him to follow this at home. Continue present medications and advance  as needed .  4 acute diastolic congestive heart failure-patient did have CHF at the time of her event likely secondary to right heart strain/right heart failure. She remains euvolemic off of diuretics.  Kirk Ruths, MD

## 2017-05-08 ENCOUNTER — Ambulatory Visit (INDEPENDENT_AMBULATORY_CARE_PROVIDER_SITE_OTHER): Payer: Medicare Other | Admitting: Cardiology

## 2017-05-08 ENCOUNTER — Encounter: Payer: Self-pay | Admitting: Cardiology

## 2017-05-08 VITALS — BP 150/74 | HR 55 | Ht 64.0 in | Wt 206.0 lb

## 2017-05-08 DIAGNOSIS — I480 Paroxysmal atrial fibrillation: Secondary | ICD-10-CM | POA: Diagnosis not present

## 2017-05-08 DIAGNOSIS — I10 Essential (primary) hypertension: Secondary | ICD-10-CM | POA: Diagnosis not present

## 2017-05-08 DIAGNOSIS — I2609 Other pulmonary embolism with acute cor pulmonale: Secondary | ICD-10-CM | POA: Diagnosis not present

## 2017-05-08 NOTE — Patient Instructions (Signed)
Medication Instructions:   STOP AMIODARONE  Labwork:  Your physician recommends that you HAVE LAB WORK TODAY   Follow-Up:  Your physician wants you to follow-up in: 6 MONTHS WITH DR [name] You will receive a reminder letter in the mail two months in advance. If you don't receive a letter, please call our office to schedule the follow-up appointment.   If you need a refill on your cardiac medications before your next appointment, please call your pharmacy.    

## 2017-05-09 LAB — BASIC METABOLIC PANEL
BUN/Creatinine Ratio: 24 (ref 12–28)
BUN: 25 mg/dL (ref 8–27)
CHLORIDE: 102 mmol/L (ref 96–106)
CO2: 24 mmol/L (ref 18–29)
CREATININE: 1.06 mg/dL — AB (ref 0.57–1.00)
Calcium: 9.6 mg/dL (ref 8.7–10.3)
GFR calc Af Amer: 57 mL/min/{1.73_m2} — ABNORMAL LOW (ref >59)
GFR, EST NON AFRICAN AMERICAN: 49 mL/min/{1.73_m2} — AB (ref >59)
Glucose: 153 mg/dL — ABNORMAL HIGH (ref 65–99)
Potassium: 4.7 mmol/L (ref 3.5–5.2)
Sodium: 143 mmol/L (ref 134–144)

## 2017-05-09 LAB — CBC
Hematocrit: 46.4 % (ref 34.0–46.6)
Hemoglobin: 15.2 g/dL (ref 11.1–15.9)
MCH: 29.3 pg (ref 26.6–33.0)
MCHC: 32.8 g/dL (ref 31.5–35.7)
MCV: 90 fL (ref 79–97)
PLATELETS: 222 10*3/uL (ref 150–379)
RBC: 5.18 x10E6/uL (ref 3.77–5.28)
RDW: 14.5 % (ref 12.3–15.4)
WBC: 8.6 10*3/uL (ref 3.4–10.8)

## 2017-05-11 DIAGNOSIS — E538 Deficiency of other specified B group vitamins: Secondary | ICD-10-CM | POA: Diagnosis not present

## 2017-05-15 DIAGNOSIS — H40013 Open angle with borderline findings, low risk, bilateral: Secondary | ICD-10-CM | POA: Diagnosis not present

## 2017-05-15 DIAGNOSIS — H02831 Dermatochalasis of right upper eyelid: Secondary | ICD-10-CM | POA: Diagnosis not present

## 2017-05-15 DIAGNOSIS — H02834 Dermatochalasis of left upper eyelid: Secondary | ICD-10-CM | POA: Diagnosis not present

## 2017-05-15 DIAGNOSIS — E119 Type 2 diabetes mellitus without complications: Secondary | ICD-10-CM | POA: Diagnosis not present

## 2017-05-15 DIAGNOSIS — Z961 Presence of intraocular lens: Secondary | ICD-10-CM | POA: Diagnosis not present

## 2017-05-15 DIAGNOSIS — H2512 Age-related nuclear cataract, left eye: Secondary | ICD-10-CM | POA: Diagnosis not present

## 2017-06-15 DIAGNOSIS — E538 Deficiency of other specified B group vitamins: Secondary | ICD-10-CM | POA: Diagnosis not present

## 2017-06-27 ENCOUNTER — Other Ambulatory Visit: Payer: Self-pay | Admitting: Family Medicine

## 2017-06-27 DIAGNOSIS — I519 Heart disease, unspecified: Secondary | ICD-10-CM | POA: Diagnosis not present

## 2017-06-27 DIAGNOSIS — E782 Mixed hyperlipidemia: Secondary | ICD-10-CM | POA: Diagnosis not present

## 2017-06-27 DIAGNOSIS — I4892 Unspecified atrial flutter: Secondary | ICD-10-CM | POA: Diagnosis not present

## 2017-06-27 DIAGNOSIS — I2782 Chronic pulmonary embolism: Secondary | ICD-10-CM | POA: Diagnosis not present

## 2017-06-27 DIAGNOSIS — R413 Other amnesia: Secondary | ICD-10-CM | POA: Diagnosis not present

## 2017-06-27 DIAGNOSIS — N183 Chronic kidney disease, stage 3 (moderate): Secondary | ICD-10-CM | POA: Diagnosis not present

## 2017-06-27 DIAGNOSIS — Z7984 Long term (current) use of oral hypoglycemic drugs: Secondary | ICD-10-CM | POA: Diagnosis not present

## 2017-06-27 DIAGNOSIS — R109 Unspecified abdominal pain: Secondary | ICD-10-CM | POA: Diagnosis not present

## 2017-06-27 DIAGNOSIS — Z1231 Encounter for screening mammogram for malignant neoplasm of breast: Secondary | ICD-10-CM | POA: Diagnosis not present

## 2017-06-27 DIAGNOSIS — Z0000 Encounter for general adult medical examination without abnormal findings: Secondary | ICD-10-CM | POA: Diagnosis not present

## 2017-06-27 DIAGNOSIS — E538 Deficiency of other specified B group vitamins: Secondary | ICD-10-CM | POA: Diagnosis not present

## 2017-06-27 DIAGNOSIS — E1122 Type 2 diabetes mellitus with diabetic chronic kidney disease: Secondary | ICD-10-CM | POA: Diagnosis not present

## 2017-06-27 DIAGNOSIS — I129 Hypertensive chronic kidney disease with stage 1 through stage 4 chronic kidney disease, or unspecified chronic kidney disease: Secondary | ICD-10-CM | POA: Diagnosis not present

## 2017-07-16 ENCOUNTER — Ambulatory Visit
Admission: RE | Admit: 2017-07-16 | Discharge: 2017-07-16 | Disposition: A | Discharge status: Home / Self Care | Payer: Medicare Other | Source: Ambulatory Visit | Attending: Family Medicine | Admitting: Family Medicine

## 2017-07-16 DIAGNOSIS — R109 Unspecified abdominal pain: Secondary | ICD-10-CM

## 2017-07-16 DIAGNOSIS — R1084 Generalized abdominal pain: Secondary | ICD-10-CM | POA: Diagnosis not present

## 2017-07-16 MED ORDER — IOPAMIDOL (ISOVUE-300) INJECTION 61%
100.0000 mL | Freq: Once | INTRAVENOUS | Status: AC | PRN
  Administered 2017-07-16: 100 mL via INTRAVENOUS

## 2017-07-20 DIAGNOSIS — E538 Deficiency of other specified B group vitamins: Secondary | ICD-10-CM | POA: Diagnosis not present

## 2017-08-17 DIAGNOSIS — E538 Deficiency of other specified B group vitamins: Secondary | ICD-10-CM | POA: Diagnosis not present

## 2017-09-14 DIAGNOSIS — E538 Deficiency of other specified B group vitamins: Secondary | ICD-10-CM | POA: Diagnosis not present

## 2017-09-14 DIAGNOSIS — Z23 Encounter for immunization: Secondary | ICD-10-CM | POA: Diagnosis not present

## 2017-10-12 DIAGNOSIS — E538 Deficiency of other specified B group vitamins: Secondary | ICD-10-CM | POA: Diagnosis not present

## 2017-11-16 DIAGNOSIS — E538 Deficiency of other specified B group vitamins: Secondary | ICD-10-CM | POA: Diagnosis not present

## 2017-12-21 DIAGNOSIS — I129 Hypertensive chronic kidney disease with stage 1 through stage 4 chronic kidney disease, or unspecified chronic kidney disease: Secondary | ICD-10-CM | POA: Diagnosis not present

## 2017-12-21 DIAGNOSIS — E538 Deficiency of other specified B group vitamins: Secondary | ICD-10-CM | POA: Diagnosis not present

## 2018-01-01 ENCOUNTER — Telehealth: Payer: Self-pay | Admitting: *Deleted

## 2018-01-01 NOTE — Telephone Encounter (Signed)
   [facility] Pre-operative Risk Assessment    Request for surgical clearance:  1. What type of surgery is being performed? DENTAL EXTRACTION  2. When is this surgery scheduled? PENDING  3. Are there any medications that need to be held prior to surgery and how long?XARELTO- THEY ARE ASKING FOR DIRECTION   4. Practice name and name of physician performing surgery? DR Mikey Bussing DDS  5. What is your office phone and fax number? PH=[phone]  FAX= [phone]  6. Anesthesia type (None, local, MAC, general) ? NOT LISTED   Catherine Gonzales 01/01/2018, 2:41 PM  _________________________________________________________________   (provider comments below)

## 2018-01-03 NOTE — Telephone Encounter (Signed)
   Primary Cardiologist: Kirk Ruths, MD  Chart reviewed as part of pre-operative protocol coverage. Prior history of atrial flutter, pulmonary embolism, and DVT, all diagnosed in 12/2016.  She has been on Xarelto therapy since.  She is pending dental extraction.  We would recommend continuation of uninterrupted Xarelto therapy before and after dental extraction.  Please call with questions.  Murray Hodgkins, NP 01/03/2018, 3:54 PM

## 2018-01-09 ENCOUNTER — Telehealth: Payer: Self-pay

## 2018-01-09 NOTE — Telephone Encounter (Signed)
   [facility] Pre-operative Risk Assessment   Received a request from dentist Dr.Robert Geralynn Ochs.Patient needs dental extraction.Request noted patient's B/P elevated 203/104.After reviewing chart patient due for follow up appointment.Patient was called no answer.[place].    _________________________________________________________________   (provider comments below)

## 2018-01-10 ENCOUNTER — Telehealth: Payer: Self-pay

## 2018-01-10 NOTE — Telephone Encounter (Signed)
Clearance note faxed to Dr.Robert Geralynn Ochs at fax # [phone].

## 2018-01-11 ENCOUNTER — Telehealth: Payer: Self-pay | Admitting: Cardiology

## 2018-01-11 ENCOUNTER — Telehealth: Payer: Self-pay

## 2018-01-11 NOTE — Telephone Encounter (Signed)
Spoke to patient advised Catherine Bayley NP advised ok to have tooth extraction.He advised do not stop Xarelto.Clearance was faxed to Dr.[facility] 01/10/18.

## 2018-01-11 NOTE — Telephone Encounter (Signed)
Please call about pt's upcoming appt.If you call after 1:00,please [phone].

## 2018-01-11 NOTE — Telephone Encounter (Signed)
Spoke with pt dtr, follow up rescheduled for Monday.

## 2018-01-14 ENCOUNTER — Encounter: Payer: Self-pay | Admitting: Adult Health

## 2018-01-14 ENCOUNTER — Ambulatory Visit (INDEPENDENT_AMBULATORY_CARE_PROVIDER_SITE_OTHER): Payer: Medicare Other | Admitting: Adult Health

## 2018-01-14 VITALS — BP 199/88 | HR 70 | Ht 64.0 in | Wt 219.2 lb

## 2018-01-14 DIAGNOSIS — E1122 Type 2 diabetes mellitus with diabetic chronic kidney disease: Secondary | ICD-10-CM | POA: Diagnosis not present

## 2018-01-14 DIAGNOSIS — I480 Paroxysmal atrial fibrillation: Secondary | ICD-10-CM | POA: Diagnosis not present

## 2018-01-14 DIAGNOSIS — Z01810 Encounter for preprocedural cardiovascular examination: Secondary | ICD-10-CM | POA: Diagnosis not present

## 2018-01-14 DIAGNOSIS — R0600 Dyspnea, unspecified: Secondary | ICD-10-CM | POA: Diagnosis not present

## 2018-01-14 DIAGNOSIS — Z7984 Long term (current) use of oral hypoglycemic drugs: Secondary | ICD-10-CM | POA: Diagnosis not present

## 2018-01-14 DIAGNOSIS — I2782 Chronic pulmonary embolism: Secondary | ICD-10-CM | POA: Diagnosis not present

## 2018-01-14 DIAGNOSIS — I700 Atherosclerosis of aorta: Secondary | ICD-10-CM | POA: Diagnosis not present

## 2018-01-14 DIAGNOSIS — R413 Other amnesia: Secondary | ICD-10-CM | POA: Diagnosis not present

## 2018-01-14 DIAGNOSIS — I10 Essential (primary) hypertension: Secondary | ICD-10-CM

## 2018-01-14 DIAGNOSIS — E782 Mixed hyperlipidemia: Secondary | ICD-10-CM | POA: Diagnosis not present

## 2018-01-14 DIAGNOSIS — Z6839 Body mass index (BMI) 39.0-39.9, adult: Secondary | ICD-10-CM | POA: Diagnosis not present

## 2018-01-14 DIAGNOSIS — N183 Chronic kidney disease, stage 3 (moderate): Secondary | ICD-10-CM | POA: Diagnosis not present

## 2018-01-14 DIAGNOSIS — I129 Hypertensive chronic kidney disease with stage 1 through stage 4 chronic kidney disease, or unspecified chronic kidney disease: Secondary | ICD-10-CM | POA: Diagnosis not present

## 2018-01-14 DIAGNOSIS — E538 Deficiency of other specified B group vitamins: Secondary | ICD-10-CM | POA: Diagnosis not present

## 2018-01-14 NOTE — Progress Notes (Signed)
Cardiology Office Note   Date:  01/14/2018   ID:  Catherine Gonzales, DOB April 09, 1934, MRN [id number]  PCP:  Mayra Neer, MD  Cardiologist:  Dr. Stanford Breed  Chief Complaint  Patient presents with  . Follow-up  . Atrial Fibrillation     History of Present Illness: Catherine Gonzales is a 84 y.o. female who presents for ongoing assessment and management of atrial fibrillation/flutter, history of PE, and DVT.  Remains on Xarelto for anticoagulation therapy.  He was last seen by Dr. Stanford Breed on 05/08/2017, at which time amiodarone was discontinued as she remained in normal sinus rhythm.  It was felt that the A. fib flutter was related to pulmonary emboli and right heart strain.  Last encounter with our office was for preoperative evaluation, to have dental extraction, on 01/03/2018.  She was cleared for this with no discontinuation of Xarelto.  She is here today in person for preoperative evaluation despite having had clearance and faxing to her PCP and to her dentist Dr. Geralynn Ochs.  Blood pressure was elevated on most recent office visit with PCP who is managing her hypertension.  She was started on lisinopril 5 mg daily.  She is recovering from a cold and continues to have some sinus drainage along with coughing.  Is taking over-the-counter Robitussin.  Past Medical History:  Diagnosis Date  . Chronic kidney disease   . Dementia   . Diabetes mellitus without complication ([place])   . Hypertension     Past Surgical History:  Procedure Laterality Date  . CHOLECYSTECTOMY       Current Outpatient Medications  Medication Sig Dispense Refill  . atorvastatin (LIPITOR) 20 MG tablet Take 1 tablet (20 mg total) by mouth daily at 6 PM. 30 tablet 0  . ezetimibe (ZETIA) 10 MG tablet Take 1 tablet (10 mg total) by mouth daily at 6 PM. 30 tablet 0  . metoprolol succinate (TOPROL-XL) 50 MG 24 hr tablet Take 1 tablet (50 mg total) by mouth daily. Take with or immediately following a meal. 30 tablet 0  .  rivaroxaban (XARELTO) 20 MG TABS tablet Take 1 tablet (20 mg total) by mouth daily with supper. 90 tablet 3  . SitaGLIPtin-MetFORMIN HCl (JANUMET XR) [phone] MG TB24 Take 1 tablet by mouth daily.    Marland Kitchen lisinopril (PRINIVIL,ZESTRIL) 5 MG tablet      No current facility-administered medications for this visit.     Allergies:   Patient has no known allergies.    Social History:  The patient  reports that  has never smoked. she has never used smokeless tobacco. She reports that she does not drink alcohol or use drugs.   Family History:  The patient's family history is not on file.    ROS: All other systems are reviewed and negative. Unless otherwise mentioned in H&P    PHYSICAL EXAM: VS:  BP (!) 199/88   Pulse 70   Ht 5\' 4"  (1.626 m)   Wt 219 lb 3.2 oz (99.4 kg)   BMI 37.63 kg/m  , BMI Body mass index is 37.63 kg/m. GEN: Well nourished, well developed, in no acute distress  HEENT: normal clear injected, runny nose, Neck: no JVD, carotid bruits, or masses Cardiac: RRR; no murmurs, rubs, or gallops,no edema  Respiratory:  clear to auscultation bilaterally, normal work of breathing GI: soft, nontender, nondistended, + BS MS: no deformity or atrophy  Skin: warm and dry, no rash Neuro:  Strength and sensation are intact Psych: euthymic mood, full affect  EKG: Normal sinus rhythm heart rate of 73 bpm, inferior Q waves noted  Recent Labs: 01/14/2017: TSH 2.006 01/19/2017: ALT 19 01/20/2017: Magnesium 1.8 05/08/2017: BUN 25; Creatinine, Ser 1.06; Hemoglobin 15.2; Platelets 222; Potassium 4.7; Sodium 143    Lipid Panel No results found for: CHOL, TRIG, HDL, CHOLHDL, VLDL, LDLCALC, LDLDIRECT    Wt Readings from Last 3 Encounters:  01/14/18 219 lb 3.2 oz (99.4 kg)  05/08/17 206 lb (93.4 kg)  02/12/17 203 lb (92.1 kg)      Other studies Reviewed: Left ventricle: The cavity size was normal. Wall thickness was   increased in a pattern of mild LVH. Systolic function was normal.    The estimated ejection fraction was in the range of 50% to 55%.   Wall motion was normal; there were no regional wall motion   abnormalities. - Ventricular septum: The contour showed diastolic flattening and   systolic flattening. - Mitral valve: Calcified annulus. - Right ventricle: Systolic function was moderately reduced. - Atrial septum: The septum bowed from right to left, consistent   with increased right atrial pressure. - Tricuspid valve: There was moderate regurgitation. - Pulmonary arteries: Systolic pressure was moderately increased.   PA peak pressure: 51 mm Hg (S).  Impressions:  - Technically difficult; definity used; normal LV systolic   function; D shaped septum; moderately reduced RV function;   moderate TR; moderately elevated pulmonary pressure.   ASSESSMENT AND PLAN:  1.  Hypertension: She is not well controlled currently.  I rechecked her blood pressure manually, found to be 178/88.  The patient has been started on lisinopril 5 mg daily by PCP and is due to see them this afternoon.  Would be my recommendation to increase lisinopril to 10 mg daily.  Consider adding HCTZ or chlorthalidone to have blood pressure range between 149 702 systolic.  She has been taking OTC Robitussin, uncertain if this is DM or not.  This may be also contributing to her blood pressure elevation.  She will need close follow-up.  The patient is taking blood pressures at home and have been ranging between 637-858 systolic.  2.  History of paroxysmal atrial fibrillation: Heart rate is currently controlled.  EKG revealing normal sinus rhythm.  She will continue rivaroxaban 20 mg daily and this will not need to be discontinued prior to her tooth extraction.  Heart rate is controlled with metoprolol 50 mg daily.  3.  Hypercholesterolemia: Continue monitor labs semiannually.  She will remain on atorvastatin 20 mg daily.  4.  Preoperative assessment: The patient has been cleared for tooth  extraction per notes, which have also been faxed to her PCP and to her dentist Dr. Geralynn Ochs.  She is cleared again, to have his extraction, he continued to take Xarelto.  I have given her copies of the notes that have cleared her for her to take to her dentist.   Current medicines are reviewed at length with the patient today.    Labs/ tests ordered today include: None  Phill Myron. West Pugh, ANP, AACC   01/14/2018 9:20 AM    [facility] [address], [place], [place] [address] Phone: [phone]; Fax: [phone]

## 2018-01-14 NOTE — Patient Instructions (Signed)
Medication Instructions:  NO CHANGES-Your physician recommends that you continue on your current medications as directed. Please refer to the Current Medication list given to you today.  If you need a refill on your cardiac medications before your next appointment, please call your pharmacy.   Follow-Up: Your physician wants you to follow-up in: [place]. You should receive a reminder letter in the mail two months in advance. If you do not receive a letter, please call our office MAY 2019 to schedule the July 2019 follow-up appointment.   Thank you for choosing [facility] at [facility]!!

## 2018-01-17 ENCOUNTER — Other Ambulatory Visit: Payer: Self-pay | Admitting: Family Medicine

## 2018-01-17 DIAGNOSIS — R0600 Dyspnea, unspecified: Secondary | ICD-10-CM

## 2018-01-23 ENCOUNTER — Ambulatory Visit: Payer: Medicare Other | Admitting: Adult Health

## 2018-01-25 ENCOUNTER — Other Ambulatory Visit: Payer: Self-pay

## 2018-01-25 ENCOUNTER — Ambulatory Visit (HOSPITAL_COMMUNITY): Payer: Medicare Other | Attending: Cardiovascular Disease

## 2018-01-25 DIAGNOSIS — I4891 Unspecified atrial fibrillation: Secondary | ICD-10-CM | POA: Insufficient documentation

## 2018-01-25 DIAGNOSIS — I4892 Unspecified atrial flutter: Secondary | ICD-10-CM | POA: Diagnosis not present

## 2018-01-25 DIAGNOSIS — I1310 Hypertensive heart and chronic kidney disease without heart failure, with stage 1 through stage 4 chronic kidney disease, or unspecified chronic kidney disease: Secondary | ICD-10-CM | POA: Insufficient documentation

## 2018-01-25 DIAGNOSIS — N189 Chronic kidney disease, unspecified: Secondary | ICD-10-CM | POA: Diagnosis not present

## 2018-01-25 DIAGNOSIS — E1122 Type 2 diabetes mellitus with diabetic chronic kidney disease: Secondary | ICD-10-CM | POA: Diagnosis not present

## 2018-01-25 DIAGNOSIS — Z86718 Personal history of other venous thrombosis and embolism: Secondary | ICD-10-CM | POA: Insufficient documentation

## 2018-01-25 DIAGNOSIS — I272 Pulmonary hypertension, unspecified: Secondary | ICD-10-CM | POA: Diagnosis not present

## 2018-01-25 DIAGNOSIS — Z86711 Personal history of pulmonary embolism: Secondary | ICD-10-CM | POA: Diagnosis not present

## 2018-01-25 DIAGNOSIS — R0600 Dyspnea, unspecified: Secondary | ICD-10-CM | POA: Insufficient documentation

## 2018-01-25 DIAGNOSIS — E538 Deficiency of other specified B group vitamins: Secondary | ICD-10-CM | POA: Diagnosis not present

## 2018-01-28 ENCOUNTER — Ambulatory Visit: Payer: Medicare Other | Admitting: Adult Health

## 2018-02-04 ENCOUNTER — Other Ambulatory Visit (HOSPITAL_COMMUNITY): Payer: Self-pay | Admitting: Respiratory Therapy

## 2018-02-04 DIAGNOSIS — R0600 Dyspnea, unspecified: Secondary | ICD-10-CM

## 2018-02-15 ENCOUNTER — Ambulatory Visit (HOSPITAL_COMMUNITY)
Admission: RE | Admit: 2018-02-15 | Discharge: 2018-02-15 | Disposition: A | Discharge status: Home / Self Care | Payer: Medicare Other | Source: Ambulatory Visit | Attending: Family Medicine | Admitting: Family Medicine

## 2018-02-15 DIAGNOSIS — R0600 Dyspnea, unspecified: Secondary | ICD-10-CM | POA: Insufficient documentation

## 2018-02-15 LAB — PULMONARY FUNCTION TEST
DL/VA % pred: 96 %
DL/VA: 4.63 ml/min/mmHg/L
DLCO unc % pred: 68 %
DLCO unc: 16.57 ml/min/mmHg
FEF 25-75 Post: 1.73 L/sec
FEF 25-75 Pre: 1.3 L/sec
FEF2575-%CHANGE-POST: 32 %
FEF2575-%PRED-POST: 131 %
FEF2575-%Pred-Pre: 99 %
FEV1-%CHANGE-POST: 10 %
FEV1-%PRED-POST: 86 %
FEV1-%PRED-PRE: 78 %
FEV1-POST: 1.61 L
FEV1-PRE: 1.46 L
FEV1FVC-%Change-Post: 0 %
FEV1FVC-%PRED-PRE: 109 %
FEV6-%Change-Post: 9 %
FEV6-%PRED-POST: 84 %
FEV6-%Pred-Pre: 77 %
FEV6-POST: 2.01 L
FEV6-Pre: 1.84 L
FEV6FVC-%Pred-Post: 106 %
FEV6FVC-%Pred-Pre: 106 %
FVC-%CHANGE-POST: 9 %
FVC-%PRED-POST: 79 %
FVC-%PRED-PRE: 72 %
FVC-POST: 2.01 L
FVC-Pre: 1.84 L
POST FEV1/FVC RATIO: 80 %
PRE FEV6/FVC RATIO: 100 %
Post FEV6/FVC ratio: 100 %
Pre FEV1/FVC ratio: 80 %
RV % PRED: 109 %
RV: 2.66 L
TLC % pred: 95 %
TLC: 4.84 L

## 2018-02-15 MED ORDER — ALBUTEROL SULFATE (2.5 MG/3ML) 0.083% IN NEBU
2.5000 mg | INHALATION_SOLUTION | Freq: Once | RESPIRATORY_TRACT | Status: AC
  Administered 2018-02-15: 2.5 mg via RESPIRATORY_TRACT

## 2018-02-22 DIAGNOSIS — E538 Deficiency of other specified B group vitamins: Secondary | ICD-10-CM | POA: Diagnosis not present

## 2018-02-23 ENCOUNTER — Other Ambulatory Visit: Payer: Self-pay | Admitting: Cardiology

## 2018-02-25 ENCOUNTER — Telehealth: Payer: Self-pay | Admitting: Adult Health

## 2018-02-25 MED ORDER — RIVAROXABAN 20 MG PO TABS: 20.0000 mg | ORAL_TABLET | Freq: Every day | ORAL | 0 refills | Status: DC

## 2018-02-25 NOTE — Telephone Encounter (Signed)
New Message  Patient calling the office for samples of medication:   1.  What medication and dosage are you requesting samples for? Xarelto 20mg    2.  Are you currently out of this medication? No have about 3 days left,  Requesting a week supply of the Xarelto why they wait on the mail order to come in. Alternate contact number is [phone]. A detail message can be left on [phone] if you do not get a hold of Thad Ranger the daughter.

## 2018-02-25 NOTE — Telephone Encounter (Signed)
SPOKE DAUGHTER WEEK SAMPLE AVAILABLE LEFT AT FRONT DESK.   [place] SCRIPT PER [facility] PHARMACIST

## 2018-03-22 DIAGNOSIS — E538 Deficiency of other specified B group vitamins: Secondary | ICD-10-CM | POA: Diagnosis not present

## 2018-05-14 DIAGNOSIS — Z961 Presence of intraocular lens: Secondary | ICD-10-CM | POA: Diagnosis not present

## 2018-05-14 DIAGNOSIS — H02834 Dermatochalasis of left upper eyelid: Secondary | ICD-10-CM | POA: Diagnosis not present

## 2018-05-14 DIAGNOSIS — H02831 Dermatochalasis of right upper eyelid: Secondary | ICD-10-CM | POA: Diagnosis not present

## 2018-05-14 DIAGNOSIS — H04411 Chronic dacryocystitis of right lacrimal passage: Secondary | ICD-10-CM | POA: Diagnosis not present

## 2018-05-14 DIAGNOSIS — H40013 Open angle with borderline findings, low risk, bilateral: Secondary | ICD-10-CM | POA: Diagnosis not present

## 2018-05-14 DIAGNOSIS — H2512 Age-related nuclear cataract, left eye: Secondary | ICD-10-CM | POA: Diagnosis not present

## 2018-05-14 DIAGNOSIS — E119 Type 2 diabetes mellitus without complications: Secondary | ICD-10-CM | POA: Diagnosis not present

## 2018-05-14 DIAGNOSIS — H10413 Chronic giant papillary conjunctivitis, bilateral: Secondary | ICD-10-CM | POA: Diagnosis not present

## 2018-06-02 IMAGING — CR DG CHEST 2V
2 series · 2 of 2 positions shown · non-contrast
Comparison: 04/24/2013.

CLINICAL DATA: Shortness of breath.

EXAM:
CHEST  2 VIEW

[w chest pa]
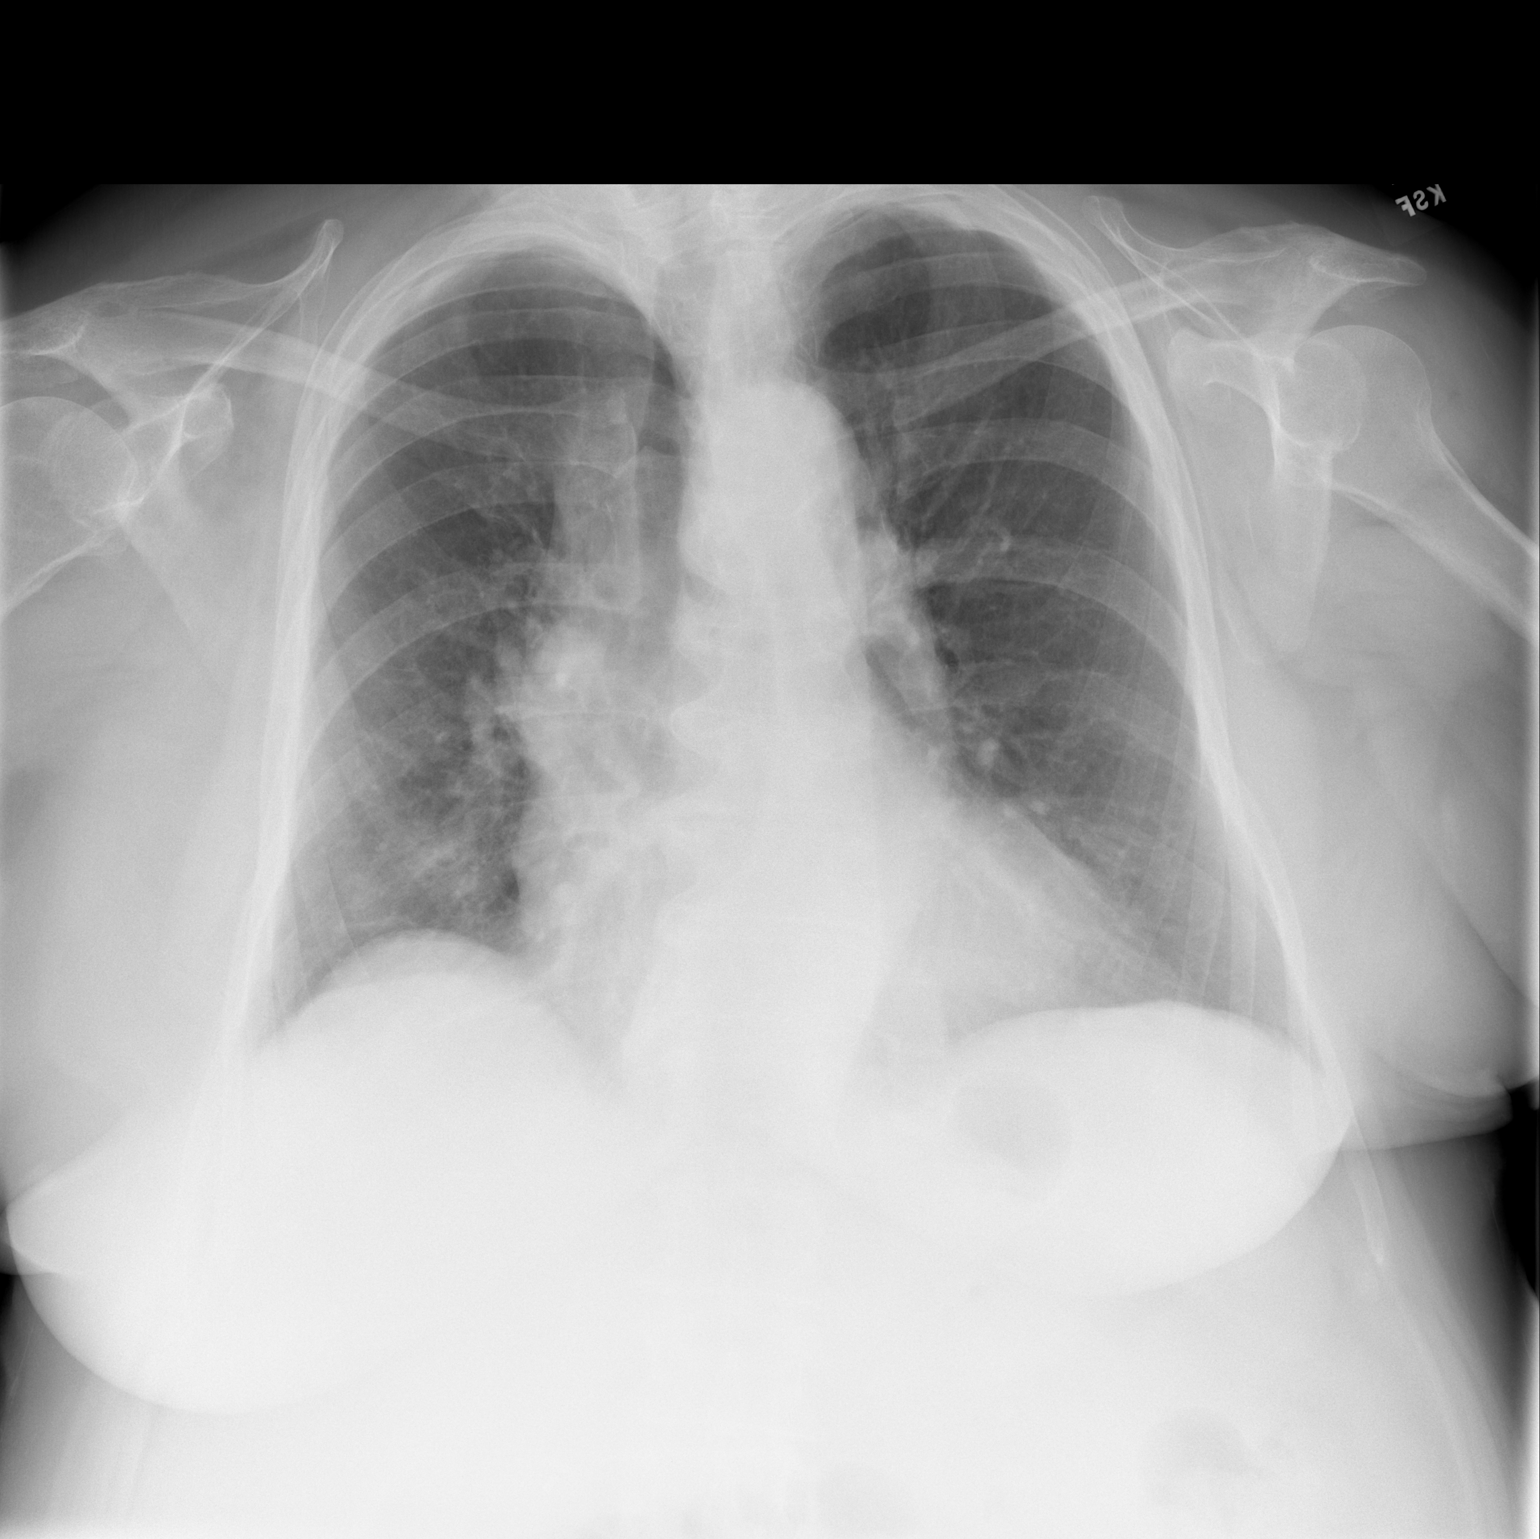

[w chest lat]
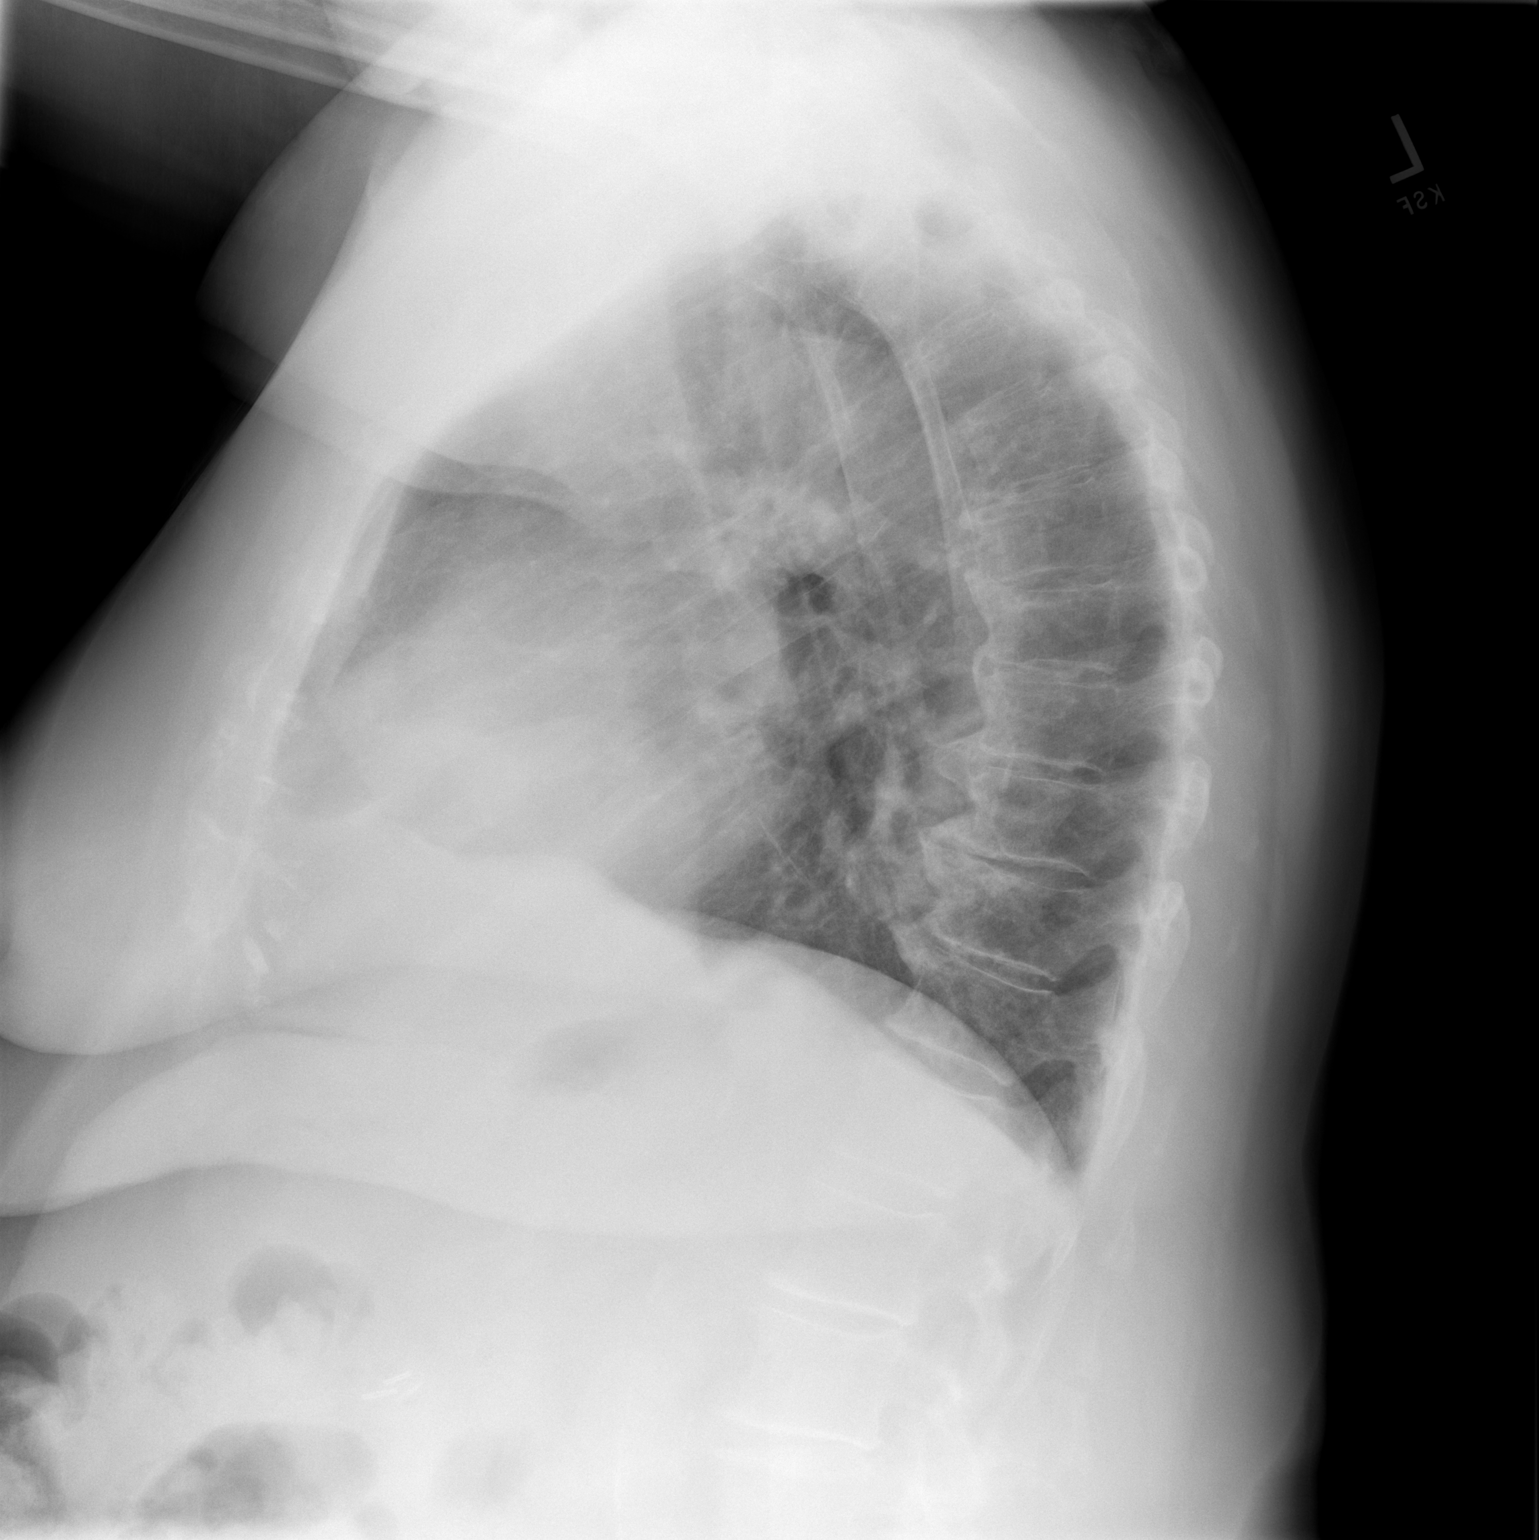

[2 of 2 positions shown; findings below may reference images not displayed]

FINDINGS: Patient is rotated to the right. Mild cardiomegaly with normal
pulmonary vascularity. Heart size stable. Mild right base infiltrate
cannot be excluded. No pleural effusion or pneumothorax.
Degenerative changes thoracic spine .
IMPRESSION: 1. Mild right base infiltrate cannot be excluded.

2.  Stable cardiomegaly.

## 2018-06-05 ENCOUNTER — Telehealth: Payer: Self-pay | Admitting: Cardiology

## 2018-06-05 NOTE — Telephone Encounter (Signed)
Medication samples have been provided to the patient.  Drug name: Xarelto 20 mg   Qty: 21     LOT: [id number]  Exp.Date: 04/21  Samples left at front desk for patient pick-up. Daughter made aware

## 2018-06-05 NOTE — Telephone Encounter (Signed)
NEW MESSAGE   Patient calling the office for samples of medication:   1.  What medication and dosage are you requesting samples for? rivaroxaban (XARELTO) 20 MG TABS tablet  2.  Are you currently out of this medication? NO

## 2018-07-10 DIAGNOSIS — R000 Tachycardia, unspecified: Secondary | ICD-10-CM | POA: Diagnosis not present

## 2018-07-10 DIAGNOSIS — Z0000 Encounter for general adult medical examination without abnormal findings: Secondary | ICD-10-CM | POA: Diagnosis not present

## 2018-07-10 DIAGNOSIS — I4892 Unspecified atrial flutter: Secondary | ICD-10-CM | POA: Diagnosis not present

## 2018-07-10 DIAGNOSIS — I129 Hypertensive chronic kidney disease with stage 1 through stage 4 chronic kidney disease, or unspecified chronic kidney disease: Secondary | ICD-10-CM | POA: Diagnosis not present

## 2018-07-10 DIAGNOSIS — I519 Heart disease, unspecified: Secondary | ICD-10-CM | POA: Diagnosis not present

## 2018-07-10 DIAGNOSIS — E1122 Type 2 diabetes mellitus with diabetic chronic kidney disease: Secondary | ICD-10-CM | POA: Diagnosis not present

## 2018-07-10 DIAGNOSIS — E538 Deficiency of other specified B group vitamins: Secondary | ICD-10-CM | POA: Diagnosis not present

## 2018-07-10 DIAGNOSIS — N183 Chronic kidney disease, stage 3 (moderate): Secondary | ICD-10-CM | POA: Diagnosis not present

## 2018-07-10 DIAGNOSIS — E782 Mixed hyperlipidemia: Secondary | ICD-10-CM | POA: Diagnosis not present

## 2018-07-10 DIAGNOSIS — I2782 Chronic pulmonary embolism: Secondary | ICD-10-CM | POA: Diagnosis not present

## 2018-07-10 DIAGNOSIS — R413 Other amnesia: Secondary | ICD-10-CM | POA: Diagnosis not present

## 2018-07-10 DIAGNOSIS — F322 Major depressive disorder, single episode, severe without psychotic features: Secondary | ICD-10-CM | POA: Diagnosis not present

## 2018-07-11 ENCOUNTER — Encounter (HOSPITAL_COMMUNITY): Payer: Self-pay | Admitting: Emergency Medicine

## 2018-07-11 ENCOUNTER — Emergency Department (HOSPITAL_COMMUNITY)
Admission: EM | Admit: 2018-07-11 | Discharge: 2018-07-11 | Disposition: A | Discharge status: Home / Self Care | Payer: Medicare Other | Attending: Emergency Medicine | Admitting: Emergency Medicine

## 2018-07-11 ENCOUNTER — Emergency Department (HOSPITAL_COMMUNITY): Payer: Medicare Other

## 2018-07-11 ENCOUNTER — Other Ambulatory Visit: Payer: Self-pay

## 2018-07-11 DIAGNOSIS — I129 Hypertensive chronic kidney disease with stage 1 through stage 4 chronic kidney disease, or unspecified chronic kidney disease: Secondary | ICD-10-CM | POA: Diagnosis not present

## 2018-07-11 DIAGNOSIS — I4891 Unspecified atrial fibrillation: Secondary | ICD-10-CM | POA: Diagnosis not present

## 2018-07-11 DIAGNOSIS — Z7984 Long term (current) use of oral hypoglycemic drugs: Secondary | ICD-10-CM | POA: Insufficient documentation

## 2018-07-11 DIAGNOSIS — Z79899 Other long term (current) drug therapy: Secondary | ICD-10-CM | POA: Diagnosis not present

## 2018-07-11 DIAGNOSIS — E1122 Type 2 diabetes mellitus with diabetic chronic kidney disease: Secondary | ICD-10-CM | POA: Diagnosis not present

## 2018-07-11 DIAGNOSIS — N189 Chronic kidney disease, unspecified: Secondary | ICD-10-CM | POA: Insufficient documentation

## 2018-07-11 DIAGNOSIS — R000 Tachycardia, unspecified: Secondary | ICD-10-CM | POA: Diagnosis not present

## 2018-07-11 DIAGNOSIS — Z0189 Encounter for other specified special examinations: Secondary | ICD-10-CM

## 2018-07-11 HISTORY — DX: Unspecified atrial fibrillation: I48.91

## 2018-07-11 LAB — CBC
HCT: 44.4 % (ref 36.0–46.0)
Hemoglobin: 13.8 g/dL (ref 12.0–15.0)
MCH: 29.1 pg (ref 26.0–34.0)
MCHC: 31.1 g/dL (ref 30.0–36.0)
MCV: 93.5 fL (ref 78.0–100.0)
PLATELETS: 232 10*3/uL (ref 150–400)
RBC: 4.75 MIL/uL (ref 3.87–5.11)
RDW: 14.4 % (ref 11.5–15.5)
WBC: 9.9 10*3/uL (ref 4.0–10.5)

## 2018-07-11 LAB — BASIC METABOLIC PANEL
Anion gap: 10 (ref 5–15)
BUN: 30 mg/dL — AB (ref 8–23)
CALCIUM: 9.1 mg/dL (ref 8.9–10.3)
CHLORIDE: 109 mmol/L (ref 98–111)
CO2: 22 mmol/L (ref 22–32)
CREATININE: 1.53 mg/dL — AB (ref 0.44–1.00)
GFR calc Af Amer: 35 mL/min — ABNORMAL LOW (ref >60)
GFR calc non Af Amer: 31 mL/min — ABNORMAL LOW (ref >60)
Glucose, Bld: 193 mg/dL — ABNORMAL HIGH (ref 70–99)
Potassium: 4.7 mmol/L (ref 3.5–5.1)
Sodium: 141 mmol/L (ref 135–145)

## 2018-07-11 LAB — I-STAT TROPONIN, ED: TROPONIN I, POC: 0.03 ng/mL (ref 0.00–0.08)

## 2018-07-11 LAB — MAGNESIUM: Magnesium: 1.6 mg/dL — ABNORMAL LOW (ref 1.7–2.4)

## 2018-07-11 MED ORDER — ETOMIDATE 2 MG/ML IV SOLN
0.1000 mg/kg | Freq: Once | INTRAVENOUS | Status: DC
  Filled 2018-07-11: qty 10

## 2018-07-11 MED ORDER — SODIUM CHLORIDE 0.9 % IV SOLN
INTRAVENOUS | Status: DC
  Administered 2018-07-11: 13:00:00 via INTRAVENOUS

## 2018-07-11 MED ORDER — ETOMIDATE 2 MG/ML IV SOLN
INTRAVENOUS | Status: AC | PRN
  Administered 2018-07-11: 9.3 mg via INTRAVENOUS

## 2018-07-11 NOTE — ED Notes (Signed)
PA at bedside. Patient denies any chest pain, SOB

## 2018-07-11 NOTE — ED Provider Notes (Signed)
.Cardioversion Date/Time: 07/11/2018 1:31 PM Performed by: Sharlett Iles, MD Authorized by: Sharlett Iles, MD   Consent:    Consent obtained:  Written   Consent given by:  Patient and healthcare agent   Risks discussed:  Pain, induced arrhythmia, death and cutaneous burn   Alternatives discussed:  Rate-control medication Pre-procedure details:    Cardioversion basis:  Emergent   Rhythm:  Atrial fibrillation   Electrode placement:  Anterior-posterior Patient sedated: Yes. Refer to sedation procedure documentation for details of sedation.  Attempt one:    Cardioversion mode:  Synchronous   Waveform:  Biphasic   Shock (Joules):  200   Shock outcome:  Conversion to normal sinus rhythm Post-procedure details:    Patient status:  Alert   Patient tolerance of procedure:  Tolerated well, no immediate complications .Sedation Date/Time: 07/11/2018 1:32 PM Performed by: Sharlett Iles, MD Authorized by: Sharlett Iles, MD   Consent:    Consent obtained:  Verbal and written   Consent given by:  Patient and healthcare agent   Risks discussed:  Allergic reaction, inadequate sedation, prolonged hypoxia resulting in organ damage, prolonged sedation necessitating reversal and respiratory compromise necessitating ventilatory assistance and intubation   Alternatives discussed:  Anxiolysis Universal protocol:    Immediately prior to procedure a time out was called: yes   Indications:    Procedure performed:  Cardioversion   Procedure necessitating sedation performed by:  Physician performing sedation   Intended level of sedation:  Deep Pre-sedation assessment:    Time since last food or drink:  4   NPO status caution: urgency dictates proceeding with non-ideal NPO status     ASA classification: class 2 - patient with mild systemic disease     Neck mobility: normal     Mouth opening:  3 or more finger widths   Thyromental distance:  4 finger widths   Mallampati  score:  II - soft palate, uvula, fauces visible   Pre-sedation assessments completed and reviewed: airway patency, cardiovascular function, hydration status, mental status, nausea/vomiting, pain level and respiratory function   Immediate pre-procedure details:    Reassessment: Patient reassessed immediately prior to procedure     Reviewed: vital signs, relevant labs/tests and NPO status     Verified: bag valve mask available, emergency equipment available, intubation equipment available, IV patency confirmed, oxygen available and suction available   Procedure details (see MAR for exact dosages):    Preoxygenation:  Nasal cannula   Sedation:  Etomidate   Intra-procedure monitoring:  Blood pressure monitoring, continuous pulse oximetry, continuous capnometry, cardiac monitor, frequent vital sign checks and frequent LOC assessments   Intra-procedure events: none     Total Provider sedation time (minutes):  5 Post-procedure details:    Attendance: Constant attendance by certified staff until patient recovered     Post-sedation assessments completed and reviewed: airway patency, cardiovascular function, mental status and respiratory function     Patient is stable for discharge or admission: yes     Patient tolerance:  Tolerated well, no immediate complications   Patient with history of A. fib on anticoagulation presents with elevated heart rate that was incidentally noted at a clinic visit yesterday.  She has been completely asymptomatic with no chest pain, palpitations, or shortness of breath.  On exam, she was comfortable and well-appearing, heart rate in the 150s and irregularly irregular rhythm.  Asked with Dr. Radford Pax, cardiologist, who recommended cardioversion and discharge home with follow-up in the A. fib clinic.  Cardioversion  was successful after one attempt, see procedure note for details.  Patient tolerated well.  We will monitor until she is drinking and ambulatory and will discharge with  close cardiology follow-up.     Malyia Moro, Wenda Overland, MD 07/11/18 1335

## 2018-07-11 NOTE — ED Notes (Addendum)
Onset of atrial fib - unk onset - states feels no different than usual - reports no h/o atrial fib; however, was on Amiodarone "while in hospital"; saw PCP yesterday - was instructed to increase med doses at home and if no better today then to present to ED

## 2018-07-11 NOTE — Discharge Instructions (Addendum)
Continue all home medications.  Follow up with atrial fibrillation clinic.   Return for persistent heart rate elevated greater than 110, chest pain or shortness of breath, light-headedness, palpitations.

## 2018-07-11 NOTE — ED Provider Notes (Signed)
Catherine Gonzales   CSN: [id number] Arrival date & time: 07/11/18  1029     History   Chief Complaint Chief Complaint  Patient presents with  . Atrial Fibrillation    HPI Catherine Gonzales is a 84 y.o. female with history of atrial fibrillation/flutter, history of PE and DVT, on Xarelto and metoprolol, hypertension, diabetes is here for evaluation of sudden onset persistent elevated heart rate noted at PCP wellness visit yesterday at 11 AM.  No provoking or alleviating factors. Patient is asymptomatic, denies associated headache, vision changes, nausea, vomiting, chest discomfort, shortness of breath, palpitations, abdominal pain, lightheadedness.  She has been compliant with metoprolol and Xarelto in the last 4 to 6 weeks without missing a dose.  She denies any recent illnesses, fevers, chills, cough, changes in bowel movements.  No urinary symptoms.  Was instructed by PCP to take an extra dose of metoprolol that she took at 7:30 PM yesterday.  She has not had any of her medications this morning.  Last ate a piece of toast around 9 AM today.  HPI  Past Medical History:  Diagnosis Date  . A-fib ([place])   . Chronic kidney disease   . Dementia   . Diabetes mellitus without complication ([place])   . Hypertension     Patient Active Problem List   Diagnosis Date Noted  . DVT of lower extremity, bilateral ([place]): peroneal veins 01/15/2017  . Memory deficit 01/14/2017  . Hypertension 01/14/2017  . Diabetes mellitus type II, non insulin dependent ([place]) 01/14/2017  . Atrial flutter ([place]) 01/14/2017  . Pulmonary embolus ([place]) 01/13/2017  . Kidney disease 01/13/2017    Past Surgical History:  Procedure Laterality Date  . CHOLECYSTECTOMY       OB History   None      Home Medications    Prior to Admission medications   Medication Sig Start Date End Date Taking? Authorizing Provider  atorvastatin (LIPITOR) 20 MG tablet Take 1 tablet (20  mg total) by mouth daily at 6 PM. 01/20/17  Yes [name], MD  ezetimibe (ZETIA) 10 MG tablet Take 1 tablet (10 mg total) by mouth daily at 6 PM. 01/20/17  Yes [name], MD  lisinopril (PRINIVIL,ZESTRIL) 10 MG tablet Take 10 mg by mouth daily.  12/25/17  Yes [provider]  metoprolol succinate (TOPROL-XL) 50 MG 24 hr tablet Take 1 tablet (50 mg total) by mouth daily. Take with or immediately following a meal. 01/20/17  Yes [name], MD  rivaroxaban (XARELTO) 20 MG TABS tablet Take 1 tablet (20 mg total) by mouth daily with supper. 02/25/18  Yes Leonie Man, MD  SitaGLIPtin-MetFORMIN HCl (JANUMET XR) [phone] MG TB24 Take 1 tablet by mouth daily.   Yes [provider]  vitamin B-12 (CYANOCOBALAMIN) 1000 MCG tablet Take 1,000 mcg by mouth daily.   Yes [provider]  sertraline (ZOLOFT) 50 MG tablet Take 50 mg by mouth daily. 07/10/18   [provider]    Family History Family History  Problem Relation Age of Onset  . Deep vein thrombosis Neg Hx     Social History Social History   Tobacco Use  . Smoking status: Never Smoker  . Smokeless tobacco: Never Used  Substance Use Topics  . Alcohol use: No  . Drug use: No     Allergies   Patient has no known allergies.   Review of Systems Review of Systems  Hematological: Bruises/bleeds easily.  All other systems reviewed and are negative.    Physical Exam Updated Vital Signs BP 111/71 (BP Location: Left Arm)   Pulse 84   Temp 98.3 F (36.8 C) (Oral)   Resp 20   Ht 5\' 4"  (1.626 m)   Wt 93 kg (205 lb)   SpO2 98%   BMI 35.19 kg/m   Physical Exam  Constitutional: She appears well-developed and well-nourished.  NAD. Non toxic.   HENT:  Head: Normocephalic and atraumatic.  Nose: Nose normal.  Eyes: Conjunctivae, EOM and lids are normal.  Neck: Trachea normal and normal range of motion.  Trachea midline.   Cardiovascular: Normal rate, regular rhythm, S1 normal,  S2 normal and normal heart sounds.  Pulses:      Carotid pulses are 2+ on the right side, and 2+ on the left side.      Radial pulses are 2+ on the right side, and 2+ on the left side.       Dorsalis pedis pulses are 2+ on the right side, and 2+ on the left side.  No LE edema or calf tenderness.   Pulmonary/Chest: Effort normal and breath sounds normal.  No anterior chest wall tenderness. CP not reproducible with AROM of upper extremities.  Abdominal: Soft. Bowel sounds are normal. There is no tenderness.  No epigastric tenderness. No distention.   Neurological: She is alert. GCS eye subscore is 4. GCS verbal subscore is 5. GCS motor subscore is 6.  Skin: Skin is warm and dry. Capillary refill takes less than 2 seconds.  No rash to chest wall  Psychiatric: Her speech is normal and behavior is normal. Thought content normal. Cognition and memory are normal.     ED Treatments / Results  Labs (all labs ordered are listed, but only abnormal results are displayed) Labs Reviewed  BASIC METABOLIC PANEL - Abnormal; Notable for the following components:      Result Value   Glucose, Bld 193 (*)    BUN 30 (*)    Creatinine, Ser 1.53 (*)    GFR calc non Af Amer 31 (*)    GFR calc Af Amer 35 (*)    All other components within normal limits  MAGNESIUM - Abnormal; Notable for the following components:   Magnesium 1.6 (*)    All other components within normal limits  CBC  I-STAT TROPONIN, ED    EKG EKG Interpretation  Date/Time:  Thursday July 11 2018 10:34:11 EDT Ventricular Rate:  156 PR Interval:    QRS Duration: 152 QT Interval:  298 QTC Calculation: 480 R Axis:   -24 Text Interpretation:  Wide QRS tachycardia Non-specific intra-ventricular conduction block Possible Inferior infarct , age undetermined Abnormal ECG A flutter with RVR Confirmed by Theotis Burrow [phone]) on 07/11/2018 11:01:54 AM   Radiology Dg Chest Port 1 View  Result Date: 07/11/2018 CLINICAL DATA:  Atrial  fibrillation with tachycardia EXAM: PORTABLE CHEST 1 VIEW COMPARISON:  January 16, 2017 FINDINGS: No edema or consolidation. Heart is mildly enlarged with pulmonary vascularity normal. No adenopathy. There is aortic atherosclerosis. There is degenerative change in each shoulder. IMPRESSION: Mild cardiac enlargement. There is aortic atherosclerosis. No edema or consolidation. Aortic Atherosclerosis (ICD10-I70.0). Electronically Signed   By: Lowella Grip III M.D.   On: 07/11/2018 11:31    Procedures .Critical Care Performed by: Kinnie Feil, PA-C Authorized by: Kinnie Feil, PA-C   Critical care provider statement:    Critical care time (minutes):  35   Critical care  time was exclusive of:  Separately billable procedures and treating other patients   Critical care was necessary to treat or prevent imminent or life-threatening deterioration of the following conditions: atrial fibrillation with RVR    Critical care was time spent personally by me on the following activities:  Discussions with consultants, evaluation of patient's response to treatment, examination of patient, obtaining history from patient or surrogate, ordering and review of laboratory studies, ordering and review of radiographic studies, re-evaluation of patient's condition and review of old charts   I assumed direction of critical care for this patient from another provider in my specialty: no     (including critical care time)  Medications Ordered in ED Medications  0.9 %  sodium chloride infusion ( Intravenous Stopped 07/11/18 1430)  etomidate (AMIDATE) injection 9.3 mg (9.3 mg Intravenous Canceled Entry 07/11/18 1323)  etomidate (AMIDATE) injection (9.3 mg Intravenous Given 07/11/18 1323)     Initial Impression / Assessment and Plan / ED Course  I have reviewed the triage vital signs and the nursing notes.  Pertinent labs & imaging results that were available during my care of the patient were reviewed by  me and considered in my medical decision making (see chart for details).  Clinical Course as of Jul 11 1453  Thu Jul 11, 2018  1150 Creatinine(!): 1.53 [CG]  1256 Discussed plan for electrical cardioversion with patient and daughter. Discussed conscious sedation, indications and risks of procedure. They are in agreement with plan.    [CG]    Clinical Course User Index [CG] Kinnie Feil, PA-C   84 yo F with history of paroxysmal afib/flutter noted yesterday at 11 am, compliant on metoprolo and xarelto in last 4 weeks. Asymptomatic. HD stable in ER.  Has never required electrical cardioversion in the past.    Spoke to Dr Radford Pax who recommends cardioversion in ER. Anticipate discharge with a-fib clinic f/u in 1 week.   Final Clinical Impressions(s) / ED Diagnoses   Discussed conscious sedation, electrical cardioversion procedure, indications, risks and benefits with patient and daughter who agreed with procedure. Pt successfully converted in ER after first attempt. HR 150s > 70s.  She was observed in ER, ambulated and tolerated PO without issues.  Discharged on same medications per cardiology and f/u in a-fib clinic in 1 week. VSS at discharge. Discussed return precautions.  Shared visit with Dr. Rex Kras.  Final diagnoses:  Atrial fibrillation with RVR [facility])  Encounter for cardioversion procedure    ED Discharge Orders        Ordered    Amb referral to AFIB Clinic     07/11/18 [place], Claudia J, PA-C 07/11/18 1454    Little, Wenda Overland, MD 07/12/18 [phone]

## 2018-07-11 NOTE — ED Triage Notes (Signed)
Was seen yesterday at PCP and her heart rate was up and she was told if heart rate still up to come to er it was 150 on her pulse ox this am

## 2018-07-18 ENCOUNTER — Encounter (HOSPITAL_COMMUNITY): Payer: Self-pay | Admitting: Nurse Practitioner

## 2018-07-18 ENCOUNTER — Telehealth: Payer: Self-pay | Admitting: Cardiology

## 2018-07-18 ENCOUNTER — Ambulatory Visit (HOSPITAL_COMMUNITY)
Admit: 2018-07-18 | Discharge: 2018-07-18 | Disposition: A | Discharge status: Home / Self Care | Payer: Medicare Other | Source: Ambulatory Visit | Attending: Nurse Practitioner | Admitting: Nurse Practitioner

## 2018-07-18 VITALS — BP 132/74 | HR 66 | Ht 64.0 in | Wt 207.0 lb

## 2018-07-18 DIAGNOSIS — Z7984 Long term (current) use of oral hypoglycemic drugs: Secondary | ICD-10-CM | POA: Insufficient documentation

## 2018-07-18 DIAGNOSIS — I129 Hypertensive chronic kidney disease with stage 1 through stage 4 chronic kidney disease, or unspecified chronic kidney disease: Secondary | ICD-10-CM | POA: Insufficient documentation

## 2018-07-18 DIAGNOSIS — I4891 Unspecified atrial fibrillation: Secondary | ICD-10-CM | POA: Diagnosis not present

## 2018-07-18 DIAGNOSIS — Z7901 Long term (current) use of anticoagulants: Secondary | ICD-10-CM | POA: Insufficient documentation

## 2018-07-18 DIAGNOSIS — Z9049 Acquired absence of other specified parts of digestive tract: Secondary | ICD-10-CM | POA: Diagnosis not present

## 2018-07-18 DIAGNOSIS — Z79899 Other long term (current) drug therapy: Secondary | ICD-10-CM | POA: Diagnosis not present

## 2018-07-18 DIAGNOSIS — F0390 Unspecified dementia without behavioral disturbance: Secondary | ICD-10-CM | POA: Insufficient documentation

## 2018-07-18 DIAGNOSIS — N189 Chronic kidney disease, unspecified: Secondary | ICD-10-CM | POA: Diagnosis not present

## 2018-07-18 DIAGNOSIS — I4892 Unspecified atrial flutter: Secondary | ICD-10-CM

## 2018-07-18 DIAGNOSIS — E1122 Type 2 diabetes mellitus with diabetic chronic kidney disease: Secondary | ICD-10-CM | POA: Insufficient documentation

## 2018-07-18 NOTE — Progress Notes (Signed)
Primary Care Physician: Mayra Neer, MD Referring Physician: [facility] ER f/u   Catherine Gonzales is a 84 y.o. female with a h/o afib/flutter, DM, dementia, HTN, that presented to PCP office and found to be in rapid aflutter. Pt was asymptomatic. She was sent to the ER and successfully cardioverted.SHe was already on xarelto 20 mg daily. She is in the afib office for ER f/u, evaluation today. The pt states that she feels fine. She is unclear re her h/o of afib and initially stated that she has never had in the past, but it has been mentioned in several cardiology notes, but I think her buren has been  Low. She is here with her son today, she is a poor historian.She states that she was in her usual state of health, does not drink alcohol, no tobacco minimal caffeine, ? snoring as she lives alone.    Today, she denies symptoms of palpitations, chest pain, shortness of breath, orthopnea, PND, lower extremity edema, dizziness, presyncope, syncope, or neurologic sequela. The patient is tolerating medications without difficulties and is otherwise without complaint today.   Past Medical History:  Diagnosis Date  . A-fib ([place])   . Chronic kidney disease   . Dementia   . Diabetes mellitus without complication ([place])   . Hypertension    Past Surgical History:  Procedure Laterality Date  . CHOLECYSTECTOMY      Current Outpatient Medications  Medication Sig Dispense Refill  . atorvastatin (LIPITOR) 20 MG tablet Take 1 tablet (20 mg total) by mouth daily at 6 PM. 30 tablet 0  . ezetimibe (ZETIA) 10 MG tablet Take 1 tablet (10 mg total) by mouth daily at 6 PM. 30 tablet 0  . lisinopril (PRINIVIL,ZESTRIL) 10 MG tablet Take 10 mg by mouth daily.     . metoprolol succinate (TOPROL-XL) 50 MG 24 hr tablet Take 1 tablet (50 mg total) by mouth daily. Take with or immediately following a meal. 30 tablet 0  . rivaroxaban (XARELTO) 20 MG TABS tablet Take 1 tablet (20 mg total) by mouth daily with supper. 7 tablet  0  . sertraline (ZOLOFT) 50 MG tablet Take 50 mg by mouth daily.    . SitaGLIPtin-MetFORMIN HCl (JANUMET XR) [id number] MG TB24 Take 1 tablet by mouth daily.    . vitamin B-12 (CYANOCOBALAMIN) 1000 MCG tablet Take 1,000 mcg by mouth daily.     No current facility-administered medications for this encounter.     No Known Allergies  Social History   Socioeconomic History  . Marital status: Married    Spouse name: Not on file  . Number of children: Not on file  . Years of education: Not on file  . Highest education level: Not on file  Occupational History  . Not on file  Social Needs  . Financial resource strain: Not on file  . Food insecurity:    Worry: Not on file    Inability: Not on file  . Transportation needs:    Medical: Not on file    Non-medical: Not on file  Tobacco Use  . Smoking status: Never Smoker  . Smokeless tobacco: Never Used  Substance and Sexual Activity  . Alcohol use: No  . Drug use: No  . Sexual activity: Not on file  Lifestyle  . Physical activity:    Days per week: Not on file    Minutes per session: Not on file  . Stress: Not on file  Relationships  . Social connections:  Talks on phone: Not on file    Gets together: Not on file    Attends religious service: Not on file    Active member of club or organization: Not on file    Attends meetings of clubs or organizations: Not on file    Relationship status: Not on file  . Intimate partner violence:    Fear of current or ex partner: Not on file    Emotionally abused: Not on file    Physically abused: Not on file    Forced sexual activity: Not on file  Other Topics Concern  . Not on file  Social History Narrative  . Not on file    Family History  Problem Relation Age of Onset  . Deep vein thrombosis Neg Hx     ROS- All systems are reviewed and negative except as per the HPI above  Physical Exam: Vitals:   07/18/18 0835  BP: 132/74  Pulse: 66  SpO2: 94%  Weight: 207 lb (93.9 kg)   Height: 5\' 4"  (1.626 m)   Wt Readings from Last 3 Encounters:  07/18/18 207 lb (93.9 kg)  07/11/18 205 lb (93 kg)  01/14/18 219 lb 3.2 oz (99.4 kg)    Labs: Lab Results  Component Value Date   NA 141 07/11/2018   K 4.7 07/11/2018   CL 109 07/11/2018   CO2 22 07/11/2018   GLUCOSE 193 (H) 07/11/2018   BUN 30 (H) 07/11/2018   CREATININE 1.53 (H) 07/11/2018   CALCIUM 9.1 07/11/2018   PHOS 4.6 01/20/2017   MG 1.6 (L) 07/11/2018   No results found for: INR No results found for: CHOL, HDL, LDLCALC, TRIG   GEN- The patient is well appearing, alert and oriented x 3 today.   Head- normocephalic, atraumatic Eyes-  Sclera clear, conjunctiva pink Ears- hearing intact Oropharynx- clear Neck- supple, no JVP Lymph- no cervical lymphadenopathy Lungs- Clear to ausculation bilaterally, normal work of breathing Heart- Regular rate and rhythm, no murmurs, rubs or gallops, PMI not laterally displaced GI- soft, NT, ND, + BS Extremities- no clubbing, cyanosis, or edema MS- no significant deformity or atrophy Skin- no rash or lesion Psych- euthymic mood, full affect Neuro- strength and sensation are intact  EKG-SR at 66 bpm Epic records reviewed ER records reviewed Mag was low at 1.6    Assessment and Plan: 1.Afluter, asymptomatic  Successful ER cardioversion I discussed with pt and son, ways to proceed Increase metoprolol, AAD therapy, wait and watch approach Pt wants to take the conservative route and not change anything at this time She is willing to add magnesium 200 mg OTC today as she has had low mags several times on past labs and may be a trigger She will check her HR by pulse ox daily and report if she has any elevated HR's Son was told if elevated HR at home, could take an extra 1/2 tab of BB to see if it would help break rhythm  She will continue xarelto 20 mg daily without interruption, chadsvasc score of at least 5  She missed her July  f/u with Dr. Stanford Breed and will  request 3 month f/u afib clinic as needed  Butch Penny C. Deena Shaub, [facility] [address] [place],  [address] [phone]

## 2018-07-18 NOTE — Telephone Encounter (Signed)
Called patient to schedule overdue appointment with Dr. Stanford Breed, but, she said her daughter will call back and schedule the appointment.

## 2018-07-18 NOTE — Patient Instructions (Signed)
Elevated heart rate over 100 (and top number of bp over 100) take an extra 1/2 tablet of metoprolol.  [name] in 3 months.  Start Magnesium 200mg  (over the counter) once a day

## 2018-07-25 NOTE — Progress Notes (Signed)
HPI: Follow-up atrial fibrillation/atrial flutter. Patient admitted January 2018 with acute pulmonary embolus and also noted to have atrial flutter. Lower extremity Dopplers also showed DVT.Amiodarone discontinued May 2018.  Last echocardiogram February 2019 showed normal LV function, grade 1 diastolic dysfunction and mild left atrial enlargement.  Patient underwent cardioversion of atrial flutter in the emergency room recently.  Seen in follow-up in atrial fibrillation clinic and conservative management felt indicated.  Since she was last seen  patient is somewhat confused.  She denies dyspnea, chest pain, palpitations, syncope, falling or bleeding.  Her daughter states that she has been out of rhythm recently with heart rate of 150.  She was given additional Toprol and converted back to sinus.  Current Outpatient Medications  Medication Sig Dispense Refill  . atorvastatin (LIPITOR) 20 MG tablet Take 1 tablet (20 mg total) by mouth daily at 6 PM. 30 tablet 0  . ezetimibe (ZETIA) 10 MG tablet Take 1 tablet (10 mg total) by mouth daily at 6 PM. 30 tablet 0  . lisinopril (PRINIVIL,ZESTRIL) 10 MG tablet Take 10 mg by mouth daily.     . metoprolol succinate (TOPROL-XL) 50 MG 24 hr tablet Take 1 tablet (50 mg total) by mouth daily. Take with or immediately following a meal. 30 tablet 0  . rivaroxaban (XARELTO) 20 MG TABS tablet Take 1 tablet (20 mg total) by mouth daily with supper. 7 tablet 0  . sertraline (ZOLOFT) 50 MG tablet Take 50 mg by mouth daily.    . SitaGLIPtin-MetFORMIN HCl (JANUMET XR) [phone] MG TB24 Take 1 tablet by mouth daily.    . vitamin B-12 (CYANOCOBALAMIN) 1000 MCG tablet Take 1,000 mcg by mouth daily.     No current facility-administered medications for this visit.      Past Medical History:  Diagnosis Date  . A-fib ([place])   . Chronic kidney disease   . Dementia   . Diabetes mellitus without complication ([place])   . Hypertension     Past Surgical History:    Procedure Laterality Date  . CHOLECYSTECTOMY      Social History   Socioeconomic History  . Marital status: Married    Spouse name: Not on file  . Number of children: Not on file  . Years of education: Not on file  . Highest education level: Not on file  Occupational History  . Not on file  Social Needs  . Financial resource strain: Not on file  . Food insecurity:    Worry: Not on file    Inability: Not on file  . Transportation needs:    Medical: Not on file    Non-medical: Not on file  Tobacco Use  . Smoking status: Never Smoker  . Smokeless tobacco: Never Used  Substance and Sexual Activity  . Alcohol use: No  . Drug use: No  . Sexual activity: Not on file  Lifestyle  . Physical activity:    Days per week: Not on file    Minutes per session: Not on file  . Stress: Not on file  Relationships  . Social connections:    Talks on phone: Not on file    Gets together: Not on file    Attends religious service: Not on file    Active member of club or organization: Not on file    Attends meetings of clubs or organizations: Not on file    Relationship status: Not on file  . Intimate partner violence:    Fear of  current or ex partner: Not on file    Emotionally abused: Not on file    Physically abused: Not on file    Forced sexual activity: Not on file  Other Topics Concern  . Not on file  Social History Narrative  . Not on file    Family History  Problem Relation Age of Onset  . Deep vein thrombosis Neg Hx     ROS: no fevers or chills, productive cough, hemoptysis, dysphasia, odynophagia, melena, hematochezia, dysuria, hematuria, rash, seizure activity, orthopnea, PND, pedal edema, claudication. Remaining systems are negative.  Physical Exam: Well-developed well-nourished in no acute distress.  Skin is warm and dry.  HEENT is normal.  Neck is supple.  Chest is clear to auscultation with normal expansion.  Cardiovascular exam is regular rate and rhythm.   Abdominal exam nontender or distended. No masses palpated. Extremities show no edema. neuro grossly intact  ECG-sinus rhythm at a rate of 64.  Cannot rule out prior inferior infarct.  Nonspecific ST changes.  Personally reviewed  A/P  1 paroxysmal atrial fibrillation/flutter-patient remains in sinus rhythm today.  We will continue Toprol for rate control if atrial fibrillation or flutter recurs.  However given recent episode with elevated heart rate I will change Toprol to 75 mg daily.  We can consider the addition of an antiarrhythmic in the future if she has recurrent symptomatic atrial fibrillation.  Continue Xarelto.  GFR is calculated at 41.  Will repeat creatinine today.  If GFR remains less than 50 we will decrease Xarelto to 15 mg daily.  2 history of pulmonary embolus-continue anticoagulation with Xarelto.  3 hypertension-blood pressure is controlled.  Continue present medications and follow.  4 history of diastolic congestive heart failure-she remains euvolemic off of all diuretics.  Previous CHF secondary to right heart strain/right heart failure at time of pulmonary embolus.  Kirk Ruths, MD

## 2018-07-29 ENCOUNTER — Encounter: Payer: Self-pay | Admitting: Cardiology

## 2018-07-29 ENCOUNTER — Ambulatory Visit (INDEPENDENT_AMBULATORY_CARE_PROVIDER_SITE_OTHER): Payer: Medicare Other | Admitting: Cardiology

## 2018-07-29 VITALS — BP 136/64 | HR 64 | Ht 64.0 in | Wt 204.0 lb

## 2018-07-29 DIAGNOSIS — I480 Paroxysmal atrial fibrillation: Secondary | ICD-10-CM

## 2018-07-29 DIAGNOSIS — I10 Essential (primary) hypertension: Secondary | ICD-10-CM | POA: Diagnosis not present

## 2018-07-29 MED ORDER — METOPROLOL SUCCINATE ER 25 MG PO TB24: 75.0000 mg | ORAL_TABLET | Freq: Every day | ORAL | 3 refills | Status: DC

## 2018-07-29 NOTE — Patient Instructions (Signed)
Medication Instructions:   INCREASE METOPROLOL TO 75 MG ONCE DAILY= 1 AND 1/2 OF THE 50 MG TABLETS ONCE DAILY= 3 OF THE 25 MG TABLETS ONCE DAILY  Labwork:  Your physician recommends that you HAVE LAB WORK TODAY  Follow-Up:  Your physician recommends that you schedule a follow-up appointment in: [place]   If you need a refill on your cardiac medications before your next appointment, please call your pharmacy.

## 2018-07-30 ENCOUNTER — Encounter: Payer: Self-pay | Admitting: *Deleted

## 2018-07-30 LAB — BASIC METABOLIC PANEL
BUN/Creatinine Ratio: 27 (ref 12–28)
BUN: 29 mg/dL — ABNORMAL HIGH (ref 8–27)
CO2: 21 mmol/L (ref 20–29)
Calcium: 9.8 mg/dL (ref 8.7–10.3)
Chloride: 103 mmol/L (ref 96–106)
Creatinine, Ser: 1.06 mg/dL — ABNORMAL HIGH (ref 0.57–1.00)
GFR calc Af Amer: 57 mL/min/{1.73_m2} — ABNORMAL LOW (ref >59)
GFR calc non Af Amer: 49 mL/min/{1.73_m2} — ABNORMAL LOW (ref >59)
Glucose: 157 mg/dL — ABNORMAL HIGH (ref 65–99)
Potassium: 4.7 mmol/L (ref 3.5–5.2)
Sodium: 140 mmol/L (ref 134–144)

## 2018-08-13 ENCOUNTER — Telehealth: Payer: Self-pay | Admitting: Cardiology

## 2018-08-13 NOTE — Telephone Encounter (Signed)
New Message     *STAT* If patient is at the pharmacy, call can be transferred to refill team.   1. Which medications need to be refilled? (please list name of each medication and dose if known) rivaroxaban (XARELTO) 20 MG TABS tablet  2. Which pharmacy/location (including street and city if local pharmacy) is medication to be sent to?[facility] [place], [place]   3. Do they need a 30 day or 90 day supply? 90 day supply

## 2018-08-14 MED ORDER — RIVAROXABAN 20 MG PO TABS: 20.0000 mg | ORAL_TABLET | Freq: Every day | ORAL | 1 refills | Status: DC

## 2018-08-20 ENCOUNTER — Telehealth: Payer: Self-pay | Admitting: Cardiology

## 2018-08-20 NOTE — Telephone Encounter (Signed)
New message:      Pt c/o medication issue:  1. Name of Medication: metoprolol succinate (TOPROL-XL) 25 MG 24 hr tablet  2. How are you currently taking this medication (dosage and times per day)? Take 3 tablets (75 mg total) by mouth daily. Take with or immediately following a meal.  3. Are you having a reaction (difficulty breathing--STAT)? No  4. What is your medication issue? Pt's daughter states since the Dr. Increased this medication she has had some low BP of 91/52 and 96/58. She states she feels as if the pt may need to come off of the lisinopril too. She states the pt is having a low heart rate too.

## 2018-08-20 NOTE — Telephone Encounter (Signed)
Spoke with pt's daughter [name]. The pt is having some confusion and paranoia, she is schedule to see a psychiatrist next week.  [name] called to report the pt's BP and HR since Dr.[name] increased Metoprolol to 75mg  daily.  08/13/18   91/52 HR 68bpm, 96/58 HR 57bpm 08/19/18 106/54  57bpm 08/20/18 122/57 53bpm  Adv [name] that a heartrate in the 50's could be expected for someone who is on Metoprolol. Asked if the pt was having any symptoms.  [name] sts that the pt is having some confusion, the pt says she is doing fine but it hard to tell because if her confusion.  [name] was wondering if the pt's Lisinopril 10mg  daily should be reduced. Lisinopril is prescribed by her pcp.  [name] that I will send an update to [facility] and we will give her a call if he has any recommendations.  [name] is agreeable with plan and verbalized understanding.

## 2018-08-21 MED ORDER — LISINOPRIL 2.5 MG PO TABS: 10.0000 mg | ORAL_TABLET | Freq: Every day | ORAL | 1 refills | Status: DC

## 2018-08-21 NOTE — Telephone Encounter (Signed)
Called pt's daughter [name] to give her Dr.[name]'s recommendation. lmtcb.

## 2018-08-21 NOTE — Telephone Encounter (Signed)
Change lisinopril to 2.5 mg daily and follow BP Kirk Ruths

## 2018-08-21 NOTE — Telephone Encounter (Signed)
Spoke with patient's daughter Catherine Gonzales.  Catherine Gonzales is aware of Dr.[name]'s recommendation    Stanford Breed [name], MD       7:21 AM  Note    Change lisinopril to 2.5 mg daily and follow BP Kirk Ruths     Rx sent to pt's local pharmacy. They will continue to monitor the patient's BP at home. Catherine Gonzales will call in a couple of weeks to give an update and will rqst a refill be sent to the pt's mail order if she is doing well.

## 2018-08-21 NOTE — Telephone Encounter (Signed)
2nd attempt to reach the pt's daughter [name]. lmtcb on both telephone numbers listed.

## 2018-08-27 DIAGNOSIS — F3341 Major depressive disorder, recurrent, in partial remission: Secondary | ICD-10-CM | POA: Diagnosis not present

## 2018-08-31 ENCOUNTER — Other Ambulatory Visit: Payer: Self-pay | Admitting: Cardiology

## 2018-10-15 ENCOUNTER — Telehealth: Payer: Self-pay | Admitting: Cardiology

## 2018-10-15 NOTE — Telephone Encounter (Signed)
Spoke with pt daughter Catherine Gonzales. Several weeks ago Dr.Crensahw reduced Lisinopril to 2.5mg  daily due to low BP. [name] sts that the pt BP on Fri was 75/47, recheck 95/41. Catherine Gonzales sts that she held Lisinopril on Saturday. On Saturday pt BP was 123/66 in the am and 108/59 in the pm. Pt has not received Lisinopril since Friday BP readings: Sun 10/27 107/66 Mon 10/28 128/74. Pt take Metoprolol in the evenings her HR has ranged from 57-59. Pt is having cognitive decline and Catherine Gonzales is concerned that the pt would not be able to report symptoms like dizziness. [name] that I will fwd an update to Dr.[name], Catherine Gonzales will be faxing additional BP readings to Dr.[name]'s attn. [name] that we will give her a call back with his recommendation.

## 2018-10-15 NOTE — Telephone Encounter (Signed)
New Message  Pt c/o medication issue:  1. Name of Medication: lisinopril (PRINIVIL,ZESTRIL) 2.5 MG tablet  2. How are you currently taking this medication (dosage and times per day)? Take 4 tablets (10 mg total) by mouth daily  3. Are you having a reaction (difficulty breathing--STAT)? no  4. What is your medication issue? Pt's daughter states that the pt is experiencing some low blood pressures and she held her medication on sat, sun and today. Please call

## 2018-10-15 NOTE — Telephone Encounter (Signed)
Spoke with pt dtr, Aware of dr crenshaw's recommendations.  

## 2018-10-15 NOTE — Telephone Encounter (Signed)
Discontinue lisinopril and follow blood pressure. Kirk Ruths, MD

## 2018-10-21 NOTE — Progress Notes (Deleted)
HPI: Follow-upatrial fibrillation/atrial flutter. Patient admitted January 2018 with acute pulmonary embolus and also noted to have atrial flutter. Lower extremity Dopplers also showed DVT.Amiodarone discontinued May 2018.  Last echocardiogram February 2019 showed normal LV function, grade 1 diastolic dysfunction and mild left atrial enlargement.  Patient underwent cardioversion of atrial flutter in the emergency room recently.  Seen in follow-up in atrial fibrillation clinic and conservative management felt indicated.  Since she waslast seen   Current Outpatient Medications  Medication Sig Dispense Refill  . atorvastatin (LIPITOR) 20 MG tablet Take 1 tablet (20 mg total) by mouth daily at 6 PM. 30 tablet 0  . ezetimibe (ZETIA) 10 MG tablet Take 1 tablet (10 mg total) by mouth daily at 6 PM. 30 tablet 0  . metoprolol succinate (TOPROL-XL) 25 MG 24 hr tablet Take 3 tablets (75 mg total) by mouth daily. Take with or immediately following a meal. 270 tablet 3  . sertraline (ZOLOFT) 50 MG tablet Take 50 mg by mouth daily.    . SitaGLIPtin-MetFORMIN HCl (JANUMET XR) [phone] MG TB24 Take 1 tablet by mouth daily.    . vitamin B-12 (CYANOCOBALAMIN) 1000 MCG tablet Take 1,000 mcg by mouth daily.    Alveda Reasons 20 MG TABS tablet TAKE 1 TABLET DAILY WITH SUPPER 90 tablet 1   No current facility-administered medications for this visit.      Past Medical History:  Diagnosis Date  . A-fib ([place])   . Chronic kidney disease   . Dementia   . Diabetes mellitus without complication ([place])   . Hypertension     Past Surgical History:  Procedure Laterality Date  . CHOLECYSTECTOMY      Social History   Socioeconomic History  . Marital status: Married    Spouse name: Not on file  . Number of children: Not on file  . Years of education: Not on file  . Highest education level: Not on file  Occupational History  . Not on file  Social Needs  . Financial resource strain: Not on file  . Food  insecurity:    Worry: Not on file    Inability: Not on file  . Transportation needs:    Medical: Not on file    Non-medical: Not on file  Tobacco Use  . Smoking status: Never Smoker  . Smokeless tobacco: Never Used  Substance and Sexual Activity  . Alcohol use: No  . Drug use: No  . Sexual activity: Not on file  Lifestyle  . Physical activity:    Days per week: Not on file    Minutes per session: Not on file  . Stress: Not on file  Relationships  . Social connections:    Talks on phone: Not on file    Gets together: Not on file    Attends religious service: Not on file    Active member of club or organization: Not on file    Attends meetings of clubs or organizations: Not on file    Relationship status: Not on file  . Intimate partner violence:    Fear of current or ex partner: Not on file    Emotionally abused: Not on file    Physically abused: Not on file    Forced sexual activity: Not on file  Other Topics Concern  . Not on file  Social History Narrative  . Not on file    Family History  Problem Relation Age of Onset  . Deep vein thrombosis Neg Hx  ROS: no fevers or chills, productive cough, hemoptysis, dysphasia, odynophagia, melena, hematochezia, dysuria, hematuria, rash, seizure activity, orthopnea, PND, pedal edema, claudication. Remaining systems are negative.  Physical Exam: Well-developed well-nourished in no acute distress.  Skin is warm and dry.  HEENT is normal.  Neck is supple.  Chest is clear to auscultation with normal expansion.  Cardiovascular exam is regular rate and rhythm.  Abdominal exam nontender or distended. No masses palpated. Extremities show no edema. neuro grossly intact  ECG- personally reviewed  A/P  1  Kirk Ruths, MD

## 2018-10-29 ENCOUNTER — Ambulatory Visit (INDEPENDENT_AMBULATORY_CARE_PROVIDER_SITE_OTHER): Payer: Medicare Other | Admitting: Cardiology

## 2018-10-29 ENCOUNTER — Encounter: Payer: Self-pay | Admitting: Cardiology

## 2018-10-29 VITALS — BP 140/76 | HR 76 | Ht 64.0 in | Wt 198.0 lb

## 2018-10-29 DIAGNOSIS — I480 Paroxysmal atrial fibrillation: Secondary | ICD-10-CM

## 2018-10-29 DIAGNOSIS — I10 Essential (primary) hypertension: Secondary | ICD-10-CM | POA: Diagnosis not present

## 2018-10-29 DIAGNOSIS — I5032 Chronic diastolic (congestive) heart failure: Secondary | ICD-10-CM

## 2018-10-29 NOTE — Patient Instructions (Addendum)
Medication Instructions:  Your physician recommends that you continue on your current medications as directed. Please refer to the Current Medication list given to you today.  If you need a refill on your cardiac medications before your next appointment, please call your pharmacy.   Lab work: None ordered If you have labs (blood work) drawn today and your tests are completely normal, you will receive your results only by: Marland Kitchen MyChart Message (if you have MyChart) OR . A paper copy in the mail If you have any lab test that is abnormal or we need to change your treatment, we will call you to review the results.  Testing/Procedures: None ordered  Follow-Up: At [facility] you and your health needs are our priority.  As part of our continuing mission to provide you with exceptional heart care, we have created designated Provider Care Teams.  These Care Teams include your primary Cardiologist (physician) and Advanced Practice Providers (APPs -  Physician Assistants and Nurse Practitioners) who all work together to provide you with the care you need, when you need it. You will need a follow up appointment in 6 months.  Please call our office 2 months in advance to schedule this appointment.  You may see Kirk Ruths, MD or one of the following Advanced Practice Providers on your designated Care Team:   Kerin Ransom, PA-C Roby Lofts, [place] . Sande Rives, PA-C  Any Other Special Instructions Will Be Listed Below (If Applicable).

## 2018-10-29 NOTE — Progress Notes (Signed)
HPI: Follow-upatrial fibrillation/atrial flutter. Patient admitted January 2018 with acute pulmonary embolus and also noted to have atrial flutter. Lower extremity Dopplers also showed DVT.Amiodarone discontinued May 2018.  Last echocardiogram February 2019 showed normal LV function, grade 1 diastolic dysfunction and mild left atrial enlargement.  Patient underwent cardioversion of atrial flutter in the emergency room recently.  Seen in follow-up in atrial fibrillation clinic and conservative management felt indicated.    At last office visit metoprolol increased.  She developed problems with hypotension and lisinopril discontinued.  Since she waslast seenpatient denies dyspnea, chest pain, palpitations, syncope or bleeding.  She continues to have significant problems with dementia.  Current Outpatient Medications  Medication Sig Dispense Refill  . atorvastatin (LIPITOR) 20 MG tablet Take 1 tablet (20 mg total) by mouth daily at 6 PM. 30 tablet 0  . ezetimibe (ZETIA) 10 MG tablet Take 1 tablet (10 mg total) by mouth daily at 6 PM. 30 tablet 0  . memantine (NAMENDA) 10 MG tablet Take 10 mg by mouth 2 (two) times daily.    . metoprolol succinate (TOPROL-XL) 25 MG 24 hr tablet Take 3 tablets (75 mg total) by mouth daily. Take with or immediately following a meal. 270 tablet 3  . sertraline (ZOLOFT) 50 MG tablet Take 50 mg by mouth daily.    . SitaGLIPtin-MetFORMIN HCl (JANUMET XR) [phone] MG TB24 Take 1 tablet by mouth daily.    . vitamin B-12 (CYANOCOBALAMIN) 1000 MCG tablet Take 1,000 mcg by mouth daily.    Alveda Reasons 20 MG TABS tablet TAKE 1 TABLET DAILY WITH SUPPER 90 tablet 1   No current facility-administered medications for this visit.      Past Medical History:  Diagnosis Date  . A-fib ([place])   . Chronic kidney disease   . Dementia ([place])   . Diabetes mellitus without complication ([place])   . Hypertension     Past Surgical History:  Procedure Laterality Date  .  CHOLECYSTECTOMY      Social History   Socioeconomic History  . Marital status: Married    Spouse name: Not on file  . Number of children: Not on file  . Years of education: Not on file  . Highest education level: Not on file  Occupational History  . Not on file  Social Needs  . Financial resource strain: Not on file  . Food insecurity:    Worry: Not on file    Inability: Not on file  . Transportation needs:    Medical: Not on file    Non-medical: Not on file  Tobacco Use  . Smoking status: Never Smoker  . Smokeless tobacco: Never Used  Substance and Sexual Activity  . Alcohol use: No  . Drug use: No  . Sexual activity: Not on file  Lifestyle  . Physical activity:    Days per week: Not on file    Minutes per session: Not on file  . Stress: Not on file  Relationships  . Social connections:    Talks on phone: Not on file    Gets together: Not on file    Attends religious service: Not on file    Active member of club or organization: Not on file    Attends meetings of clubs or organizations: Not on file    Relationship status: Not on file  . Intimate partner violence:    Fear of current or ex partner: Not on file    Emotionally abused: Not on file  Physically abused: Not on file    Forced sexual activity: Not on file  Other Topics Concern  . Not on file  Social History Narrative  . Not on file    Family History  Problem Relation Age of Onset  . Deep vein thrombosis Neg Hx     ROS: no fevers or chills, productive cough, hemoptysis, dysphasia, odynophagia, melena, hematochezia, dysuria, hematuria, rash, seizure activity, orthopnea, PND, pedal edema, claudication. Remaining systems are negative.  Physical Exam: Well-developed well-nourished in no acute distress.  Skin is warm and dry.  HEENT is normal.  Neck is supple.  Chest is clear to auscultation with normal expansion.  Cardiovascular exam is regular rate and rhythm.  Abdominal exam nontender or  distended. No masses palpated. Extremities show no edema. neuro grossly intact  A/P  1 paroxysmal atrial fibrillation/flutter-patient is in sinus rhythm on examination.  We will continue with Toprol.  Continue Xarelto.  2 history of pulmonary embolus-continue Xarelto.  3 hypertension-blood pressure recently low but lisinopril was discontinued.  Blood pressure now improved.  Continue present medications and follow.  4 diastolic congestive heart failure-patient appears to be euvolemic.  Continue fluid restriction and low-sodium diet.  Kirk Ruths, MD

## 2018-10-30 ENCOUNTER — Ambulatory Visit: Payer: Medicare Other | Admitting: Cardiology

## 2018-11-01 DIAGNOSIS — F3341 Major depressive disorder, recurrent, in partial remission: Secondary | ICD-10-CM | POA: Diagnosis not present

## 2018-11-04 DIAGNOSIS — E782 Mixed hyperlipidemia: Secondary | ICD-10-CM | POA: Diagnosis not present

## 2018-11-04 DIAGNOSIS — I2782 Chronic pulmonary embolism: Secondary | ICD-10-CM | POA: Diagnosis not present

## 2018-11-04 DIAGNOSIS — Z7984 Long term (current) use of oral hypoglycemic drugs: Secondary | ICD-10-CM | POA: Diagnosis not present

## 2018-11-04 DIAGNOSIS — E538 Deficiency of other specified B group vitamins: Secondary | ICD-10-CM | POA: Diagnosis not present

## 2018-11-04 DIAGNOSIS — I2721 Secondary pulmonary arterial hypertension: Secondary | ICD-10-CM | POA: Diagnosis not present

## 2018-11-04 DIAGNOSIS — N183 Chronic kidney disease, stage 3 (moderate): Secondary | ICD-10-CM | POA: Diagnosis not present

## 2018-11-04 DIAGNOSIS — F29 Unspecified psychosis not due to a substance or known physiological condition: Secondary | ICD-10-CM | POA: Diagnosis not present

## 2018-11-04 DIAGNOSIS — E1122 Type 2 diabetes mellitus with diabetic chronic kidney disease: Secondary | ICD-10-CM | POA: Diagnosis not present

## 2018-11-04 DIAGNOSIS — I129 Hypertensive chronic kidney disease with stage 1 through stage 4 chronic kidney disease, or unspecified chronic kidney disease: Secondary | ICD-10-CM | POA: Diagnosis not present

## 2018-11-04 DIAGNOSIS — F0390 Unspecified dementia without behavioral disturbance: Secondary | ICD-10-CM | POA: Diagnosis not present

## 2018-11-04 DIAGNOSIS — F322 Major depressive disorder, single episode, severe without psychotic features: Secondary | ICD-10-CM | POA: Diagnosis not present

## 2018-11-04 DIAGNOSIS — I4892 Unspecified atrial flutter: Secondary | ICD-10-CM | POA: Diagnosis not present

## 2019-02-07 ENCOUNTER — Other Ambulatory Visit: Payer: Self-pay | Admitting: Cardiology

## 2019-02-07 NOTE — Telephone Encounter (Signed)
Rx has been sent to the pharmacy electronically. ° °

## 2019-02-10 DIAGNOSIS — E1122 Type 2 diabetes mellitus with diabetic chronic kidney disease: Secondary | ICD-10-CM | POA: Diagnosis not present

## 2019-02-10 DIAGNOSIS — F322 Major depressive disorder, single episode, severe without psychotic features: Secondary | ICD-10-CM | POA: Diagnosis not present

## 2019-02-10 DIAGNOSIS — F29 Unspecified psychosis not due to a substance or known physiological condition: Secondary | ICD-10-CM | POA: Diagnosis not present

## 2019-02-10 DIAGNOSIS — Z7984 Long term (current) use of oral hypoglycemic drugs: Secondary | ICD-10-CM | POA: Diagnosis not present

## 2019-02-10 DIAGNOSIS — E782 Mixed hyperlipidemia: Secondary | ICD-10-CM | POA: Diagnosis not present

## 2019-03-01 ENCOUNTER — Telehealth: Payer: Self-pay | Admitting: Cardiology

## 2019-03-01 NOTE — Telephone Encounter (Signed)
I returned a page to the patient's daughter. She reports that the patient was found to have HR in the 140s. She was given an extra dose of metoprolol which did not help with her HR. She was given an additional dose of metoprolol 2 hours ago, but HR are still in the 140s. O2 saturations in mid 90s. BP has been wnl. She has no chest pain or dyspnea or other symptoms.   She has had a total of 75 mg of metoprolol today. I encouraged her daughter to give her an additional 25 mg of metoprolol now. They will continue to trend her HR and let us know if it does not improve. She was urged to go to the ED with any new or severe symptoms.

## 2019-03-02 ENCOUNTER — Other Ambulatory Visit: Payer: Self-pay

## 2019-03-02 ENCOUNTER — Emergency Department (HOSPITAL_BASED_OUTPATIENT_CLINIC_OR_DEPARTMENT_OTHER)
Admission: EM | Admit: 2019-03-02 | Discharge: 2019-03-02 | Disposition: A | Discharge status: Home / Self Care | Payer: Medicare Other | Attending: Emergency Medicine | Admitting: Emergency Medicine

## 2019-03-02 ENCOUNTER — Telehealth: Payer: Self-pay | Admitting: Cardiology

## 2019-03-02 ENCOUNTER — Encounter (HOSPITAL_BASED_OUTPATIENT_CLINIC_OR_DEPARTMENT_OTHER): Payer: Self-pay | Admitting: Emergency Medicine

## 2019-03-02 DIAGNOSIS — I4891 Unspecified atrial fibrillation: Secondary | ICD-10-CM | POA: Diagnosis not present

## 2019-03-02 DIAGNOSIS — F0390 Unspecified dementia without behavioral disturbance: Secondary | ICD-10-CM | POA: Diagnosis not present

## 2019-03-02 DIAGNOSIS — N189 Chronic kidney disease, unspecified: Secondary | ICD-10-CM | POA: Diagnosis not present

## 2019-03-02 DIAGNOSIS — E1122 Type 2 diabetes mellitus with diabetic chronic kidney disease: Secondary | ICD-10-CM | POA: Insufficient documentation

## 2019-03-02 DIAGNOSIS — Z79899 Other long term (current) drug therapy: Secondary | ICD-10-CM | POA: Insufficient documentation

## 2019-03-02 DIAGNOSIS — Z7984 Long term (current) use of oral hypoglycemic drugs: Secondary | ICD-10-CM | POA: Insufficient documentation

## 2019-03-02 DIAGNOSIS — Z7901 Long term (current) use of anticoagulants: Secondary | ICD-10-CM | POA: Diagnosis not present

## 2019-03-02 DIAGNOSIS — I129 Hypertensive chronic kidney disease with stage 1 through stage 4 chronic kidney disease, or unspecified chronic kidney disease: Secondary | ICD-10-CM | POA: Diagnosis not present

## 2019-03-02 DIAGNOSIS — R002 Palpitations: Secondary | ICD-10-CM | POA: Diagnosis present

## 2019-03-02 LAB — BASIC METABOLIC PANEL
ANION GAP: 9 (ref 5–15)
BUN: 20 mg/dL (ref 8–23)
CALCIUM: 8.8 mg/dL — AB (ref 8.9–10.3)
CO2: 20 mmol/L — AB (ref 22–32)
CREATININE: 1.17 mg/dL — AB (ref 0.44–1.00)
Chloride: 106 mmol/L (ref 98–111)
GFR, EST AFRICAN AMERICAN: 50 mL/min — AB (ref >60)
GFR, EST NON AFRICAN AMERICAN: 43 mL/min — AB (ref >60)
Glucose, Bld: 263 mg/dL — ABNORMAL HIGH (ref 70–99)
Potassium: 3.6 mmol/L (ref 3.5–5.1)
SODIUM: 135 mmol/L (ref 135–145)

## 2019-03-02 LAB — CBC
HEMATOCRIT: 45.4 % (ref 36.0–46.0)
Hemoglobin: 14.3 g/dL (ref 12.0–15.0)
MCH: 26.6 pg (ref 26.0–34.0)
MCHC: 31.5 g/dL (ref 30.0–36.0)
MCV: 84.5 fL (ref 80.0–100.0)
NRBC: 0 % (ref 0.0–0.2)
PLATELETS: 225 10*3/uL (ref 150–400)
RBC: 5.37 MIL/uL — ABNORMAL HIGH (ref 3.87–5.11)
RDW: 15.7 % — AB (ref 11.5–15.5)
WBC: 8.3 10*3/uL (ref 4.0–10.5)

## 2019-03-02 NOTE — Telephone Encounter (Signed)
Spoke with daughter regarding pt's HR. She called multiple times last night and spoke with the fellow on call who suggested taking an additional 25-50mg  Toprol for sustained HR in the 140's. He suggested that if this did not help and her HR remained elevated today, she would need to be seen in the ED. Daughter reports that the patients BP is stable, but HR continues to be in the 150 range. She states that they will go to the [place] ED in hopes of DCCV and understand that they may need to be transported to [facility]. They understand and agree with plan.     Kathyrn Drown NP-C [place] Pager: [phone]

## 2019-03-02 NOTE — ED Provider Notes (Signed)
Catherine Gonzales Provider Note   CSN: [id number] Arrival date & time: 03/02/19  1118    History   Chief Complaint Chief Complaint  Patient presents with  . Palpitations    HPI Catherine Gonzales is a 85 y.o. female.     Patient with hx afib, presents with rapid heart rate up to 150 yesterday. Symptoms acute onset, episodic, recurrent. Patient indicates she cant tell when hr is fast, but they had a monitor. Patient denies any current or recent chest pain or discomfort. No sob. No syncope or fall. Pt indicates compliant w normal meds, no recent change. Is on anticoag therapy. No recent blood loss. Patients family had called cardiologist who recommended she taken an extra dose of her toprol, which she did. Pt indicates now is feeling fine.   The history is provided by the patient.  Palpitations  Associated symptoms: no back pain, no chest pain, no cough, no shortness of breath and no vomiting     Past Medical History:  Diagnosis Date  . A-fib ([place])   . Chronic kidney disease   . Dementia ([place])   . Diabetes mellitus without complication ([place])   . Hypertension     Patient Active Problem List   Diagnosis Date Noted  . DVT of lower extremity, bilateral ([place]): peroneal veins 01/15/2017  . Memory deficit 01/14/2017  . Hypertension 01/14/2017  . Diabetes mellitus type II, non insulin dependent ([place]) 01/14/2017  . Atrial flutter ([place]) 01/14/2017  . Pulmonary embolus ([place]) 01/13/2017  . Kidney disease 01/13/2017    Past Surgical History:  Procedure Laterality Date  . CHOLECYSTECTOMY       OB History   No obstetric history on file.      Home Medications    Prior to Admission medications   Medication Sig Start Date End Date Taking? Authorizing Provider  atorvastatin (LIPITOR) 20 MG tablet Take 1 tablet (20 mg total) by mouth daily at 6 PM. 01/20/17   [name], MD  ezetimibe (ZETIA) 10 MG tablet Take 1 tablet (10 mg total) by mouth daily  at 6 PM. 01/20/17   [name], MD  memantine (NAMENDA) 10 MG tablet Take 10 mg by mouth 2 (two) times daily.    [provider]  metoprolol succinate (TOPROL-XL) 25 MG 24 hr tablet Take 3 tablets (75 mg total) by mouth daily. Take with or immediately following a meal. 07/29/18   [name], [name], MD  sertraline (ZOLOFT) 50 MG tablet Take 50 mg by mouth daily. 07/10/18   [provider]  SitaGLIPtin-MetFORMIN HCl (JANUMET XR) [phone] MG TB24 Take 1 tablet by mouth daily.    [provider]  vitamin B-12 (CYANOCOBALAMIN) 1000 MCG tablet Take 1,000 mcg by mouth daily.    [provider]  XARELTO 20 MG TABS tablet TAKE 1 TABLET DAILY WITH SUPPER 02/07/19   Lelon Perla, MD    Family History Family History  Problem Relation Age of Onset  . Deep vein thrombosis Neg Hx     Social History Social History   Tobacco Use  . Smoking status: Never Smoker  . Smokeless tobacco: Never Used  Substance Use Topics  . Alcohol use: No  . Drug use: No     Allergies   Patient has no known allergies.   Review of Systems Review of Systems  Constitutional: Negative for fever.  HENT: Negative for sore throat.   Eyes: Negative for redness.  Respiratory: Negative for cough  and shortness of breath.   Cardiovascular: Positive for palpitations. Negative for chest pain.  Gastrointestinal: Negative for abdominal pain, blood in stool, diarrhea and vomiting.  Genitourinary: Negative for flank pain.  Musculoskeletal: Negative for back pain and neck pain.  Skin: Negative for rash.  Neurological: Negative for syncope and headaches.  Hematological: Does not bruise/bleed easily.  Psychiatric/Behavioral: Negative for confusion.     Physical Exam Updated Vital Signs BP 122/72 (BP Location: Right Arm)   Pulse 70   Temp 97.9 F (36.6 C)   Resp 20   Ht 1.626 m (5\' 4" )   Wt 91.6 kg   SpO2 96%   BMI 34.67 kg/m   Physical Exam Vitals signs and nursing note  reviewed.  Constitutional:      Appearance: Normal appearance. She is well-developed.  HENT:     Head: Atraumatic.     Nose: Nose normal.     Mouth/Throat:     Mouth: Mucous membranes are moist.  Eyes:     General: No scleral icterus.    Conjunctiva/sclera: Conjunctivae normal.     Pupils: Pupils are equal, round, and reactive to light.  Neck:     Musculoskeletal: Normal range of motion and neck supple. No neck rigidity or muscular tenderness.     Trachea: No tracheal deviation.  Cardiovascular:     Rate and Rhythm: Normal rate. Rhythm irregular.     Pulses: Normal pulses.     Heart sounds: Normal heart sounds. No murmur. No friction rub. No gallop.   Pulmonary:     Effort: Pulmonary effort is normal. No respiratory distress.     Breath sounds: Normal breath sounds.  Abdominal:     General: Bowel sounds are normal. There is no distension.     Palpations: Abdomen is soft.     Tenderness: There is no abdominal tenderness. There is no guarding.  Genitourinary:    Comments: No cva tenderness.  Musculoskeletal:        General: No swelling.     Right lower leg: No edema.     Left lower leg: No edema.  Skin:    General: Skin is warm and dry.     Findings: No rash.  Neurological:     Mental Status: She is alert.     Comments: Alert, speech normal.   Psychiatric:        Mood and Affect: Mood normal.      ED Treatments / Results  Labs (all labs ordered are listed, but only abnormal results are displayed) Results for orders placed or performed during the hospital encounter of 03/02/19  CBC  Result Value Ref Range   WBC 8.3 4.0 - 10.5 K/uL   RBC 5.37 (H) 3.87 - 5.11 MIL/uL   Hemoglobin 14.3 12.0 - 15.0 g/dL   HCT 45.4 36.0 - 46.0 %   MCV 84.5 80.0 - 100.0 fL   MCH 26.6 26.0 - 34.0 pg   MCHC 31.5 30.0 - 36.0 g/dL   RDW 15.7 (H) 11.5 - 15.5 %   Platelets 225 150 - 400 K/uL   nRBC 0.0 0.0 - 0.2 %  Basic metabolic panel  Result Value Ref Range   Sodium 135 135 - 145  mmol/L   Potassium 3.6 3.5 - 5.1 mmol/L   Chloride 106 98 - 111 mmol/L   CO2 20 (L) 22 - 32 mmol/L   Glucose, Bld 263 (H) 70 - 99 mg/dL   BUN 20 8 - 23 mg/dL   Creatinine,  Ser 1.17 (H) 0.44 - 1.00 mg/dL   Calcium 8.8 (L) 8.9 - 10.3 mg/dL   GFR calc non Af Amer 43 (L) >60 mL/min   GFR calc Af Amer 50 (L) >60 mL/min   Anion gap 9 5 - 15    ED ECG REPORT - epic ecg not crossing over   Date: 03/02/2019  Rate: 75  Rhythm: normal sinus rhythm  QRS Axis: normal  Intervals: normal  ST/T Wave abnormalities: nonspecific ST/T changes  Conduction Disutrbances:none  Narrative Interpretation:   Old EKG Reviewed: unchanged  I have personally reviewed the EKG tracing. No acute change from ecg 07/2018.  Radiology No results found.  Procedures Procedures (including critical care time)  Medications Ordered in ED Medications - No data to display   Initial Impression / Assessment and Plan / ED Course  I have reviewed the triage vital signs and the nursing notes.  Pertinent labs & imaging results that were available during my care of the patient were reviewed by me and considered in my medical decision making (see chart for details).  Iv ns. Ecg. Continuous pulse ox and monitor. Labs sent.   Reviewed nursing notes and prior charts for additional history.   Labs reviewed - chem normal.  On monitor, remains in sinus rhythm, rate is controlled. No chest pain, sob or faintness.  Patient currently appears stable for d/c.     Final Clinical Impressions(s) / ED Diagnoses   Final diagnoses:  None    ED Discharge Orders    None       Lajean Saver, MD 03/02/19 1219

## 2019-03-02 NOTE — ED Triage Notes (Signed)
Pt family states they check her HR daily and noted it to be in the 150's yesterday and this morning. Pt denies chest pain, SOB and states she does not feel like her heart is beating too fast. Family reports she has had some SOB with exertion.

## 2019-03-02 NOTE — Discharge Instructions (Addendum)
It was our pleasure to provide your ER care today - we hope that you feel better.  Follow up with your cardiologist in the coming week.  Return to ER if worse, new symptoms, persistent fast heart beat, increased trouble breathing, weak/fainting, other concern.

## 2019-04-15 DIAGNOSIS — R197 Diarrhea, unspecified: Secondary | ICD-10-CM | POA: Diagnosis not present

## 2019-04-15 DIAGNOSIS — Z7984 Long term (current) use of oral hypoglycemic drugs: Secondary | ICD-10-CM | POA: Diagnosis not present

## 2019-04-15 DIAGNOSIS — Z6835 Body mass index (BMI) 35.0-35.9, adult: Secondary | ICD-10-CM | POA: Diagnosis not present

## 2019-04-15 DIAGNOSIS — F322 Major depressive disorder, single episode, severe without psychotic features: Secondary | ICD-10-CM | POA: Diagnosis not present

## 2019-04-15 DIAGNOSIS — E1122 Type 2 diabetes mellitus with diabetic chronic kidney disease: Secondary | ICD-10-CM | POA: Diagnosis not present

## 2019-04-15 DIAGNOSIS — F0390 Unspecified dementia without behavioral disturbance: Secondary | ICD-10-CM | POA: Diagnosis not present

## 2019-05-08 ENCOUNTER — Other Ambulatory Visit: Payer: Self-pay

## 2019-05-08 ENCOUNTER — Ambulatory Visit (INDEPENDENT_AMBULATORY_CARE_PROVIDER_SITE_OTHER): Payer: Medicare Other | Admitting: Podiatry

## 2019-05-08 VITALS — Temp 97.7°F

## 2019-05-08 DIAGNOSIS — B351 Tinea unguium: Secondary | ICD-10-CM

## 2019-05-08 DIAGNOSIS — E119 Type 2 diabetes mellitus without complications: Secondary | ICD-10-CM | POA: Diagnosis not present

## 2019-05-08 DIAGNOSIS — I129 Hypertensive chronic kidney disease with stage 1 through stage 4 chronic kidney disease, or unspecified chronic kidney disease: Secondary | ICD-10-CM | POA: Diagnosis not present

## 2019-05-08 DIAGNOSIS — N183 Chronic kidney disease, stage 3 (moderate): Secondary | ICD-10-CM | POA: Diagnosis not present

## 2019-05-08 DIAGNOSIS — E1122 Type 2 diabetes mellitus with diabetic chronic kidney disease: Secondary | ICD-10-CM | POA: Diagnosis not present

## 2019-05-08 DIAGNOSIS — F0390 Unspecified dementia without behavioral disturbance: Secondary | ICD-10-CM | POA: Diagnosis not present

## 2019-05-08 DIAGNOSIS — Z7984 Long term (current) use of oral hypoglycemic drugs: Secondary | ICD-10-CM | POA: Diagnosis not present

## 2019-05-08 DIAGNOSIS — M79609 Pain in unspecified limb: Secondary | ICD-10-CM | POA: Diagnosis not present

## 2019-05-08 DIAGNOSIS — F29 Unspecified psychosis not due to a substance or known physiological condition: Secondary | ICD-10-CM | POA: Diagnosis not present

## 2019-05-08 DIAGNOSIS — L821 Other seborrheic keratosis: Secondary | ICD-10-CM | POA: Diagnosis not present

## 2019-05-08 DIAGNOSIS — E538 Deficiency of other specified B group vitamins: Secondary | ICD-10-CM | POA: Diagnosis not present

## 2019-05-08 DIAGNOSIS — F322 Major depressive disorder, single episode, severe without psychotic features: Secondary | ICD-10-CM | POA: Diagnosis not present

## 2019-05-08 DIAGNOSIS — E782 Mixed hyperlipidemia: Secondary | ICD-10-CM | POA: Diagnosis not present

## 2019-05-08 NOTE — Progress Notes (Signed)
Subjective:  Patient ID: Catherine Gonzales, female    DOB: 07-Jul-1934,  MRN: [id number]  Chief Complaint  Patient presents with  . Nail Problem    Pt states bilateral 1-5 nail fungus, right 1st nail has fallen off. Pt states needs trim. 38yr duration.    85 y.o. female presents  for diabetic foot care. Last AMBS was 8.3. Denies numbness and tingling in their feet. Denies cramping in legs and thighs.  Review of Systems: Negative except as noted in the HPI. Denies N/V/F/Ch.  Past Medical History:  Diagnosis Date  . A-fib ([place])   . Chronic kidney disease   . Dementia ([place])   . Diabetes mellitus without complication ([place])   . Hypertension     Current Outpatient Medications:  .  atorvastatin (LIPITOR) 20 MG tablet, Take 1 tablet (20 mg total) by mouth daily at 6 PM., Disp: 30 tablet, Rfl: 0 .  ezetimibe (ZETIA) 10 MG tablet, Take 1 tablet (10 mg total) by mouth daily at 6 PM., Disp: 30 tablet, Rfl: 0 .  FLUoxetine (PROZAC) 10 MG capsule, , Disp: , Rfl:  .  JANUVIA 100 MG tablet, , Disp: , Rfl:  .  memantine (NAMENDA) 10 MG tablet, Take 10 mg by mouth 2 (two) times daily., Disp: , Rfl:  .  metoprolol succinate (TOPROL-XL) 25 MG 24 hr tablet, Take 3 tablets (75 mg total) by mouth daily. Take with or immediately following a meal., Disp: 270 tablet, Rfl: 3 .  risperiDONE (RISPERDAL) 0.5 MG tablet, Take 0.5 mg by mouth at bedtime., Disp: , Rfl:  .  sertraline (ZOLOFT) 50 MG tablet, Take 50 mg by mouth daily., Disp: , Rfl:  .  sitaGLIPtin-metformin (JANUMET) 50-500 MG tablet, Take 1 tablet by mouth 2 (two) times daily with a meal., Disp: , Rfl:  .  SitaGLIPtin-MetFORMIN HCl (JANUMET XR) [phone] MG TB24, Take 1 tablet by mouth daily., Disp: , Rfl:  .  vitamin B-12 (CYANOCOBALAMIN) 1000 MCG tablet, Take 1,000 mcg by mouth daily., Disp: , Rfl:  .  XARELTO 20 MG TABS tablet, TAKE 1 TABLET DAILY WITH SUPPER, Disp: 90 tablet, Rfl: 1  Social History   Tobacco Use  Smoking Status Never Smoker   Smokeless Tobacco Never Used    No Known Allergies Objective:   Vitals:   05/08/19 1412  Temp: 97.7 F (36.5 C)   There is no height or weight on file to calculate BMI. Constitutional Well developed. Well nourished.  Vascular Dorsalis pedis pulses present 2+ bilaterally  Posterior tibial pulses present 2+ bilaterally  Pedal hair growth diminished. Capillary refill normal to all digits.  No cyanosis or clubbing noted.  Neurologic Normal speech. Oriented to person, place, and time. Epicritic sensation to light touch grossly present bilaterally. Protective sensation with 5.07 monofilament  present bilaterally. Vibratory sensation present bilaterally.  Dermatologic Nails elongated, thickened, dystrophic. No open wounds. No skin lesions.  Orthopedic: Normal joint ROM without pain or crepitus bilaterally. No visible deformities. No bony tenderness.   Assessment:   1. Pain due to onychomycosis of nail   2. Encounter for diabetic foot exam [name])    Plan:  Patient was evaluated and treated and all questions answered.  Diabetes without Complication, Onychomycosis -Diabetic risk level 0 -Nails x10 debrided sharply and manually with large nail nipper and rotary burr.  -Recc Tolcylen gel  Procedure: Nail Debridement Rationale: pain Type of Debridement: manual, sharp debridement. Instrumentation: Nail nipper, rotary burr. Number of Nails: 10   Return if symptoms  worsen or fail to improve.

## 2019-05-09 ENCOUNTER — Ambulatory Visit: Payer: Medicare Other | Admitting: Podiatry

## 2019-05-13 DIAGNOSIS — F0390 Unspecified dementia without behavioral disturbance: Secondary | ICD-10-CM | POA: Diagnosis not present

## 2019-05-13 DIAGNOSIS — E559 Vitamin D deficiency, unspecified: Secondary | ICD-10-CM | POA: Diagnosis not present

## 2019-05-29 ENCOUNTER — Encounter: Payer: Self-pay | Admitting: Cardiology

## 2019-05-29 NOTE — Telephone Encounter (Signed)
This encounter was created in error - please disregard.

## 2019-05-29 NOTE — Telephone Encounter (Signed)
Returning this office call:  Pt's son called this office back please give pt a call.

## 2019-06-04 ENCOUNTER — Telehealth: Payer: Self-pay

## 2019-06-04 NOTE — Telephone Encounter (Signed)

## 2019-06-06 ENCOUNTER — Telehealth: Payer: Self-pay | Admitting: Cardiology

## 2019-06-06 NOTE — Telephone Encounter (Signed)
call son's smartphone [phone] consent/ my chart/ pre reg completed

## 2019-06-11 NOTE — Progress Notes (Signed)
Virtual Visit via Video Note changed to phone visit at patient request.   This visit type was conducted due to national recommendations for restrictions regarding the COVID-19 Pandemic (e.g. social distancing) in an effort to limit this patient's exposure and mitigate transmission in our community.  Due to her co-morbid illnesses, this patient is at least at moderate risk for complications without adequate follow up.  This format is felt to be most appropriate for this patient at this time.  All issues noted in this document were discussed and addressed.  A limited physical exam was performed with this format.  Please refer to the patient's chart for her consent to telehealth for [facility].   Date:  06/12/2019   ID:  Catherine Gonzales, DOB August 21, 1934, MRN [id number]  Patient Location:Home Provider Location: Home  PCP:  Mayra Neer, MD  Cardiologist:  Dr Stanford Breed  Evaluation Performed:  Follow-Up Visit  Chief Complaint:  FU atrial fibrillation  History of Present Illness:    Follow-upatrial fibrillation/atrial flutter. Patient admitted January 2018 with acute pulmonary embolus and also noted to have atrial flutter. Lower extremity Dopplers also showed DVT.Amiodarone discontinued May 2018. Last echocardiogram February 2019 showed normal LV function, grade 1 diastolic dysfunction and mild left atrial enlargement. Patient underwent cardioversion of atrial flutter in the emergency room recently. Seen in follow-up in atrial fibrillation clinic and conservative management felt indicated.   At previous office visit metoprolol increased.  She developed problems with hypotension and lisinopril discontinued.  Since she waslast seenthere is no dyspnea, CP, bleeding, or syncope.  The patient does not have symptoms concerning for COVID-19 infection (fever, chills, cough, or new shortness of breath).    Past Medical History:  Diagnosis Date  . A-fib ([place])   . Chronic kidney disease   .  Dementia ([place])   . Diabetes mellitus without complication ([place])   . Hypertension    Past Surgical History:  Procedure Laterality Date  . CHOLECYSTECTOMY       Current Meds  Medication Sig  . atorvastatin (LIPITOR) 20 MG tablet Take 1 tablet (20 mg total) by mouth daily at 6 PM.  . Cholecalciferol (VITAMIN D) 125 MCG (5000 UT) CAPS Take 1 tablet by mouth once a week.  Marland Kitchen JANUVIA 100 MG tablet   . magnesium 30 MG tablet Take 250 mg by mouth daily.  . metoprolol succinate (TOPROL-XL) 25 MG 24 hr tablet Take 3 tablets (75 mg total) by mouth daily. Take with or immediately following a meal.  . nortriptyline (PAMELOR) 10 MG capsule Take 10 mg by mouth at bedtime.  . risperiDONE (RISPERDAL) 0.5 MG tablet Take 0.5 mg by mouth at bedtime.  . vitamin B-12 (CYANOCOBALAMIN) 1000 MCG tablet Take 1,000 mcg by mouth daily.  Alveda Reasons 20 MG TABS tablet TAKE 1 TABLET DAILY WITH SUPPER     Allergies:   Patient has no known allergies.   Social History   Tobacco Use  . Smoking status: Never Smoker  . Smokeless tobacco: Never Used  Substance Use Topics  . Alcohol use: No  . Drug use: No     Family Hx: The patient's family history is negative for Deep vein thrombosis.  ROS:   Please see the history of present illness.    No Fever, chills  or productive cough All other systems reviewed and are negative.   Recent Labs: 07/11/2018: Magnesium 1.6 03/02/2019: BUN 20; Creatinine, Ser 1.17; Hemoglobin 14.3; Platelets 225; Potassium 3.6; Sodium 135    Wt  Readings from Last 3 Encounters:  06/12/19 189 lb 6.4 oz (85.9 kg)  03/02/19 202 lb (91.6 kg)  10/29/18 198 lb (89.8 kg)     Objective:    Vital Signs:  BP (!) 144/76   Pulse (!) 57   Ht 5\' 4"  (1.626 m)   Wt 189 lb 6.4 oz (85.9 kg)   SpO2 95%   BMI 32.51 kg/m    VITAL SIGNS:  reviewed NAD Answers questions appropriately Normal affect Remainder of physical examination not performed (telehealth visit; coronavirus pandemic)   ASSESSMENT & PLAN:    1. Paroxysmal atrial fibrillation/flutter-given dementia we are being conservative.  Continue Toprol and Xarelto.  Can consider addition of amiodarone in the future if needed. 2. History of pulmonary embolus-continue Xarelto. 3. Hypertension-patient's blood pressure is borderline.  However lisinopril was discontinued previously due to hypotension.  We will continue present medications and follow. 4. History of chronic diastolic congestive heart failure-she is euvolemic based on history.  We will continue with low-sodium diet and fluid restriction.  COVID-19 Education: The importance of social distancing was discussed today.  Time:   Today, I have spent 15 minutes with the patient with telehealth technology discussing the above problems.     Medication Adjustments/Labs and Tests Ordered: Current medicines are reviewed at length with the patient today.  Concerns regarding medicines are outlined above.   Tests Ordered: No orders of the defined types were placed in this encounter.   Medication Changes: No orders of the defined types were placed in this encounter.   Follow Up:  Virtual Visit or In Person in 6 month(s)  Signed, Kirk Ruths, MD  06/12/2019 8:19 AM    [place]

## 2019-06-12 ENCOUNTER — Encounter: Payer: Self-pay | Admitting: Cardiology

## 2019-06-12 ENCOUNTER — Telehealth (INDEPENDENT_AMBULATORY_CARE_PROVIDER_SITE_OTHER): Payer: Medicare Other | Admitting: Cardiology

## 2019-06-12 ENCOUNTER — Other Ambulatory Visit: Payer: Self-pay

## 2019-06-12 ENCOUNTER — Encounter

## 2019-06-12 VITALS — BP 144/76 | HR 57 | Ht 64.0 in | Wt 189.4 lb

## 2019-06-12 DIAGNOSIS — I10 Essential (primary) hypertension: Secondary | ICD-10-CM | POA: Diagnosis not present

## 2019-06-12 DIAGNOSIS — I5032 Chronic diastolic (congestive) heart failure: Secondary | ICD-10-CM | POA: Diagnosis not present

## 2019-06-12 DIAGNOSIS — I480 Paroxysmal atrial fibrillation: Secondary | ICD-10-CM

## 2019-06-12 NOTE — Patient Instructions (Signed)

## 2019-07-11 ENCOUNTER — Other Ambulatory Visit: Payer: Self-pay

## 2019-07-11 ENCOUNTER — Ambulatory Visit (INDEPENDENT_AMBULATORY_CARE_PROVIDER_SITE_OTHER): Payer: Medicare Other | Admitting: Podiatry

## 2019-07-11 VITALS — Temp 96.9°F

## 2019-07-11 DIAGNOSIS — M79676 Pain in unspecified toe(s): Secondary | ICD-10-CM | POA: Diagnosis not present

## 2019-07-11 DIAGNOSIS — M2042 Other hammer toe(s) (acquired), left foot: Secondary | ICD-10-CM

## 2019-07-11 DIAGNOSIS — M2041 Other hammer toe(s) (acquired), right foot: Secondary | ICD-10-CM

## 2019-07-11 DIAGNOSIS — M205X1 Other deformities of toe(s) (acquired), right foot: Secondary | ICD-10-CM

## 2019-07-11 DIAGNOSIS — B351 Tinea unguium: Secondary | ICD-10-CM

## 2019-07-11 DIAGNOSIS — F0390 Unspecified dementia without behavioral disturbance: Secondary | ICD-10-CM | POA: Diagnosis not present

## 2019-07-11 DIAGNOSIS — E559 Vitamin D deficiency, unspecified: Secondary | ICD-10-CM | POA: Diagnosis not present

## 2019-07-11 DIAGNOSIS — M79609 Pain in unspecified limb: Secondary | ICD-10-CM

## 2019-07-13 NOTE — Progress Notes (Signed)
  Subjective:  Patient ID: Catherine Gonzales, female    DOB: 06-16-1934,  MRN: [id number]  Chief Complaint  Patient presents with  . Toe Pain    Right 2nd digit is rubbing in shoe, some redness on toe.  . Nail Problem    Nail trim 1-5 bilateral    85 y.o. female presents  for diabetic foot care. Hx as above.  Review of Systems: Negative except as noted in the HPI. Denies N/V/F/Ch.  Past Medical History:  Diagnosis Date  . A-fib ([place])   . Chronic kidney disease   . Dementia ([place])   . Diabetes mellitus without complication ([place])   . Hypertension     Current Outpatient Medications:  .  atorvastatin (LIPITOR) 20 MG tablet, Take 1 tablet (20 mg total) by mouth daily at 6 PM., Disp: 30 tablet, Rfl: 0 .  Cholecalciferol (VITAMIN D) 125 MCG (5000 UT) CAPS, Take 1 tablet by mouth once a week., Disp: , Rfl:  .  FLUoxetine (PROZAC) 20 MG capsule, , Disp: , Rfl:  .  JANUVIA 100 MG tablet, , Disp: , Rfl:  .  magnesium 30 MG tablet, Take 250 mg by mouth daily., Disp: , Rfl:  .  metoprolol succinate (TOPROL-XL) 25 MG 24 hr tablet, Take 3 tablets (75 mg total) by mouth daily. Take with or immediately following a meal., Disp: 270 tablet, Rfl: 3 .  nortriptyline (PAMELOR) 10 MG capsule, Take 10 mg by mouth at bedtime., Disp: , Rfl:  .  risperiDONE (RISPERDAL) 0.5 MG tablet, Take 0.5 mg by mouth at bedtime., Disp: , Rfl:  .  vitamin B-12 (CYANOCOBALAMIN) 1000 MCG tablet, Take 1,000 mcg by mouth daily., Disp: , Rfl:  .  Vitamin D, Ergocalciferol, (DRISDOL) 1.25 MG (50000 UT) CAPS capsule, , Disp: , Rfl:  .  XARELTO 20 MG TABS tablet, TAKE 1 TABLET DAILY WITH SUPPER, Disp: 90 tablet, Rfl: 1  Social History   Tobacco Use  Smoking Status Never Smoker  Smokeless Tobacco Never Used    No Known Allergies Objective:   Vitals:   07/11/19 1511  Temp: (!) 96.9 F (36.1 C)   There is no height or weight on file to calculate BMI. Constitutional Well developed. Well nourished.  Vascular Dorsalis  pedis pulses present 2+ bilaterally  Posterior tibial pulses present 2+ bilaterally  Pedal hair growth diminished. Capillary refill normal to all digits.  No cyanosis or clubbing noted.  Neurologic Normal speech. Oriented to person, place, and time. Epicritic sensation to light touch grossly present bilaterally. Protective sensation with 5.07 monofilament  present bilaterally. Vibratory sensation present bilaterally.  Dermatologic Nails elongated, thickened, dystrophic. No open wounds. No skin lesions.  Orthopedic: Normal joint ROM without pain or crepitus bilaterally. Hammertoes bilat with crossover deformity right 2nd toe. No bony tenderness.   Assessment:   1. Pain due to onychomycosis of nail   2. Hammertoes of both feet   3. Crossover toe deformity of right foot    Plan:  Patient was evaluated and treated and all questions answered.  Diabetes without Complication, Onychomycosis -Diabetic risk level 0 -Hold off tolcylen.  Procedure: Nail Debridement Rationale: pain Type of Debridement: manual, sharp debridement. Instrumentation: Nail nipper, rotary burr. Number of Nails: 10  Hammertoe bilat, crossover toe deformity -Recc crest pad    Return in about 9 weeks (around 09/12/2019) for Diabetic Foot Care.

## 2019-07-14 ENCOUNTER — Other Ambulatory Visit: Payer: Self-pay | Admitting: Family Medicine

## 2019-07-14 DIAGNOSIS — M79661 Pain in right lower leg: Secondary | ICD-10-CM

## 2019-07-15 ENCOUNTER — Ambulatory Visit
Admission: RE | Admit: 2019-07-15 | Discharge: 2019-07-15 | Disposition: A | Discharge status: Home / Self Care | Payer: Medicare Other | Source: Ambulatory Visit | Attending: Family Medicine | Admitting: Family Medicine

## 2019-07-15 DIAGNOSIS — R2241 Localized swelling, mass and lump, right lower limb: Secondary | ICD-10-CM | POA: Diagnosis not present

## 2019-07-15 DIAGNOSIS — M79661 Pain in right lower leg: Secondary | ICD-10-CM

## 2019-08-22 DIAGNOSIS — D6869 Other thrombophilia: Secondary | ICD-10-CM | POA: Diagnosis not present

## 2019-08-22 DIAGNOSIS — I129 Hypertensive chronic kidney disease with stage 1 through stage 4 chronic kidney disease, or unspecified chronic kidney disease: Secondary | ICD-10-CM | POA: Diagnosis not present

## 2019-08-22 DIAGNOSIS — L989 Disorder of the skin and subcutaneous tissue, unspecified: Secondary | ICD-10-CM | POA: Diagnosis not present

## 2019-08-22 DIAGNOSIS — E538 Deficiency of other specified B group vitamins: Secondary | ICD-10-CM | POA: Diagnosis not present

## 2019-08-22 DIAGNOSIS — Z0000 Encounter for general adult medical examination without abnormal findings: Secondary | ICD-10-CM | POA: Diagnosis not present

## 2019-08-22 DIAGNOSIS — N183 Chronic kidney disease, stage 3 (moderate): Secondary | ICD-10-CM | POA: Diagnosis not present

## 2019-08-22 DIAGNOSIS — F322 Major depressive disorder, single episode, severe without psychotic features: Secondary | ICD-10-CM | POA: Diagnosis not present

## 2019-08-22 DIAGNOSIS — E559 Vitamin D deficiency, unspecified: Secondary | ICD-10-CM | POA: Diagnosis not present

## 2019-08-22 DIAGNOSIS — I4892 Unspecified atrial flutter: Secondary | ICD-10-CM | POA: Diagnosis not present

## 2019-08-22 DIAGNOSIS — E782 Mixed hyperlipidemia: Secondary | ICD-10-CM | POA: Diagnosis not present

## 2019-08-22 DIAGNOSIS — F0390 Unspecified dementia without behavioral disturbance: Secondary | ICD-10-CM | POA: Diagnosis not present

## 2019-08-22 DIAGNOSIS — E1122 Type 2 diabetes mellitus with diabetic chronic kidney disease: Secondary | ICD-10-CM | POA: Diagnosis not present

## 2019-08-22 DIAGNOSIS — E46 Unspecified protein-calorie malnutrition: Secondary | ICD-10-CM | POA: Diagnosis not present

## 2019-08-22 DIAGNOSIS — Z7984 Long term (current) use of oral hypoglycemic drugs: Secondary | ICD-10-CM | POA: Diagnosis not present

## 2019-08-22 DIAGNOSIS — I2782 Chronic pulmonary embolism: Secondary | ICD-10-CM | POA: Diagnosis not present

## 2019-08-27 NOTE — Progress Notes (Deleted)
[id number] fibrillation/atrial flutter. Patient admitted January 2018 with acute pulmonary embolus and also noted to have atrial flutter. Lower extremity Dopplers also showed DVT.Amiodarone discontinued May 2018. Last echocardiogram February 2019 showed normal LV function, grade 1 diastolic dysfunction and mild left atrial enlargement. Patient underwent cardioversion of atrial flutter in the emergency room recently. Seen in follow-up in atrial fibrillation clinic and conservative management felt indicated.At previous office visit metoprolol increased. She developed problems with hypotension and lisinopril discontinued. Since she waslast seen  Current Outpatient Medications  Medication Sig Dispense Refill  . atorvastatin (LIPITOR) 20 MG tablet Take 1 tablet (20 mg total) by mouth daily at 6 PM. 30 tablet 0  . Cholecalciferol (VITAMIN D) 125 MCG (5000 UT) CAPS Take 1 tablet by mouth once a week.    Marland Kitchen FLUoxetine (PROZAC) 20 MG capsule     . JANUVIA 100 MG tablet     . magnesium 30 MG tablet Take 250 mg by mouth daily.    . metoprolol succinate (TOPROL-XL) 25 MG 24 hr tablet Take 3 tablets (75 mg total) by mouth daily. Take with or immediately following a meal. 270 tablet 3  . nortriptyline (PAMELOR) 10 MG capsule Take 10 mg by mouth at bedtime.    . risperiDONE (RISPERDAL) 0.5 MG tablet Take 0.5 mg by mouth at bedtime.    . vitamin B-12 (CYANOCOBALAMIN) 1000 MCG tablet Take 1,000 mcg by mouth daily.    . Vitamin D, Ergocalciferol, (DRISDOL) 1.25 MG (50000 UT) CAPS capsule     . XARELTO 20 MG TABS tablet TAKE 1 TABLET DAILY WITH SUPPER 90 tablet 1   No current facility-administered medications for this visit.      Past Medical History:  Diagnosis Date  . A-fib ([place])   . Chronic kidney disease   . Dementia ([place])   . Diabetes mellitus without complication ([place])   . Hypertension     Past Surgical History:  Procedure Laterality Date  . CHOLECYSTECTOMY       Social History   Socioeconomic History  . Marital status: Married    Spouse name: Not on file  . Number of children: Not on file  . Years of education: Not on file  . Highest education level: Not on file  Occupational History  . Not on file  Social Needs  . Financial resource strain: Not on file  . Food insecurity    Worry: Not on file    Inability: Not on file  . Transportation needs    Medical: Not on file    Non-medical: Not on file  Tobacco Use  . Smoking status: Never Smoker  . Smokeless tobacco: Never Used  Substance and Sexual Activity  . Alcohol use: No  . Drug use: No  . Sexual activity: Not on file  Lifestyle  . Physical activity    Days per week: Not on file    Minutes per session: Not on file  . Stress: Not on file  Relationships  . Social Herbalist on phone: Not on file    Gets together: Not on file    Attends religious service: Not on file    Active member of club or organization: Not on file    Attends meetings of clubs or organizations: Not on file    Relationship status: Not on file  . Intimate partner violence    Fear of current or ex partner: Not on file    Emotionally abused: Not on  file    Physically abused: Not on file    Forced sexual activity: Not on file  Other Topics Concern  . Not on file  Social History Narrative  . Not on file    Family History  Problem Relation Age of Onset  . Deep vein thrombosis Neg Hx     ROS: no fevers or chills, productive cough, hemoptysis, dysphasia, odynophagia, melena, hematochezia, dysuria, hematuria, rash, seizure activity, orthopnea, PND, pedal edema, claudication. Remaining systems are negative.  Physical Exam: Well-developed well-nourished in no acute distress.  Skin is warm and dry.  HEENT is normal.  Neck is supple.  Chest is clear to auscultation with normal expansion.  Cardiovascular exam is regular rate and rhythm.  Abdominal exam nontender or distended. No masses palpated.  Extremities show no edema. neuro grossly intact  ECG- personally reviewed  A/P  1 paroxysmal atrial fibrillation/flutter-conservative measures given patient's underlying dementia and age.  Continue Toprol at present dose.  Continue Xarelto.  2 prior pulmonary embolus-continue Xarelto.  3 chronic diastolic congestive heart failure-patient appears to be euvolemic on examination.  Continue fluid restriction and low-sodium diet.  4 hypertension-blood pressure is controlled.  Continue present medications and follow.  Kirk Ruths, MD

## 2019-08-29 ENCOUNTER — Encounter: Payer: Self-pay | Admitting: Neurology

## 2019-08-30 ENCOUNTER — Other Ambulatory Visit: Payer: Self-pay | Admitting: Cardiology

## 2019-09-01 ENCOUNTER — Ambulatory Visit: Payer: Medicare Other | Admitting: Cardiology

## 2019-09-01 NOTE — Telephone Encounter (Signed)
24f 85.9kg Scr 1.17 03/02/19 ccr 49.72mlmin Pt requesting 20mg  xarelto but only qualifies for 15mg  needs a pharmd review

## 2019-09-01 NOTE — Telephone Encounter (Signed)
Pt weight down approx 12 pounds (telehealth weight) since earlier this year.  CrCl at 49.4 (dose change < 50).  Will have her continue with xarelto 20 mg for 3 more months.  She has follow up in office with Dr. Stanford Breed in December.  Will verify weight at that time and decrease dose if needed.

## 2019-09-12 ENCOUNTER — Other Ambulatory Visit: Payer: Self-pay

## 2019-09-12 ENCOUNTER — Ambulatory Visit (INDEPENDENT_AMBULATORY_CARE_PROVIDER_SITE_OTHER): Payer: Medicare Other | Admitting: Podiatry

## 2019-09-12 ENCOUNTER — Encounter: Payer: Self-pay | Admitting: Podiatry

## 2019-09-12 DIAGNOSIS — B351 Tinea unguium: Secondary | ICD-10-CM

## 2019-09-12 DIAGNOSIS — M79676 Pain in unspecified toe(s): Secondary | ICD-10-CM

## 2019-09-12 DIAGNOSIS — M79609 Pain in unspecified limb: Secondary | ICD-10-CM

## 2019-09-17 NOTE — Progress Notes (Signed)
  Subjective:  Patient ID: Catherine Gonzales, female    DOB: Apr 18, 1934,  MRN: [id number]  Chief Complaint  Patient presents with  . Nail Problem    trim  . Diabetes    feet exam    85 y.o. female presents with the above complaint.  Reports painfully elongated nails to both feet.  Review of Systems: Negative except as noted in the HPI. Denies N/V/F/Ch.  Past Medical History:  Diagnosis Date  . A-fib ([place])   . Chronic kidney disease   . Dementia ([place])   . Diabetes mellitus without complication ([place])   . Hypertension     Current Outpatient Medications:  .  atorvastatin (LIPITOR) 20 MG tablet, Take 1 tablet (20 mg total) by mouth daily at 6 PM., Disp: 30 tablet, Rfl: 0 .  Cholecalciferol (VITAMIN D) 125 MCG (5000 UT) CAPS, Take 1 tablet by mouth once a week., Disp: , Rfl:  .  FLUoxetine (PROZAC) 20 MG capsule, , Disp: , Rfl:  .  JANUVIA 100 MG tablet, , Disp: , Rfl:  .  magnesium 30 MG tablet, Take 250 mg by mouth daily., Disp: , Rfl:  .  nortriptyline (PAMELOR) 10 MG capsule, Take 10 mg by mouth at bedtime., Disp: , Rfl:  .  risperiDONE (RISPERDAL) 1 MG tablet, , Disp: , Rfl:  .  TOPROL XL 25 MG 24 hr tablet, TAKE 3 TABLETS DAILY. TAKE WITH OR IMMEDIATELY FOLLOWING A MEAL, Disp: 270 tablet, Rfl: 0 .  vitamin B-12 (CYANOCOBALAMIN) 1000 MCG tablet, Take 1,000 mcg by mouth daily., Disp: , Rfl:  .  Vitamin D, Ergocalciferol, (DRISDOL) 1.25 MG (50000 UT) CAPS capsule, , Disp: , Rfl:  .  XARELTO 20 MG TABS tablet, TAKE 1 TABLET DAILY WITH SUPPER, Disp: 90 tablet, Rfl: 0  Social History   Tobacco Use  Smoking Status Never Smoker  Smokeless Tobacco Never Used    No Known Allergies Objective:  There were no vitals filed for this visit. There is no height or weight on file to calculate BMI. Constitutional Well developed. Well nourished.  Vascular Dorsalis pedis pulses palpable bilaterally. Posterior tibial pulses palpable bilaterally. Capillary refill normal to all digits.  No  cyanosis or clubbing noted. Pedal hair growth normal.  Neurologic Normal speech. Oriented to person, place, and time. Epicritic sensation to light touch grossly present bilaterally.  Dermatologic Nails elongated dystrophic pain to palpation No open wounds. No skin lesions.  Orthopedic: Normal joint ROM without pain or crepitus bilaterally. No visible deformities. No bony tenderness.   Radiographs: None Assessment:   1. Pain due to onychomycosis of nail    Plan:  Patient was evaluated and treated and all questions answered.  Onychomycosis with pain -Nails palliatively debridement as below -Educated on self-care  Procedure: Nail Debridement Rationale: Pain Type of Debridement: manual, sharp debridement. Instrumentation: Nail nipper, rotary burr. Number of Nails: 10    Return in about 9 weeks (around 11/14/2019) for Diabetic Foot Care.

## 2019-10-03 ENCOUNTER — Ambulatory Visit: Payer: Medicare Other | Admitting: Neurology

## 2019-10-03 ENCOUNTER — Encounter: Payer: Self-pay | Admitting: Psychology

## 2019-10-03 ENCOUNTER — Encounter

## 2019-10-03 ENCOUNTER — Other Ambulatory Visit: Payer: Self-pay

## 2019-10-03 ENCOUNTER — Ambulatory Visit: Payer: Medicare Other | Admitting: Psychology

## 2019-10-03 ENCOUNTER — Ambulatory Visit (INDEPENDENT_AMBULATORY_CARE_PROVIDER_SITE_OTHER): Payer: Medicare Other | Admitting: Psychology

## 2019-10-03 DIAGNOSIS — F0150 Vascular dementia without behavioral disturbance: Secondary | ICD-10-CM

## 2019-10-03 DIAGNOSIS — Z23 Encounter for immunization: Secondary | ICD-10-CM | POA: Diagnosis not present

## 2019-10-03 DIAGNOSIS — F0390 Unspecified dementia without behavioral disturbance: Secondary | ICD-10-CM

## 2019-10-03 DIAGNOSIS — F323 Major depressive disorder, single episode, severe with psychotic features: Secondary | ICD-10-CM | POA: Diagnosis not present

## 2019-10-03 NOTE — Progress Notes (Addendum)
NEUROPSYCHOLOGICAL EVALUATION [place] Department of Neurology  Reason for Referral:   Catherine Gonzales is a 85 y.o. Caucasian female referred by Mayra Neer, M.D., to characterize her current cognitive functioning and assist with diagnostic clarity and treatment planning in the context of worsening cognitive decline with differential diagnoses including Alzheimer's dementia or Parkinson's disease, a history of major depressive disorder with psychotic features, and additional medical comorbidities.  Assessment and Plan:   Clinical Impression(s): Catherine Gonzales' pattern of performance is suggestive of primary impairments surrounding executive functioning. Additional impairments were seen across aspects of basic and complex attention, complex visuospatial functioning, and verbal learning and memory. Performance variability was seen across processing speed, verbal fluency, and confrontation naming. Performance was within normal limits across domains of receptive language, basic visuospatial functioning, and visual learning and memory. Overall, given evidence for significant cognitive dysfunction, coupled with [name] and her daughter's report of difficulties performing instrumental ADLs independently, she meets criteria for a Major Neurocognitive Disorder (formerly "dementia") at the present time.  The etiology of Catherine Gonzales' cognitive dysfunction is likely multifactorial in nature. I believe there is a vascular component to her presentation, given her medical history and profound difficulties with executive functioning tasks, as well as very low scores across tasks assessing both basic and complex attention. However, as the rate of cognitive decline appears more advanced (no prior testing is available for comparison purposes to confirm this suspicion) relative to a solely vascular presentation, it is very possible that she is also experiencing Alzheimer's disease  (also known as a mixed dementia presentation). Very poor performance across verbal memory and semantic fluency tasks support this assumption. However, scores across visual memory, basic visuospatial abilities, and confrontation naming tasks were largely within normal limits, inconsistent with a typical Alzheimer's disease presentation. The presence of very mild and sporadic tremulous behaviors, coupled with no report of balance instability or gait abnormalities, appears inconsistent with a Parkinson's Disease Dementia presentation.  Additional factors which can create and maintain cognitive inefficiencies include significant psychiatric distress. I agree with Drs. Brigitte Pulse and Plovsky that a Major Depressive Disorder with Psychotic Features presentation seems more appropriate relative to a diagnosis on the Schizophrenia spectrum. While psychiatric distress can certainly influence cognitive efficiency, current deficits are above and beyond mild difficulties which are typically seen.  Recommendations: Repeat neuropsychological evaluation in 12-18 months (or sooner if functional decline is noted) is recommended to assess the trajectory of future cognitive decline should it occur.  If not already done, Ms. Frate should establish care with a neurologist to allow for more focused treatment of neurological and cognitive dysfunction.  No neuroimaging was available for review. If this has not already been performed, completion of a brain MRI would be beneficial to look for anatomical correlates of cognitive dysfunction and to help track disease progression over time.   It will be important for Catherine Gonzales to have another person with her when in situations where she may need to process information, weigh the pros and cons of different options, and make decisions, in order to ensure that she fully understands and recalls all information to be considered. Additionally, important information to be remember should be  provided in written format and placed in a highly trafficked and visible location whenever possible.   If not already done, Catherine Gonzales and her family may want to discuss her wishes regarding durable power of attorney and medical decision making, so that she can have input into these choices.  Additionally, they may wish to discuss future plans for caretaking and seek out community options for in home/residential care should they become necessary.  To address problems with fluctuating attention, she may wish to consider:   -Avoiding external distractions when needing to concentrate   -Limiting exposure to fast paced environments with multiple sensory demands   -Writing down complicated information and using checklists   -Attempting and completing one task at a time (i.e., no multi-tasking)   -Taking frequent breaks during the completion of steps/tasks to avoid fatigue   -Reducing the amount of information considered at one time  Review of Records:   Catherine Gonzales was seen by Mayra Neer, M.D. on 08/22/2019 for her annual wellness visit and to discuss ongoing cognitive changes. Regarding the latter, Catherine Gonzales noted that she was doing well and is without complaints. However, her daughter noted concerns regarding cognitive decline, including [name] commonly staring off into space and is not "mentally there" all the time. Her daughter also noted personality changes, including Catherine Gonzales seeming withdrawn and with a flat affect, generally spending her days sitting in her chair without the television on and does not engage in any other form of social interaction. Catherine Gonzales's psychiatrist (Dr. Casimiro Needle) expressed concerns regarding a combination of Alzheimer's disease and major depressive disorder with psychosis in 2019. Dr. Brigitte Pulse expressed similar concerns during this appointment. As such, Catherine Gonzales was referred for a comprehensive neuropsychological evaluation to characterize her cognitive abilities  and to assist with diagnostic clarity and future treatment planning.  Neuroimaging was unavailable for review.  Past Medical History:  Diagnosis Date   A-fib [facility])    Atrial flutter ([place]) 01/14/2017   Chronic kidney disease    Diabetes mellitus type II, non insulin dependent ([place]) 01/14/2017   DVT of lower extremity, bilateral ([place]): peroneal veins 01/15/2017   Hypertension    Major depressive disorder with psychotic features ([place])    Major neurocognitive disorder ([facility])    Pulmonary embolus ([place]) 01/13/2017    Past Surgical History:  Procedure Laterality Date   CHOLECYSTECTOMY      Family History  Problem Relation Age of Onset   Deep vein thrombosis Neg Hx      Current Outpatient Medications:    atorvastatin (LIPITOR) 20 MG tablet, Take 1 tablet (20 mg total) by mouth daily at 6 PM., Disp: 30 tablet, Rfl: 0   Cholecalciferol (VITAMIN D) 125 MCG (5000 UT) CAPS, Take 1 tablet by mouth once a week., Disp: , Rfl:    FLUoxetine (PROZAC) 20 MG capsule, , Disp: , Rfl:    JANUVIA 100 MG tablet, , Disp: , Rfl:    magnesium 30 MG tablet, Take 250 mg by mouth daily., Disp: , Rfl:    nortriptyline (PAMELOR) 10 MG capsule, Take 10 mg by mouth at bedtime., Disp: , Rfl:    risperiDONE (RISPERDAL) 1 MG tablet, , Disp: , Rfl:    TOPROL XL 25 MG 24 hr tablet, TAKE 3 TABLETS DAILY. TAKE WITH OR IMMEDIATELY FOLLOWING A MEAL, Disp: 270 tablet, Rfl: 0   vitamin B-12 (CYANOCOBALAMIN) 1000 MCG tablet, Take 1,000 mcg by mouth daily., Disp: , Rfl:    Vitamin D, Ergocalciferol, (DRISDOL) 1.25 MG (50000 UT) CAPS capsule, , Disp: , Rfl:    XARELTO 20 MG TABS tablet, TAKE 1 TABLET DAILY WITH SUPPER, Disp: 90 tablet, Rfl: 0  Clinical Interview:   Cognitive Symptoms: Decreased short-term memory: Endorsed. Ms. Redeker reported mild difficulties with generalized forgetfulness. More specific examples were  not provided; she stated that she "doesn't really notice" any cognitive changes.  However, her daughter expressed more significant concerns regarding short-term memory, providing an example that Ms. Boxberger seemingly forgot about a vacation to the beach they took very shortly after returning from said vacation.  Decreased long-term memory: Denied. Decreased attention/concentration: Denied. However, Ms. Koebel' daughter expressed concerns that her mother will frequently stare off into space and seemingly be unresponsive. This is particularly noteworthy while the two of them speak on the phone, where Ms. Kowitz will often not respond to her daughter initially and require several prompts in order to garner a response.  Reduced processing speed: Unclear. Ms. Mclamore did not appear to understand the question being asked.  Difficulties with executive functions: Denied. Difficulties with emotion regulation: Denied. Difficulties with receptive language: Denied. However, her daughter expressed concerns (see comment surrounding attention/concentration difficulties). Difficulties with word finding: Denied. Decreased visuoperceptual ability: Denied.  Trajectory of deficits: Ms. Randol was unable to provide a specific timeline for cognitive changes as she stated that she "doesn't really notice" the presence of difficulties in her day to day life. Her daughter noted a decline occurring over the past few years. However, this has been variable as Ms. Keibler has experienced several mood-related episodes of psychosis throughout this time.   Difficulties completing ADLs: Endorsed. Ms. Umphlett' daughter and son assist her with medication and financial management. Her daughter also stated that Ms. Will would have significant difficulty completing these tasks independently and would likely forget to take her medications. Ms. Cortijo currently drives short distances (about 2 blocks to the hair salon once per week) and denied any difficulties or accidents/close calls.   Additional Medical  History: History of traumatic brain injury/concussion: Denied. History of stroke: Denied. History of seizure activity: Denied. History of known exposure to toxins: Denied. Symptoms of chronic pain: Denied. Experience of frequent headaches/migraines: Denied. Frequent instances of dizziness/vertigo: Denied.  Sensory changes: Denied. Balance/coordination difficulties: Denied. Other motor difficulties: Endorsed. Ms. Tolentino endorsed a tremor impacting her left hand. Tremulous behaviors were said to be mild in nature and occur sporadically rather than be present all the time. It also seemed to be more prevalent during active states rather than resting states. Medication interventions to combat tremulous behaviors have not been attempted.   Sleep History: Estimated hours obtained each night: 7-8 hours. However, her daughter noted that if she or her brother do not call Ms. Mcewan in the morning to wake her up, she will sleep for 10-12 hours at a time.  Difficulties falling asleep: Denied. Difficulties staying asleep: Denied. Feels rested and refreshed upon awakening: Endorsed.  History of snoring: Denied. History of waking up gasping for air: Denied. Witnessed breath cessation while asleep: Denied.  History of vivid dreaming: Denied. Excessive movement while asleep: Denied. Instances of acting out her dreams: Denied.  Psychiatric/Behavioral Health History: Depression: Ms. Schurz denied prior mental health concerns, as well as previous diagnoses surrounding depression. However, medical records suggest a prior diagnosis of major depressive disorder with psychotic features. Her daughter also noted a pattern of "bouts" of psychotic behaviors, generally including visual hallucinations (e.g., arrows on the floor), delusional thoughts, and symptoms of paranoia. Most recently, this was said to occur this past July. Prior to that, a period of psychosis was said to occur 2 years prior, and then every  6-10 years prior to that. Ms. Krason' daughter was unclear if these periods were linked to depressive mood states. However, she did note that her mother was  hospitalized for psychiatric reasons following her (daughter's) birth, potentially due to post-partum depression. It is also possible that her most likely bout of psychosis was related to one of her best friends suffering a stroke and being moved to a SNF, coupled with experiences of isolation and social withdrawal due to the ongoing COVID-19 pandemic. Current or remote suicidal ideation, intent, or plan was denied.  Anxiety: Denied.  Personality changes: Endorsed. Following Ms. Kaska' recent bout of psychosis, she was started on several medications, which were effective at treating these symptoms. However, notable personality changes were said to persist, generally surrounding Ms. Avery exhibiting a flat affect and seemingly socially withdrawn. Her daughter noted that she will commonly sit alone in her bedroom without the television on and stare into space for extended periods of time. Her daughter also noted that where Ms. Peterkin used to be very bubbly and conversational, she no longer exhibits these traits and does not seem like the same individual.  Mania: Denied. Trauma History: Denied. Visual/auditory hallucinations: Endorsed (see above). Delusional thoughts: Endorsed (see above).  Tobacco: Denied. Alcohol: Ms. Zeiset denied current alcohol consumption. She likewise denied a history of problematic alcohol use, abuse, or dependence.  Recreational drugs: Denied. Caffeine: Denied.  Academic/Vocational History: Highest level of educational attainment: 12 years. Ms. 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 She did not identify any longstanding academic weaknesses. History of developmental delay: Denied. History of grade repetition: Denied. History of class failures: Denied. Enrollment in special  education courses: Denied. History of diagnosed specific learning disability: Denied. History of ADHD: Denied.  Employment: Retired. Ms. 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   Evaluation Results:   Behavioral Observations: Ms. Huckleby was accompanied by her daughter, arrived to her appointment on time, and was appropriately dressed and groomed. Observed gait and station were within normal limits. Gross motor functioning appeared intact upon informal observation and no abnormal movements (e.g., tremors) were noted during interview; however, some shakiness was observed while completing various motor-based cognitive assessments. Her affect was generally relaxed and positive, but did range appropriately given the subject being discussed during the clinical interview or the task at hand during testing procedures. Spontaneous speech was fluent and word finding difficulties were not observed during the clinical interview or testing procedures. Sustained attention was appropriate throughout. Thought processes were coherent, organized, and normal in content. Task engagement was largely adequate. However, she did discontinue several tasks prematurely due to her not understanding task instructions. She also appeared to fatigue somewhat as the evaluation progressed. Overall, Ms. Budman was cooperative with the clinical interview and subsequent testing procedures.   Adequacy of Effort: The validity of neuropsychological testing is limited by the extent to which the individual being tested may be assumed to have exerted adequate effort during testing. Ms. Attias expressed her intention to perform to the best of her abilities and exhibited adequate task engagement and persistence. Scores across stand-alone and embedded performance validity measures were variable.  However, below expectation performance across one embedded validity indicator (CVLT-III) is believed to represent true cognitive dysfunction rather than poor engagement or attempts to perform poorly. As such, the results of the current evaluation are believed to be a largely valid representation of Ms. Pearcey' current cognitive functioning.  Test Results: Ms. Rohrbaugh was somewhat oriented at the time of the current evaluation. Points were lost  for her stating the incorrect date (0/2) and time (1/2), as well as being unable to name her current location (0/2).  Intellectual abilities based upon educational and vocational attainment were estimated to be in the average range. Premorbid abilities were estimated to be within the below average range based upon a single-word reading test.   Processing speed was variable, ranging from the well below average to average normative ranges. Basic attention was well below average. More complex attention (e.g., working memory) was also well below average. Performance across tasks assessing executive functions (e.g., cognitive flexibility, response inhibition, nonverbal abstract reasoning, analytical problem solving) were exceptionally low and represented a notable weakness across the current evaluation.  Assessed receptive language abilities were within normal limits. However, Ms. Munda did exhibit any difficulties comprehending task instructions during testing, especially as complexity increased. Assessed expressive language (e.g., verbal fluency and confrontation naming) was variable. Phenomic fluency was below average, semantic fluency was well below average, and confrontation naming was well below average to average.   Basic visuospatial/visuoconstructional abilities were within normal limits. However, more complex spatial tasks were well below average.   Learning (i.e., encoding) of novel verbal information was exceptionally low, while learning visual  information was above average. Spontaneous delayed recall (i.e., retrieval) of previously learned information was exceptionally low across verbal tasks and average across a visual task. Performance across a verbal list learning recognition task was very poor, suggesting limited evidence for appropriate information consolidation.   Results of emotional screening instruments suggested that recent symptoms of generalized anxiety were in the minimal range, while symptoms of depression were within normal limits. A screening instrument assessing recent sleep quality suggested the presence of minimal sleep dysfunction.  Tables of Scores:   Note: This summary of test scores accompanies the interpretive report and should not be considered in isolation without reference to the appropriate sections in the text. Descriptors are based on appropriate normative data and may be adjusted based on clinical judgment. The terms impaired and within normal limits (WNL) are used when a more specific level of functioning cannot be determined.       Effort Testing:   DESCRIPTOR       Dot Counting Test: --- --- Within Expectation  CVLT-III Forced Choice Recognition: --- --- Below Expectation       Cognitive Screening:          NAB Screening Battery, Form 1 Standard Score/ T Score Percentile   Total Score 63 1 Exceptionally Low    Orientation 24/29 <1 Exceptionally Low  Attention Domain 63 1 Exceptionally Low    Digits Forward 33 5 Well Below Average    Digits Backwards 34 5 Well Below Average    Letters & Numbers A Efficiency 36 8 Well Below Average    Letters & Numbers B Efficiency 32 4 Well Below Average  Language Domain 84 14 Below Average    Auditory Comprehension 55 69 Average    Naming 30 2 Well Below Average  Memory Domain 73 7 Well Below Average    Shape Learning Immediate Recognition 61 86 Above Average    Story Learning Immediate Recall 27 1 Exceptionally Low    Shape Learning Delayed Recognition  44 27 Average    Story Learning Delayed Recall 26 1 Exceptionally Low  Spatial Domain 83 13 Below Average    Visual Discrimination 50 50 Average    Design Construction 32 4 Well Below Average  Executive Functions Domain 64 1 Exceptionally Low    Mazes  93 7 Well Below Average    Word Generation 26 1 Exceptionally Low       Intellectual Functioning:           Standard Score Percentile   Test of Premorbid Functioning: 83 13 Below Average       Memory:          [place] Verbal Learning Test (CVLT-III) Brief Form: Raw Score (Scaled/Standard Score) Percentile     Total Trials 1-4 10/36 (57) <1 Exceptionally Low    Short-Delay Free Recall 1/9 (1) <1 Exceptionally Low    Long-Delay Free Recall 0/9 (1) <1 Exceptionally Low    Long-Delay Cued Recall 1/9 (1) <1 Exceptionally Low      Recognition Hits 6/9 (7) 16 Below Average      False Positive Errors 7 (1) <1 Exceptionally Low       Attention/Executive Function:          Trail Making Test (TMT): Raw Score  (T Score) Percentile     Part A 74 secs.,  1 errors (31) 3 Well Below Average    Part B Discontinued --- Impaired       D-KEFS Color-Word Interference Test: Raw Score (Scaled Score) Percentile     Color Naming 39 secs. (9) 37 Average    Word Reading 31 secs. (8) 25 Average    Inhibition 180 secs. (1) <1 Exceptionally Low      Total Errors 17 errors (1) <1 Exceptionally Low    Inhibition/Switching Discontinued --- Impaired      Total Errors --- --- ---       D-KEFS 20 Questions Test: Scaled Score Percentile     Total Weighted Achievement Score Discontinued --- Impaired    Initial Abstraction Score --- --- ---       Language:          Verbal Fluency Test: Raw Score  (Z-Score) Percentile     Phonemic Fluency (FAS) 16 (-1.21) 12 Below Average    Animal Fluency 7 (-2.03) 3 Exceptionally Low  *Based on [employer] Older Normative Studies (MOANS)          NAB Language Module, Form 1: T Score Percentile     Naming 53 62 Average        Visuospatial/Visuoconstruction:      Raw Score Percentile   Clock Drawing: 9/10 --- Within Normal Limits        Scaled Score Percentile   WAIS-IV Matrix Reasoning: Discontinued --- Impaired  WAIS-IV Visual Puzzles: Discontinued --- Impaired       Mood and Personality:      Raw Score Percentile   Geriatric Depression Scale: 2 --- Within Normal Limits  Geriatric Anxiety Scale: 2 --- Minimal    Somatic 2 --- Minimal    Cognitive 0 --- Minimal    Affective 0 --- Minimal       Additional Questionnaires:      Raw Score Percentile   PROMIS Sleep Disturbance Questionnaire: 9 --- None to Slight   Informed Consent and Coding/Compliance:   Ms. Shavers was provided with a verbal description of the nature and purpose of the present neuropsychological evaluation. Also reviewed were the foreseeable risks and/or discomforts and benefits of the procedure, limits of confidentiality, and mandatory reporting requirements of this provider. The patient was given the opportunity to ask questions and receive answers about the evaluation. Oral consent to participate was provided by the patient.   This evaluation was conducted by Christia Reading, Ph.D., licensed clinical  neuropsychologist. Ms. Rolfe completed a 30-minute clinical interview, billed as one unit [phone], and 110 minutes of cognitive testing, billed as one unit [phone] and three additional units 96139. Psychometrist Milana Kidney, B.S., assisted Dr. Melvyn Novas with test administration and scoring procedures. As a separate and discrete service, Dr. Melvyn Novas spent a total of 180 minutes in interpretation and report writing, billed as one unit 96132 and two units 96133.

## 2019-10-03 NOTE — Progress Notes (Signed)
   Neuropsychology Note   GULIANA SANDMEYER completed 95 minutes of neuropsychological testing with technician, Milana Kidney, B.S., under the supervision of Dr. Christia Reading, Ph.D., licensed neuropsychologist. The patient did not appear overtly distressed by the testing session, per behavioral observation or via self-report to the technician. Rest breaks were offered.    In considering the patient's current level of functioning, level of presumed impairment, nature of symptoms, emotional and behavioral responses during the interview, level of literacy, and observed level of motivation/effort, a battery of tests was selected and communicated to the psychometrician.   Communication between the psychologist and technician was ongoing throughout the testing session and changes were made as deemed necessary based on patient performance on testing, technician observations and additional pertinent factors such as those listed above.   ALFREDA FENTON will return within approximately two weeks for an interactive feedback session with Dr. Melvyn Novas at which time his test performances, clinical impressions, and treatment recommendations will be reviewed in detail. The patient understands she can contact our office should she require our assistance before this time.   Full report to follow.  95 minutes were spent face-to-face with patient administering standardized tests and 15 minutes were spent scoring (technician). [CPT [id number], [id number]

## 2019-10-06 ENCOUNTER — Encounter: Payer: Self-pay | Admitting: Psychology

## 2019-10-06 DIAGNOSIS — F323 Major depressive disorder, single episode, severe with psychotic features: Secondary | ICD-10-CM | POA: Insufficient documentation

## 2019-10-07 ENCOUNTER — Telehealth: Payer: Self-pay | Admitting: Cardiology

## 2019-10-07 NOTE — Telephone Encounter (Signed)
Called son back- he states that his mothers HR is normally in the mid-50's, and low 60's. He wanted to see if patient could be seen today by Dr.[name]. I advised that Dr.[name] was not in this week- but I could see if a PA could see them next week to discuss concerns. Patient's son just wanted to know if her HR this low was okay. I looked at previous vital signs, seems that patient is always in the 105's was at last telemed visit with MD. Advised son that normal HR is 60, and can at times drop below that. Son states that mother is having no symptoms, she is doing well. BP has been normal last was 113/62. Explained to son to continue to monitor, if HR continues to drop lower than her normal- and she begins to have symptoms to let us know  Son verbalized understanding.

## 2019-10-07 NOTE — Telephone Encounter (Signed)
New message:    Patient son calling stating that his mother HR has been running about 14. Patient son would like for the patient to be seen today because is off work.

## 2019-10-20 ENCOUNTER — Encounter: Payer: Medicare Other | Admitting: Psychology

## 2019-10-22 ENCOUNTER — Other Ambulatory Visit: Payer: Self-pay

## 2019-10-22 ENCOUNTER — Ambulatory Visit (INDEPENDENT_AMBULATORY_CARE_PROVIDER_SITE_OTHER): Payer: Medicare Other | Admitting: Psychology

## 2019-10-22 ENCOUNTER — Encounter: Payer: Self-pay | Admitting: Psychology

## 2019-10-22 DIAGNOSIS — F0150 Vascular dementia without behavioral disturbance: Secondary | ICD-10-CM

## 2019-10-22 DIAGNOSIS — F0390 Unspecified dementia without behavioral disturbance: Secondary | ICD-10-CM

## 2019-10-22 NOTE — Progress Notes (Signed)
° °  Neuropsychology Feedback Session Catherine Gonzales. [facility]  Reason for Referral:   Catherine Gonzales a 85 y.o. Caucasian female referred by Mayra Neer, M.D.,to characterize hercurrent cognitive functioning and assist with diagnostic clarity and treatment planning in the context of worsening cognitive decline with differential diagnoses including Alzheimer's dementia or Parkinson's disease, a history of major depressive disorder with psychotic features, and additional medical comorbidities.  Feedback:   Catherine Gonzales completed a comprehensive neuropsychological evaluation on 10/03/2019. Please refer to that encounter for the full report. Briefly, results suggested primary impairments surrounding executive functioning. Additional impairments were seen across aspects of basic and complex attention, complex visuospatial functioning, and verbal learning and memory. Performance variability was seen across processing speed, verbal fluency, and confrontation naming. Overall, given evidence for significant cognitive dysfunction, coupled with Catherine Gonzales and her daughter's report of difficulties performing instrumental ADLs independently, she meets criteria for a major neurocognitive disorder (formerly "dementia") at the present time. The etiology of Catherine Gonzales' cognitive dysfunction is likely multifactorial in nature. I believe there is a vascular component to her presentation, given her medical history and profound difficulties with executive functioning tasks, as well as very low scores across tasks assessing both basic and complex attention. However, as the rate of cognitive decline appears more advanced (no prior testing is available for comparison purposes to confirm this suspicion) relative to a solely vascular presentation, it is possible that she is also experiencing Alzheimer's disease (also known as a mixed dementia presentation). Recommendations included a  repeat evaluation in 12-18 months, establishing care with a neurologist, and consideration of additional neuroimaging (e.g., brain MRI).  Catherine Gonzales was accompanied by her daughter. Content of the current session focused on the results of her evaluation and possible etiologies of cognitive deficits. Catherine Gonzales and her daughter were given the opportunity to ask questions and their questions were answered. They were also encouraged to reach out should additional questions arise. A copy of her report was provided at the conclusion of the visit.      A total of 20 minutes were spent with Catherine Gonzales during the current feedback session.

## 2019-11-18 ENCOUNTER — Other Ambulatory Visit: Payer: Self-pay | Admitting: Cardiology

## 2019-11-19 ENCOUNTER — Other Ambulatory Visit: Payer: Self-pay | Admitting: Pharmacist Clinician (PhC)/ Clinical Pharmacy Specialist

## 2019-11-19 MED ORDER — RIVAROXABAN 15 MG PO TABS: 15.0000 mg | ORAL_TABLET | Freq: Every day | ORAL | 1 refills | Status: DC

## 2019-11-19 NOTE — Telephone Encounter (Signed)
89f 85.9kg Scr 1.17(03/02/19) ccr 49.4 Lovw/crenshaw 06/12/19 Pt requesting 20mg  xarelto but only qualifies for the 15mg . Will need a pharmd review

## 2019-11-19 NOTE — Telephone Encounter (Signed)
Refill request for Xarelto 

## 2019-11-19 NOTE — Telephone Encounter (Signed)
85 F 85.9 kg, SCr 1.17  =. CrCl 49.4.    Spoke with daughter and explained need to decrease dose from 20 mg to 15 mg daily.  Daughter voiced understanding

## 2019-11-21 ENCOUNTER — Ambulatory Visit (INDEPENDENT_AMBULATORY_CARE_PROVIDER_SITE_OTHER): Payer: Medicare Other | Admitting: Podiatry

## 2019-11-21 ENCOUNTER — Other Ambulatory Visit: Payer: Self-pay

## 2019-11-21 DIAGNOSIS — M79676 Pain in unspecified toe(s): Secondary | ICD-10-CM

## 2019-11-21 DIAGNOSIS — B351 Tinea unguium: Secondary | ICD-10-CM | POA: Diagnosis not present

## 2019-11-21 NOTE — Progress Notes (Signed)
  Subjective:  Patient ID: Catherine Gonzales, female    DOB: [birth year],  MRN: [id number]  Chief Complaint  Patient presents with  . Nail Problem    Nail trim 1-5 bilateral    85 y.o. female presents with the above complaint.  Reports painfully elongated nails to both feet.  Review of Systems: Negative except as noted in the HPI. Denies N/V/F/Ch.  Past Medical History:  Diagnosis Date  . A-fib ([place])   . Atrial flutter ([place]) 01/14/2017  . Chronic kidney disease   . Diabetes mellitus type II, non insulin dependent ([place]) 01/14/2017  . DVT of lower extremity, bilateral ([place]): peroneal veins 01/15/2017  . Hypertension   . Major depressive disorder with psychotic features ([place])   . Major neurocognitive disorder ([place])   . Pulmonary embolus ([place]) 01/13/2017    Current Outpatient Medications:  .  atorvastatin (LIPITOR) 20 MG tablet, Take 1 tablet (20 mg total) by mouth daily at 6 PM., Disp: 30 tablet, Rfl: 0 .  Cholecalciferol (VITAMIN D) 125 MCG (5000 UT) CAPS, Take 1 tablet by mouth once a week., Disp: , Rfl:  .  FLUoxetine (PROZAC) 20 MG capsule, , Disp: , Rfl:  .  JANUVIA 100 MG tablet, , Disp: , Rfl:  .  magnesium 30 MG tablet, Take 250 mg by mouth daily., Disp: , Rfl:  .  nortriptyline (PAMELOR) 10 MG capsule, Take 10 mg by mouth at bedtime., Disp: , Rfl:  .  risperiDONE (RISPERDAL) 1 MG tablet, , Disp: , Rfl:  .  Rivaroxaban (XARELTO) 15 MG TABS tablet, Take 1 tablet (15 mg total) by mouth daily with supper., Disp: 90 tablet, Rfl: 1 .  TOPROL XL 25 MG 24 hr tablet, TAKE 3 TABLETS DAILY. TAKE WITH OR IMMEDIATELY FOLLOWING A MEAL, Disp: 270 tablet, Rfl: 0 .  vitamin B-12 (CYANOCOBALAMIN) 1000 MCG tablet, Take 1,000 mcg by mouth daily., Disp: , Rfl:  .  Vitamin D, Ergocalciferol, (DRISDOL) 1.25 MG (50000 UT) CAPS capsule, , Disp: , Rfl:   Social History   Tobacco Use  Smoking Status Never Smoker  Smokeless Tobacco Never Used    No Known Allergies Objective:  There were no vitals  filed for this visit. There is no height or weight on file to calculate BMI. Constitutional Well developed. Well nourished.  Vascular Dorsalis pedis pulses palpable bilaterally. Posterior tibial pulses palpable bilaterally. Capillary refill normal to all digits.  No cyanosis or clubbing noted. Pedal hair growth normal.  Neurologic Normal speech. Oriented to person, place, and time. Epicritic sensation to light touch grossly present bilaterally.  Dermatologic Nails elongated dystrophic pain to palpation No open wounds. No skin lesions.  Orthopedic: Normal joint ROM without pain or crepitus bilaterally. No visible deformities. No bony tenderness.   Radiographs: None Assessment:   1. Pain due to onychomycosis of nail    Plan:  Patient was evaluated and treated and all questions answered.  Onychomycosis with pain -Nails palliatively debridement as below -Educated on self-care  Procedure: Nail Debridement Rationale: Pain Type of Debridement: manual, sharp debridement. Instrumentation: Nail nipper, rotary burr. Number of Nails: 10    No follow-ups on file.

## 2019-12-08 NOTE — Progress Notes (Signed)
HPI: Follow-upatrial fibrillation/atrial flutter. Patient admitted January 2018 with acute pulmonary embolus and also noted to have atrial flutter. Lower extremity Dopplers also showed DVT.Amiodarone discontinued May 2018. Last echocardiogram February 2019 showed normal LV function, grade 1 diastolic dysfunction and mild left atrial enlargement. Patient underwent cardioversion of atrial flutter in the emergency room recently. Seen in follow-up in atrial fibrillation clinic and conservative management felt indicated. Since last seen, she denies dyspnea, chest pain, palpitations, syncope or bleeding.  Current Outpatient Medications  Medication Sig Dispense Refill  . atorvastatin (LIPITOR) 20 MG tablet Take 1 tablet (20 mg total) by mouth daily at 6 PM. 30 tablet 0  . Cholecalciferol (VITAMIN D) 125 MCG (5000 UT) CAPS Take 1 tablet by mouth once a week.    Marland Kitchen FLUoxetine (PROZAC) 20 MG capsule     . JANUVIA 100 MG tablet     . magnesium 30 MG tablet Take 250 mg by mouth daily.    . risperiDONE (RISPERDAL) 1 MG tablet Take 0.5 mg by mouth daily.     . Rivaroxaban (XARELTO) 15 MG TABS tablet Take 1 tablet (15 mg total) by mouth daily with supper. 90 tablet 1  . TOPROL XL 25 MG 24 hr tablet TAKE 3 TABLETS DAILY. TAKE WITH OR IMMEDIATELY FOLLOWING A MEAL 270 tablet 0  . vitamin B-12 (CYANOCOBALAMIN) 1000 MCG tablet Take 1,000 mcg by mouth daily.    . Vitamin D, Ergocalciferol, (DRISDOL) 1.25 MG (50000 UT) CAPS capsule      No current facility-administered medications for this visit.     Past Medical History:  Diagnosis Date  . A-fib ([place])   . Atrial flutter ([place]) 01/14/2017  . Chronic kidney disease   . Diabetes mellitus type II, non insulin dependent ([place]) 01/14/2017  . DVT of lower extremity, bilateral ([place]): peroneal veins 01/15/2017  . Hypertension   . Major depressive disorder with psychotic features ([place])   . Major neurocognitive disorder ([place])   . Pulmonary embolus ([place])  01/13/2017    Past Surgical History:  Procedure Laterality Date  . CHOLECYSTECTOMY      Social History   Socioeconomic History  . Marital status: Married    Spouse name: Not on file  . Number of children: Not on file  . Years of education: Not on file  . Highest education level: Not on file  Occupational History  . Not on file  Tobacco Use  . Smoking status: Never Smoker  . Smokeless tobacco: Never Used  Substance and Sexual Activity  . Alcohol use: No  . Drug use: No  . Sexual activity: Not on file  Other Topics Concern  . Not on file  Social History Narrative  . Not on file   Social Determinants of Health   Financial Resource Strain:   . Difficulty of Paying Living Expenses: Not on file  Food Insecurity:   . Worried About Charity fundraiser in the Last Year: Not on file  . Ran Out of Food in the Last Year: Not on file  Transportation Needs:   . Lack of Transportation (Medical): Not on file  . Lack of Transportation (Non-Medical): Not on file  Physical Activity:   . Days of Exercise per Week: Not on file  . Minutes of Exercise per Session: Not on file  Stress:   . Feeling of Stress : Not on file  Social Connections:   . Frequency of Communication with Friends and Family: Not on file  .  Frequency of Social Gatherings with Friends and Family: Not on file  . Attends Religious Services: Not on file  . Active Member of Clubs or Organizations: Not on file  . Attends Archivist Meetings: Not on file  . Marital Status: Not on file  Intimate Partner Violence:   . Fear of Current or Ex-Partner: Not on file  . Emotionally Abused: Not on file  . Physically Abused: Not on file  . Sexually Abused: Not on file    Family History  Problem Relation Age of Onset  . Deep vein thrombosis Neg Hx     ROS: no fevers or chills, productive cough, hemoptysis, dysphasia, odynophagia, melena, hematochezia, dysuria, hematuria, rash, seizure activity, orthopnea, PND, pedal  edema, claudication. Remaining systems are negative.  Physical Exam: Well-developed well-nourished in no acute distress.  Skin is warm and dry.  HEENT is normal.  Neck is supple.  Chest is clear to auscultation with normal expansion.  Cardiovascular exam is regular rate and rhythm.  Abdominal exam nontender or distended. No masses palpated. Extremities show no edema. neuro grossly intact  ECG-sinus rhythm at a rate of 58, cannot rule out prior inferior infarct.  Personally reviewed  A/P  1 paroxysmal atrial fibrillation-continue Toprol and Xarelto.  Patient does have dementia and we are being conservative if possible.  She has no symptoms when she has her atrial flutter based on her son's report.  They noticed this when they checked her heart rate.  They typically give her an additional Toprol and her atrial flutter resolves.  If she has more frequent episodes in the future we could consider resuming amiodarone.  Check hemoglobin and renal function.  2 hypertension-blood pressure controlled.  Continue present medications and follow.  3 history of pulmonary embolus-continue Xarelto.  4 chronic diastolic congestive heart failure-she is euvolemic today on examination.  Continue fluid restriction and low-sodium diet.  Kirk Ruths, MD

## 2019-12-11 ENCOUNTER — Other Ambulatory Visit: Payer: Self-pay

## 2019-12-11 ENCOUNTER — Telehealth: Payer: Self-pay | Admitting: Cardiology

## 2019-12-11 ENCOUNTER — Ambulatory Visit (INDEPENDENT_AMBULATORY_CARE_PROVIDER_SITE_OTHER): Payer: Medicare Other | Admitting: Cardiology

## 2019-12-11 ENCOUNTER — Encounter: Payer: Self-pay | Admitting: Cardiology

## 2019-12-11 VITALS — BP 124/66 | HR 58 | Temp 97.3°F | Ht 64.5 in | Wt 182.0 lb

## 2019-12-11 DIAGNOSIS — I480 Paroxysmal atrial fibrillation: Secondary | ICD-10-CM

## 2019-12-11 DIAGNOSIS — I5032 Chronic diastolic (congestive) heart failure: Secondary | ICD-10-CM

## 2019-12-11 DIAGNOSIS — I10 Essential (primary) hypertension: Secondary | ICD-10-CM | POA: Diagnosis not present

## 2019-12-11 NOTE — Patient Instructions (Signed)
Medication Instructions:  NO CHANGE *If you need a refill on your cardiac medications before your next appointment, please call your pharmacy*  Lab Work: Your physician recommends that you HAVE LAB WORK TODAY If you have labs (blood work) drawn today and your tests are completely normal, you will receive your results only by: . MyChart Message (if you have MyChart) OR . A paper copy in the mail If you have any lab test that is abnormal or we need to change your treatment, we will call you to review the results.  Follow-Up: At [facility], you and your health needs are our priority.  As part of our continuing mission to provide you with exceptional heart care, we have created designated Provider Care Teams.  These Care Teams include your primary Cardiologist (physician) and Advanced Practice Providers (APPs -  Physician Assistants and Nurse Practitioners) who all work together to provide you with the care you need, when you need it.  Your next appointment:   6 month(s)  The format for your next appointment:   Either In Person or Virtual  Provider:   You may see Brian [name], MD or one of the following Advanced Practice Providers on your designated Care Team:    Luke Kilroy, PA-C  Callie Goodrich, PA-C  Jesse Cleaver, FNP    

## 2019-12-11 NOTE — Telephone Encounter (Signed)
Spoke to patient's daughter. Patient had an appointment today  Patient's son brought patient to appointment   Daughter states patient only has 2 days worth of 20 mg of Xarelto. She states that Dr Stanford Breed  wanted to increase to Xarelto 20 mg from 15 mg daily.  But depending on labwork obtained today may change.  RN informed daughter that patient can use 15 mg until result are back an see if Dr Stanford Breed would like to increase dosing.

## 2019-12-11 NOTE — Telephone Encounter (Signed)
Agree with continuing xarelto 15 mg daily until lab results return Kirk Ruths

## 2019-12-11 NOTE — Telephone Encounter (Signed)
Patient calling the office for samples of medication:   1.  What medication and dosage are you requesting samples for? xarelto   2.  Are you currently out of this medication? Yes patient is out of medications.

## 2019-12-12 LAB — CBC
Hematocrit: 44.1 % (ref 34.0–46.6)
Hemoglobin: 14.3 g/dL (ref 11.1–15.9)
MCH: 28.5 pg (ref 26.6–33.0)
MCHC: 32.4 g/dL (ref 31.5–35.7)
MCV: 88 fL (ref 79–97)
Platelets: 194 10*3/uL (ref 150–450)
RBC: 5.01 x10E6/uL (ref 3.77–5.28)
RDW: 13.5 % (ref 11.7–15.4)
WBC: 8.2 10*3/uL (ref 3.4–10.8)

## 2019-12-12 LAB — BASIC METABOLIC PANEL
BUN/Creatinine Ratio: 24 (ref 12–28)
BUN: 21 mg/dL (ref 8–27)
CO2: 19 mmol/L — ABNORMAL LOW (ref 20–29)
Calcium: 9.4 mg/dL (ref 8.7–10.3)
Chloride: 105 mmol/L (ref 96–106)
Creatinine, Ser: 0.88 mg/dL (ref 0.57–1.00)
GFR calc Af Amer: 70 mL/min/{1.73_m2} (ref >59)
GFR calc non Af Amer: 61 mL/min/{1.73_m2} (ref >59)
Glucose: 151 mg/dL — ABNORMAL HIGH (ref 65–99)
Potassium: 4.4 mmol/L (ref 3.5–5.2)
Sodium: 141 mmol/L (ref 134–144)

## 2019-12-15 ENCOUNTER — Telehealth: Payer: Self-pay

## 2019-12-15 IMAGING — DX DG CHEST 1V PORT
1 series · 1 of 1 positions shown · non-contrast
Comparison: January 16, 2017

CLINICAL DATA: Atrial fibrillation with tachycardia

EXAM:
PORTABLE CHEST 1 VIEW

[chest]
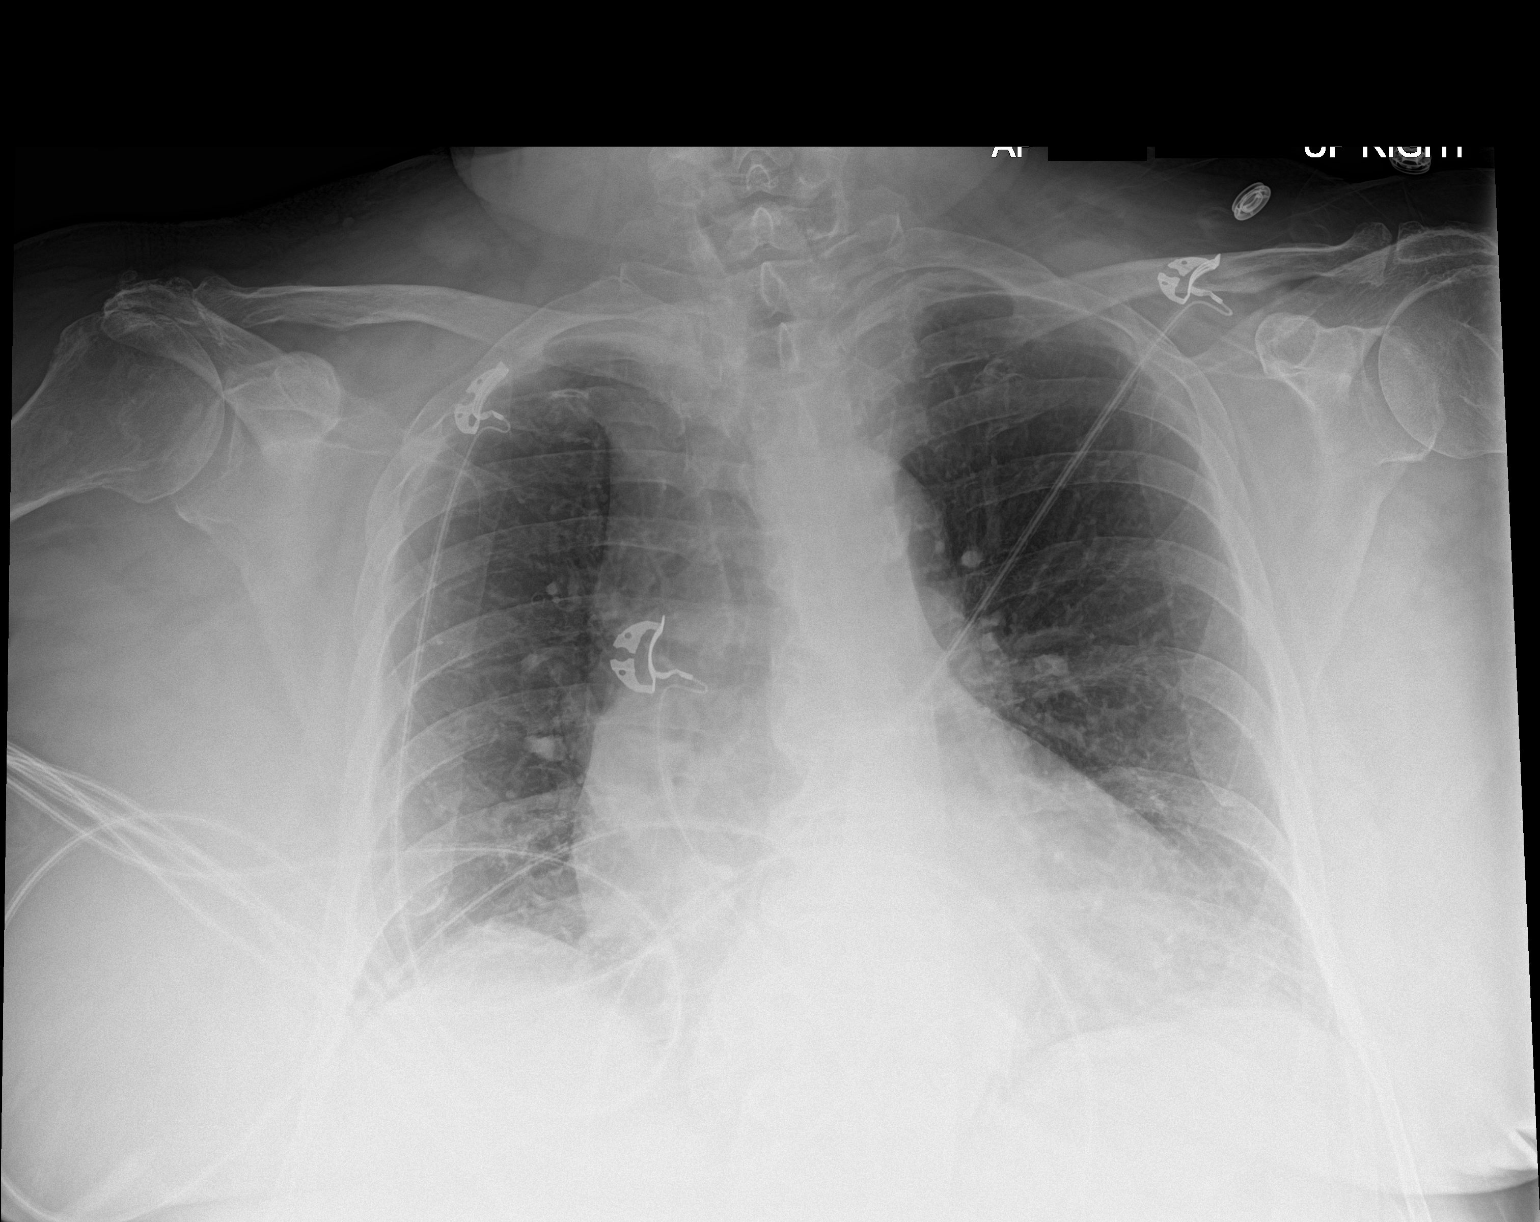

[1 of 1 positions shown; findings below may reference images not displayed]

FINDINGS: No edema or consolidation. Heart is mildly enlarged with pulmonary
vascularity normal. No adenopathy. There is aortic atherosclerosis.
There is degenerative change in each shoulder.
IMPRESSION: Mild cardiac enlargement. There is aortic atherosclerosis. No edema
or consolidation.

Aortic Atherosclerosis (CM37E-BV6.6).

## 2019-12-15 MED ORDER — RIVAROXABAN 20 MG PO TABS: 20.0000 mg | ORAL_TABLET | Freq: Every day | ORAL | 11 refills | Status: DC

## 2019-12-15 NOTE — Telephone Encounter (Signed)
Spoke with patient, results reviewed. Medication changed in Epic to 20mg  per MD order. Please see other epic note for details.

## 2019-12-15 NOTE — Telephone Encounter (Signed)
-----   Message from Lelon Perla, MD sent at 12/12/2019  5:29 AM EST ----- Continue xarelto at 20 mg daily Kirk Ruths

## 2019-12-22 ENCOUNTER — Telehealth: Payer: Self-pay | Admitting: Cardiology

## 2019-12-22 MED ORDER — RIVAROXABAN 20 MG PO TABS: 20.0000 mg | ORAL_TABLET | Freq: Every day | ORAL | 3 refills | Status: DC

## 2019-12-22 NOTE — Telephone Encounter (Signed)
Patient's daughter would like a copy of her mother's recent lab results faxed to Dr. Brigitte Pulse at [phone], Attn: Dr. Serita Grammes.  Please call daughter to let her know they have been faxed.

## 2019-12-22 NOTE — Telephone Encounter (Signed)
I spoke with pt's son and reviewed lab results with him.  Son aware medication change based on lab work is for [place].  Dose is now Xarelto 20 mg daily. I let son know prescription was sent to [facility].  He will pick this up and requests 90 day supply be sent to [facility].  Will send in.

## 2019-12-22 NOTE — Telephone Encounter (Signed)
BMP and CBC faxed through [facility]. Daughter notified

## 2019-12-22 NOTE — Telephone Encounter (Signed)
Patient's son calling for lab results. He would also like to know if the patient's metoprolol will be changed from 15mg  to 20mg .

## 2019-12-30 ENCOUNTER — Encounter: Payer: Self-pay | Admitting: Neurology

## 2020-01-26 ENCOUNTER — Other Ambulatory Visit: Payer: Self-pay | Admitting: Cardiology

## 2020-02-09 ENCOUNTER — Telehealth: Payer: Self-pay | Admitting: Cardiology

## 2020-02-09 MED ORDER — METOPROLOL SUCCINATE ER 25 MG PO TB24: ORAL_TABLET | ORAL | 0 refills | Status: DC

## 2020-02-09 NOTE — Telephone Encounter (Signed)
New Message  Pt c/o medication issue:  1. Name of Medication: TOPROL XL 25 MG 24 hr tablet  2. How are you currently taking this medication (dosage and times per day)? As written  3. Are you having a reaction (difficulty breathing--STAT)? No  4. What is your medication issue? Medication is currently out of refills. Pt needs new prescription for medication to be filled.

## 2020-02-09 NOTE — Telephone Encounter (Signed)
Refilled Rx. Called and LVM for pt making them aware.

## 2020-02-24 DIAGNOSIS — L309 Dermatitis, unspecified: Secondary | ICD-10-CM | POA: Diagnosis not present

## 2020-03-12 ENCOUNTER — Encounter: Payer: Self-pay | Admitting: Neurology

## 2020-03-12 ENCOUNTER — Other Ambulatory Visit: Payer: Self-pay

## 2020-03-12 ENCOUNTER — Ambulatory Visit (INDEPENDENT_AMBULATORY_CARE_PROVIDER_SITE_OTHER): Payer: Medicare Other | Admitting: Neurology

## 2020-03-12 VITALS — BP 123/69 | HR 62 | Ht 64.5 in | Wt 179.8 lb

## 2020-03-12 DIAGNOSIS — F0150 Vascular dementia without behavioral disturbance: Secondary | ICD-10-CM

## 2020-03-12 DIAGNOSIS — F0390 Unspecified dementia without behavioral disturbance: Secondary | ICD-10-CM

## 2020-03-12 DIAGNOSIS — F22 Delusional disorders: Secondary | ICD-10-CM

## 2020-03-12 MED ORDER — MEMANTINE HCL 10 MG PO TABS: ORAL_TABLET | ORAL | 5 refills | Status: DC

## 2020-03-12 NOTE — Patient Instructions (Addendum)
1. Schedule open MRI brain without contrast  2. Recommend starting Memantine (Namenda) 10mg : take 1 tablet every night for 2 weeks, then increase to 1 tablet twice a day  3. Continue close supervision

## 2020-03-12 NOTE — Progress Notes (Signed)
NEUROLOGY CONSULTATION NOTE  Catherine Gonzales MRN: [id number] DOB: 05/09/1934  Referring provider: Dr. Mayra Neer Primary care provider: Dr. Mayra Neer  Reason for consult:  Dementia with behavioral disturbance  Dear Dr Brigitte Pulse:  Thank you for your kind referral of Catherine Gonzales for consultation of the above symptoms. Although her history is well known to you, please allow me to reiterate it for the purpose of our medical record. The patient was accompanied to the clinic by her daughter Catherine Gonzales who also provides collateral information. Records and images were personally reviewed where available.   HISTORY OF PRESENT ILLNESS: This is an 86 year old right-handed woman with a history of hypertension, hyperlipidemia, diabetes, MDD with psychotic features, mixed dementia, presenting to establish care. Her daughter Catherine Gonzales is present during the visit to provide additional information. She feels her memory is pretty good for her age. Catherine Gonzales reports that since Catherine Gonzales was a child, she has struggled through the years with paranoia and intermittently treated with Risperdal and nortriptyline, which took care of it. Around July 2019, she started having abnormal thinking again with hallucinations and fixed delusions. She would not believe that things have occurred or that she did something, even if it had happened. Catherine Gonzales cites several instances where she would say she did not know where all the laundry comes from, when she had done the laundry. Yesterday she set off the house alarm, Catherine Gonzales was called by the [place] and she did not answer the phone, she said she did not know how it happened and that she did not do it. She was also started on Memantine in September 2019 which she took for a couple of months but did not seem to do anything, she continued with really crazy talk. She was then restarted on Risperdal in September 2019 and the "out there thoughts and talking improved," but now she just sits and stares a  lot. Catherine Gonzales would be talking to her on the phone and repeat 2-3 times because there is just silence. It seems like she is hearing but not listening, with a disconnect with what is going on. She used to like watching game shows but now hardly turns the TV on, just sitting on the chair. She used to love to talk but now she hardly smiles and there is no conversation, the bubbly personality she had has changed. Risperdal dose was cut in half with no improvement. She is able to bathe and dress herself, with matching jewelry, able to fix her breakfast. She denies leaving the stove on. Her family come daily and set her pills out, she denies missing medications. She reports her mood is good, she states she does not get upset easily.   Catherine Gonzales reports that her grandson died in 12-02-2017, then 6 months later in July 2019 all the symptoms started. She is not sure if this triggered what has been going throughout her lifetime. The last time she was on Risperdal was 3-4 years ago which she took for 6 months. She has had a tremor even before starting the Risperdal last July 2019. Her handwriting has changed, it does not affect using utensils when eating.  She denies any headaches, dizziness, vision changes, dysarthria/dysphagia, neck/back pain, focal numbness/tingling/weakness, bowel/bladder dysfunction, anosmia, no falls. Sleep is good.   She had Neurocognitive testing in October 2020 which was reviewed today. Primary impairments were seen with executive functioning, as well as "aspects of basic and complex attention, complex visuospatial functioning, and verbal learning and  memory. Performance variability was seen across processing speed, verbal fluency, and confrontation naming. Overall, given evidence for significant cognitive dysfunction, coupled with Catherine Gonzales and her daughter's report of difficulties performing instrumental ADLs independently, she meets criteria for a Major Neurocognitive Disorder (formerly "dementia")  at the present time." Etiology likely multifactorial, it was felt there is a vascular component, but also very possible Alzheimer's presentation (ie mixed dementia). There was no indication of Parkinson's disease dementia presentation. Although she did have significant psychiatric distress, current deficits were felt to be beyond typically seen from psychiatric cause.    PAST MEDICAL HISTORY: Past Medical History:  Diagnosis Date  . A-fib ([place])   . Atrial flutter ([place]) 01/14/2017  . Chronic kidney disease   . Diabetes mellitus type II, non insulin dependent ([place]) 01/14/2017  . DVT of lower extremity, bilateral ([place]): peroneal veins 01/15/2017  . Hypertension   . Major depressive disorder with psychotic features ([place])   . Major neurocognitive disorder ([place])   . Pulmonary embolus ([place]) 01/13/2017    PAST SURGICAL HISTORY: Past Surgical History:  Procedure Laterality Date  . CHOLECYSTECTOMY      MEDICATIONS: Current Outpatient Medications on File Prior to Visit  Medication Sig Dispense Refill  . atorvastatin (LIPITOR) 20 MG tablet Take 1 tablet (20 mg total) by mouth daily at 6 PM. 30 tablet 0  . Cholecalciferol (VITAMIN D) 125 MCG (5000 UT) CAPS Take 1 tablet by mouth once a week.    Marland Kitchen FLUoxetine (PROZAC) 20 MG capsule     . JANUVIA 100 MG tablet     . magnesium 30 MG tablet Take 250 mg by mouth daily.    . metoprolol succinate (TOPROL XL) 25 MG 24 hr tablet TAKE 3 TABLETS DAILY. TAKE WITH OR IMMEDIATELY FOLLOWING A MEAL 270 tablet 0  . risperiDONE (RISPERDAL) 1 MG tablet Take 0.5 mg by mouth daily.     . rivaroxaban (XARELTO) 20 MG TABS tablet Take 1 tablet (20 mg total) by mouth daily with supper. 90 tablet 3  . vitamin B-12 (CYANOCOBALAMIN) 1000 MCG tablet Take 1,000 mcg by mouth daily.    . Vitamin D, Ergocalciferol, (DRISDOL) 1.25 MG (50000 UT) CAPS capsule      No current facility-administered medications on file prior to visit.    ALLERGIES: No Known Allergies  FAMILY  HISTORY: Family History  Problem Relation Age of Onset  . Deep vein thrombosis Neg Hx     SOCIAL HISTORY: Social History   Socioeconomic History  . Marital status: Married    Spouse name: Not on file  . Number of children: Not on file  . Years of education: Not on file  . Highest education level: Not on file  Occupational History  . Not on file  Tobacco Use  . Smoking status: Never Smoker  . Smokeless tobacco: Never Used  Substance and Sexual Activity  . Alcohol use: No  . Drug use: No  . Sexual activity: Not on file  Other Topics Concern  . Not on file  Social History Narrative  . Not on file   Social Determinants of Health   Financial Resource Strain:   . Difficulty of Paying Living Expenses:   Food Insecurity:   . Worried About Charity fundraiser in the Last Year:   . Arboriculturist in the Last Year:   Transportation Needs:   . Film/video editor (Medical):   Marland Kitchen Lack of Transportation (Non-Medical):   Physical Activity:   .  Days of Exercise per Week:   . Minutes of Exercise per Session:   Stress:   . Feeling of Stress :   Social Connections:   . Frequency of Communication with Friends and Family:   . Frequency of Social Gatherings with Friends and Family:   . Attends Religious Services:   . Active Member of Clubs or Organizations:   . Attends Archivist Meetings:   Marland Kitchen Marital Status:   Intimate Partner Violence:   . Fear of Current or Ex-Partner:   . Emotionally Abused:   Marland Kitchen Physically Abused:   . Sexually Abused:     REVIEW OF SYSTEMS: Constitutional: No fevers, chills, or sweats, no generalized fatigue, change in appetite Eyes: No visual changes, double vision, eye pain Ear, nose and throat: No hearing loss, ear pain, nasal congestion, sore throat Cardiovascular: No chest pain, palpitations Respiratory:  No shortness of breath at rest or with exertion, wheezes GastrointestinaI: No nausea, vomiting, diarrhea, abdominal pain, fecal  incontinence Genitourinary:  No dysuria, urinary retention or frequency Musculoskeletal:  No neck pain, back pain Integumentary: No rash, pruritus, skin lesions Neurological: as above Psychiatric: No depression, insomnia, anxiety Endocrine: No palpitations, fatigue, diaphoresis, mood swings, change in appetite, change in weight, increased thirst Hematologic/Lymphatic:  No anemia, purpura, petechiae. Allergic/Immunologic: no itchy/runny eyes, nasal congestion, recent allergic reactions, rashes  PHYSICAL EXAM: Vitals:   03/12/20 1043  BP: 123/69  Pulse: 62  SpO2: 96%   General: No acute distress, flat affect Head:  Normocephalic/atraumatic Skin/Extremities: No rash, no edema Neurological Exam: Mental status: alert and oriented to person, place, and time, no dysarthria or aphasia, Fund of knowledge is appropriate.  Recent and remote memory are impaired.  Attention and concentration are reduced.  Cranial nerves: CN I: not tested CN II: pupils equal, round and reactive to light, visual fields intact CN III, IV, VI:  full range of motion, no nystagmus, no ptosis CN V: facial sensation intact CN VII: upper and lower face symmetric CN VIII: hearing intact to conversation Bulk & Tone: normal, no fasciculations, +cogwheeling on left Motor: 5/5 throughout with no pronator drift. Sensation: intact to light touch, cold, pin, vibration and joint position sense.  No extinction to double simultaneous stimulation.  Romberg test negative Deep Tendon Reflexes: +2 throughout, no ankle clonus Cerebellar: no incoordination on finger to nose testing Gait: narrow-based, no ataxia. She has a left hand tremor with ambulation Tremor: bilateral resting tremor L>R Good finger and foot taps, good RAMS, negative pull test   IMPRESSION: This is an 86 year old right-handed woman with a history of  hypertension, hyperlipidemia, diabetes, MDD with psychotic features, mixed dementia, presenting to establish  care. Recent Neuropsychological testing in October 2020 indicated dementia, likely multifactorial (ie mixed features with vascular and Alzheimer's disease). Her neurological exam shows a resting tremor L>R and tremor on ambulation, however no other signs to fulfill criteria for Parkinson's disease. 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 Her daughter has raised concern about her change in personality, which may be medication-related as well, however we also discussed how personality changes can occur with dementia, and in her case, complicated by her psychiatric history. I discussed Neuropsychological testing report indicating that her current deficits on testing were felt to be beyond typically seen from psychiatric cause. Low suspicion for seizures. I discussed brain imaging with MRI brain without contrast, she does not feel she needs this but appeared to agree with testing. We discussed restarting Memantine 10mg  qhs for 2 weeks, then  increasing to 10mg  BID. A mood stabilizing agent/SSRI would likely be beneficial for her as well. Continue close supervision. Follow-up in 4 months, they know to call for any changes.   Thank you for allowing me to participate in the care of this patient. Please do not hesitate to call for any questions or concerns.   Ellouise Newer, M.D.  CC: Dr. Brigitte Pulse

## 2020-03-26 ENCOUNTER — Other Ambulatory Visit: Payer: Self-pay

## 2020-03-26 ENCOUNTER — Ambulatory Visit (INDEPENDENT_AMBULATORY_CARE_PROVIDER_SITE_OTHER): Payer: Medicare Other | Admitting: Podiatry

## 2020-03-26 DIAGNOSIS — B351 Tinea unguium: Secondary | ICD-10-CM | POA: Diagnosis not present

## 2020-03-26 DIAGNOSIS — D689 Coagulation defect, unspecified: Secondary | ICD-10-CM

## 2020-03-31 DIAGNOSIS — E1122 Type 2 diabetes mellitus with diabetic chronic kidney disease: Secondary | ICD-10-CM | POA: Diagnosis not present

## 2020-03-31 DIAGNOSIS — E538 Deficiency of other specified B group vitamins: Secondary | ICD-10-CM | POA: Diagnosis not present

## 2020-03-31 DIAGNOSIS — D6869 Other thrombophilia: Secondary | ICD-10-CM | POA: Diagnosis not present

## 2020-03-31 DIAGNOSIS — F0390 Unspecified dementia without behavioral disturbance: Secondary | ICD-10-CM | POA: Diagnosis not present

## 2020-03-31 DIAGNOSIS — I129 Hypertensive chronic kidney disease with stage 1 through stage 4 chronic kidney disease, or unspecified chronic kidney disease: Secondary | ICD-10-CM | POA: Diagnosis not present

## 2020-03-31 DIAGNOSIS — F322 Major depressive disorder, single episode, severe without psychotic features: Secondary | ICD-10-CM | POA: Diagnosis not present

## 2020-03-31 DIAGNOSIS — F29 Unspecified psychosis not due to a substance or known physiological condition: Secondary | ICD-10-CM | POA: Diagnosis not present

## 2020-03-31 DIAGNOSIS — I2782 Chronic pulmonary embolism: Secondary | ICD-10-CM | POA: Diagnosis not present

## 2020-03-31 DIAGNOSIS — I4892 Unspecified atrial flutter: Secondary | ICD-10-CM | POA: Diagnosis not present

## 2020-04-06 NOTE — Progress Notes (Signed)
  Subjective:  Patient ID: Catherine Gonzales, female    DOB: January 25, 1934,  MRN: [id number]  Chief Complaint  Patient presents with  . debride    diabetic nail tirmming  . Diabetes    FBS; dont check A1C: 6 PCP: Brigitte Pulse x couple mo    86 y.o. female presents with the above complaint. History confirmed with patient.   Objective:  Physical Exam: warm, good capillary refill, nail exam onychomycosis of the toenails, no trophic changes or ulcerative lesions. DP pulses palpable, PT pulses palpable and protective sensation intact Left Foot: normal exam, no swelling, tenderness, instability; ligaments intact, full range of motion of all ankle/foot joints  Right Foot: normal exam, no swelling, tenderness, instability; ligaments intact, full range of motion of all ankle/foot joints   No images are attached to the encounter.  Assessment:   1. Onychomycosis   2. Coagulation defect [facility])    Plan:  Patient was evaluated and treated and all questions answered.  Onychomycosis -Nails palliatively debrided secondary to pain   Procedure: Nail Debridement Rationale: Patient meets criteria for routine foot care due to coag defect Type of Debridement: manual, sharp debridement. Instrumentation: Nail nipper, rotary burr. Number of Nails: 10  No follow-ups on file.

## 2020-04-16 ENCOUNTER — Ambulatory Visit
Admission: RE | Admit: 2020-04-16 | Discharge: 2020-04-16 | Disposition: A | Discharge status: Home / Self Care | Payer: Medicare Other | Source: Ambulatory Visit | Attending: Neurology | Admitting: Neurology

## 2020-04-16 ENCOUNTER — Other Ambulatory Visit: Payer: Self-pay

## 2020-04-16 DIAGNOSIS — F0390 Unspecified dementia without behavioral disturbance: Secondary | ICD-10-CM

## 2020-04-16 DIAGNOSIS — F09 Unspecified mental disorder due to known physiological condition: Secondary | ICD-10-CM | POA: Diagnosis not present

## 2020-04-16 DIAGNOSIS — F22 Delusional disorders: Secondary | ICD-10-CM

## 2020-04-16 DIAGNOSIS — G3184 Mild cognitive impairment, so stated: Secondary | ICD-10-CM | POA: Diagnosis not present

## 2020-04-16 DIAGNOSIS — F0150 Vascular dementia without behavioral disturbance: Secondary | ICD-10-CM

## 2020-04-23 ENCOUNTER — Telehealth: Payer: Self-pay | Admitting: Neurology

## 2020-04-23 DIAGNOSIS — R9389 Abnormal findings on diagnostic imaging of other specified body structures: Secondary | ICD-10-CM

## 2020-04-23 NOTE — Telephone Encounter (Signed)
Pls let daughter know that the MRI brain did not show any evidence of tumor, stroke, or bleed. It showed age-related changes and brain shrinkage consistent with that seen in patients with Alzheimer's disease. There was an incidental finding (meaning the brain scan was done for something else, and this was seen) on the left temple in her scalp area (not brain) that radiologist recommended an MRI with contrast to further evaluate. If she is agreeable, pls order MRI brain with contrast, Dx: soft tissue abnormality left temporalis region on prior MRI brain. Thanks

## 2020-04-23 NOTE — Telephone Encounter (Signed)
Left message with the after hour service on 04-23-20 @ 12:04    Patient wants the results of the MRI

## 2020-04-23 NOTE — Telephone Encounter (Signed)
Spoke to pt daughter MRI brain did not show any evidence of tumor, stroke, or bleed. It showed age-related changes and brain shrinkage consistent with that seen in patients with Alzheimer's disease. There was an incidental finding (meaning the brain scan was done for something else, and this was seen) on the left temple in her scalp area (not brain) that radiologist recommended an MRI with contrast to further evaluate.  Daughter is ok with MRI with Contrast order placed in Epic,  Daughter asking for copy for MRI to be mailed to her copy placed in mail.

## 2020-04-23 NOTE — Addendum Note (Signed)
Addended by: Jake Seats on: 04/23/2020 04:35 PM   Modules accepted: Orders

## 2020-04-28 ENCOUNTER — Telehealth: Payer: Self-pay | Admitting: Neurology

## 2020-04-28 NOTE — Telephone Encounter (Signed)
Sent report and daugher notified.

## 2020-04-28 NOTE — Telephone Encounter (Signed)
Please see MRI, is it okay to release to her PCP?

## 2020-04-28 NOTE — Telephone Encounter (Signed)
Ok to send to her PCP, thanks

## 2020-04-28 NOTE — Telephone Encounter (Signed)
Pts daughter requesting MRI results also be sent to pts PCP, Dr. Mayra Neer.

## 2020-05-26 ENCOUNTER — Other Ambulatory Visit: Payer: Self-pay | Admitting: Family Medicine

## 2020-05-26 DIAGNOSIS — F0390 Unspecified dementia without behavioral disturbance: Secondary | ICD-10-CM | POA: Diagnosis not present

## 2020-05-26 DIAGNOSIS — L858 Other specified epidermal thickening: Secondary | ICD-10-CM | POA: Diagnosis not present

## 2020-05-26 DIAGNOSIS — M799 Soft tissue disorder, unspecified: Secondary | ICD-10-CM | POA: Diagnosis not present

## 2020-05-26 DIAGNOSIS — D0461 Carcinoma in situ of skin of right upper limb, including shoulder: Secondary | ICD-10-CM | POA: Diagnosis not present

## 2020-05-26 DIAGNOSIS — F322 Major depressive disorder, single episode, severe without psychotic features: Secondary | ICD-10-CM | POA: Diagnosis not present

## 2020-06-05 ENCOUNTER — Other Ambulatory Visit: Payer: Self-pay

## 2020-06-05 ENCOUNTER — Ambulatory Visit
Admission: RE | Admit: 2020-06-05 | Discharge: 2020-06-05 | Disposition: A | Discharge status: Home / Self Care | Payer: Medicare Other | Source: Ambulatory Visit | Attending: Neurology | Admitting: Neurology

## 2020-06-05 DIAGNOSIS — R9389 Abnormal findings on diagnostic imaging of other specified body structures: Secondary | ICD-10-CM

## 2020-06-05 MED ORDER — GADOBENATE DIMEGLUMINE 529 MG/ML IV SOLN
17.0000 mL | Freq: Once | INTRAVENOUS | Status: AC | PRN
  Administered 2020-06-05: 17 mL via INTRAVENOUS

## 2020-06-10 ENCOUNTER — Telehealth: Payer: Self-pay

## 2020-06-10 ENCOUNTER — Telehealth: Payer: Self-pay | Admitting: Neurology

## 2020-06-10 NOTE — Telephone Encounter (Signed)
-----   Message from Cameron Sprang, MD sent at 06/10/2020  9:33 AM EDT ----- Pls let daughter know the MRI was done as a follow-up to evaluate the  changes on the scalp on her left temple, the contrast study shows that this is an enlarged vessel in the scalp, nothing concerning at this time, it is not a tumor or anything worrisome. Thanks

## 2020-06-10 NOTE — Telephone Encounter (Signed)
Patient's daughter called for the patient's recent MRI results from 06/05/20.

## 2020-06-10 NOTE — Telephone Encounter (Signed)
Tried calling pt daughter back no answer unable to leave voice mail at [phone] number

## 2020-06-10 NOTE — Telephone Encounter (Signed)
Pls relay Result Note, thanks!

## 2020-06-10 NOTE — Telephone Encounter (Signed)
Pt daughter called no answer voice mail left for to call back

## 2020-06-10 NOTE — Telephone Encounter (Signed)
Spoke to pt daughter informed her that MRI was done as a follow-up to evaluate the changes on the scalp on her left temple, the contrast study shows that this is an enlarged vessel in the scalp, nothing concerning at this time, it is not a tumor or anything worrisome.

## 2020-06-10 NOTE — Telephone Encounter (Signed)
See result note.  

## 2020-06-25 ENCOUNTER — Other Ambulatory Visit: Payer: Self-pay

## 2020-06-25 ENCOUNTER — Ambulatory Visit (INDEPENDENT_AMBULATORY_CARE_PROVIDER_SITE_OTHER): Payer: Medicare Other | Admitting: Podiatry

## 2020-06-25 DIAGNOSIS — D689 Coagulation defect, unspecified: Secondary | ICD-10-CM

## 2020-06-25 DIAGNOSIS — B351 Tinea unguium: Secondary | ICD-10-CM

## 2020-06-25 NOTE — Progress Notes (Signed)
  Subjective:  Patient ID: Catherine Gonzales, female    DOB: [birth year],  MRN: [id number]  Chief Complaint  Patient presents with  . Nail Problem    Nail trim 1-5 bilateral    86 y.o. female presents with the above complaint. History confirmed with patient. On Xarelto  Objective:  Physical Exam: warm, good capillary refill, nail exam onychomycosis of the toenails, no trophic changes or ulcerative lesions. DP pulses palpable, PT pulses palpable and protective sensation intact Left Foot: normal exam, no swelling, tenderness, instability; ligaments intact, full range of motion of all ankle/foot joints  Right Foot: normal exam, no swelling, tenderness, instability; ligaments intact, full range of motion of all ankle/foot joints   No images are attached to the encounter.  Assessment:   1. Onychomycosis   2. Coagulation defect [facility])    Plan:  Patient was evaluated and treated and all questions answered.  Onychomycosis -Nails palliatively debrided secondary to pain. On Xarelto   Procedure: Nail Debridement Rationale: Patient meets criteria for routine foot care due to coag defect Type of Debridement: manual, sharp debridement. Instrumentation: Nail nipper, rotary burr. Number of Nails: 10   Return in about 3 months (around 09/25/2020) for Routine Foot Care - on Anticoagulant.

## 2020-07-05 ENCOUNTER — Other Ambulatory Visit: Payer: Self-pay | Admitting: Cardiology

## 2020-07-05 MED ORDER — METOPROLOL SUCCINATE ER 25 MG PO TB24: ORAL_TABLET | ORAL | 1 refills | Status: DC

## 2020-07-05 NOTE — Telephone Encounter (Signed)
Rx(s) sent to pharmacy electronically.  

## 2020-07-05 NOTE — Telephone Encounter (Signed)
   *  STAT* If patient is at the pharmacy, call can be transferred to refill team.   1. Which medications need to be refilled? (please list name of each medication and dose if known)   metoprolol succinate (TOPROL XL) 25 MG 24 hr tablet    2. Which pharmacy/location (including street and city if local pharmacy) is medication to be sent to? [facility] [place], [place]  3. Do they need a 30 day or 90 day supply? 90 days

## 2020-07-23 ENCOUNTER — Encounter: Payer: Self-pay | Admitting: Neurology

## 2020-07-23 ENCOUNTER — Ambulatory Visit (INDEPENDENT_AMBULATORY_CARE_PROVIDER_SITE_OTHER): Payer: Medicare Other | Admitting: Neurology

## 2020-07-23 ENCOUNTER — Other Ambulatory Visit: Payer: Self-pay

## 2020-07-23 VITALS — BP 151/78 | HR 60 | Ht 64.5 in | Wt 170.4 lb

## 2020-07-23 DIAGNOSIS — F0390 Unspecified dementia without behavioral disturbance: Secondary | ICD-10-CM

## 2020-07-23 DIAGNOSIS — F22 Delusional disorders: Secondary | ICD-10-CM | POA: Diagnosis not present

## 2020-07-23 DIAGNOSIS — F323 Major depressive disorder, single episode, severe with psychotic features: Secondary | ICD-10-CM

## 2020-07-23 DIAGNOSIS — F0150 Vascular dementia without behavioral disturbance: Secondary | ICD-10-CM | POA: Diagnosis not present

## 2020-07-23 NOTE — Patient Instructions (Signed)
1. Schedule repeat Neurocognitive testing for October 2021  2. Schedule routine EEG  3. Reduce Namenda to 10mg  every night.   4. Check on prior mood medications she has tried and please call in list to our office, we can consider starting a different one in a month  5. Continue close supervision

## 2020-07-23 NOTE — Progress Notes (Signed)
NEUROLOGY FOLLOW UP OFFICE NOTE  Catherine Gonzales [id number] 17-Feb-1934  HISTORY OF PRESENT ILLNESS: I had the pleasure of seeing Catherine Gonzales in follow-up in the neurology clinic on 07/23/2020.  The patient was last seen 5 months ago for mixed dementia with behavioral disturbance. She is again accompanied by her daughter Catherine Gonzales who helps supplement the history today.  Records and images were personally reviewed where available.  I personally reviewed MRI brain with and without contrast which did not show any acute changes. There was moderate diffuse atrophy, left greater than right hippocampal atrophy, moderate chronic microvascular disease. There was note of a soft tissue lesion overlying the left temporalis muscle, contrast study indicated an enlarged vessel courses through, which could be a small vascular malformation, not felt to be a tumor or more worrisome etiology.   The patient repeatedly says there is nothing wrong with her. Her daughter is worried that there is something potentially treatable that she is missing. She was started on Memantine 10mg  BID on last visit, Catherine Gonzales is not sure if this has worsened things, but there is a lot of disconnect, especially on the phone. Catherine Gonzales would have to ask her if she heard her. Catherine Gonzales continues to notice a lot of staring. [name] reports symptoms are not typical for AD patients, she does not repeat herself. She manages her medications with her family calling her for reminders. She does not drive. [name] mostly notices symptoms similar to her prior psychiatric flares that previously would respond to medication changes, but with psych med changes, there has been no change. There is still a lot of paranoia. She is not the bubbly person she used to be. [name] recalls trying Prozac and a few other "happy pills" with Dr. Brigitte Pulse with no change. She has not been back to see Dr. Casimiro Needle. She remains on Risperdal 0.5mg  daily.   History on Initial Assessment 03/12/2020: This is an 86 year  old right-handed woman with a history of hypertension, hyperlipidemia, diabetes, MDD with psychotic features, mixed dementia, presenting to establish care. Her daughter Catherine Gonzales is present during the visit to provide additional information. She feels her memory is pretty good for her age. Catherine Gonzales reports that since Catherine Gonzales was a child, she has struggled through the years with paranoia and intermittently treated with Risperdal and nortriptyline, which took care of it. Around July 2019, she started having abnormal thinking again with hallucinations and fixed delusions. She would not believe that things have occurred or that she did something, even if it had happened. Catherine Gonzales cites several instances where she would say she did not know where all the laundry comes from, when she had done the laundry. Yesterday she set off the house alarm, Catherine Gonzales was called by the [facility] and she did not answer the phone, she said she did not know how it happened and that she did not do it. She was also started on Memantine in September 2019 which she took for a couple of months but did not seem to do anything, she continued with really crazy talk. She was then restarted on Risperdal in September 2019 and the "out there thoughts and talking improved," but now she just sits and stares a lot. Catherine Gonzales would be talking to her on the phone and repeat 2-3 times because there is just silence. It seems like she is hearing but not listening, with a disconnect with what is going on. She used to like watching game shows but now hardly turns the TV on,  just sitting on the chair. She used to love to talk but now she hardly smiles and there is no conversation, the bubbly personality she had has changed. Risperdal dose was cut in half with no improvement. She is able to bathe and dress herself, with matching jewelry, able to fix her breakfast. She denies leaving the stove on. Her family come daily and set her pills out, she denies missing medications. She reports her  mood is good, she states she does not get upset easily.   Catherine Gonzales reports that her grandson died in 2017/11/20, then 6 months later in July 2019 all the symptoms started. She is not sure if this triggered what has been going throughout her lifetime. The last time she was on Risperdal was 3-4 years ago which she took for 6 months. She has had a tremor even before starting the Risperdal last July 2019. Her handwriting has changed, it does not affect using utensils when eating.  She denies any headaches, dizziness, vision changes, dysarthria/dysphagia, neck/back pain, focal numbness/tingling/weakness, bowel/bladder dysfunction, anosmia, no falls. Sleep is good.   She had Neurocognitive testing in October 2020 which was reviewed today. Primary impairments were seen with executive functioning, as well as "aspects of basic and complex attention, complex visuospatial functioning, and verbal learning and memory. Performance variability was seen across processing speed, verbal fluency, and confrontation naming. Overall, given evidence for significant cognitive dysfunction, coupled with Ms. Faeth and her daughter's report of difficulties performing instrumental ADLs independently, she meets criteria for a Major Neurocognitive Disorder (formerly "dementia") at the present time." Etiology likely multifactorial, it was felt there is a vascular component, but also very possible Alzheimer's presentation (ie mixed dementia). There was no indication of Parkinson's disease dementia presentation. Although she did have significant psychiatric distress, current deficits were felt to be beyond typically seen from psychiatric cause.    PAST MEDICAL HISTORY: Past Medical History:  Diagnosis Date  . A-fib ([place])   . Atrial flutter ([place]) 01/14/2017  . Chronic kidney disease   . Diabetes mellitus type II, non insulin dependent ([place]) 01/14/2017  . DVT of lower extremity, bilateral ([place]): peroneal veins 01/15/2017  . Hypertension     . Major depressive disorder with psychotic features ([place])   . Major neurocognitive disorder ([place])   . Pulmonary embolus ([place]) 01/13/2017    MEDICATIONS: Current Outpatient Medications on File Prior to Visit  Medication Sig Dispense Refill  . atorvastatin (LIPITOR) 20 MG tablet Take 1 tablet (20 mg total) by mouth daily at 6 PM. 30 tablet 0  . Cholecalciferol (VITAMIN D) 125 MCG (5000 UT) CAPS Take 1 tablet by mouth once a week.    Marland Kitchen JANUVIA 100 MG tablet     . magnesium 30 MG tablet Take 250 mg by mouth daily.    . memantine (NAMENDA) 10 MG tablet Take 1 tablet every night for 2 weeks, then increase to 1 tablet twice a day 60 tablet 5  . metoprolol succinate (TOPROL XL) 25 MG 24 hr tablet TAKE 3 TABLETS DAILY. TAKE WITH OR IMMEDIATELY FOLLOWING A MEAL (Patient taking differently: TAKE 2 TABLETS DAILY. TAKE WITH OR IMMEDIATELY FOLLOWING A MEAL) 270 tablet 1  . risperiDONE (RISPERDAL) 1 MG tablet Take 0.5 mg by mouth daily.     . rivaroxaban (XARELTO) 20 MG TABS tablet Take 1 tablet (20 mg total) by mouth daily with supper. 90 tablet 3  . vitamin B-12 (CYANOCOBALAMIN) 1000 MCG tablet Take 1,000 mcg by mouth daily.  No current facility-administered medications on file prior to visit.    ALLERGIES: No Known Allergies  FAMILY HISTORY: Family History  Problem Relation Age of Onset  . Deep vein thrombosis Neg Hx     SOCIAL HISTORY: Social History   Socioeconomic History  . Marital status: Married    Spouse name: Not on file  . Number of children: Not on file  . Years of education: Not on file  . Highest education level: Not on file  Occupational History  . Not on file  Tobacco Use  . Smoking status: Never Smoker  . Smokeless tobacco: Never Used  Vaping Use  . Vaping Use: Never used  Substance and Sexual Activity  . Alcohol use: No  . Drug use: No  . Sexual activity: Not on file  Other Topics Concern  . Not on file  Social History Narrative   Right handed    Lives  alone, children live close    Social Determinants of Health   Financial Resource Strain:   . Difficulty of Paying Living Expenses:   Food Insecurity:   . Worried About Charity fundraiser in the Last Year:   . Arboriculturist in the Last Year:   Transportation Needs:   . Film/video editor (Medical):   Marland Kitchen Lack of Transportation (Non-Medical):   Physical Activity:   . Days of Exercise per Week:   . Minutes of Exercise per Session:   Stress:   . Feeling of Stress :   Social Connections:   . Frequency of Communication with Friends and Family:   . Frequency of Social Gatherings with Friends and Family:   . Attends Religious Services:   . Active Member of Clubs or Organizations:   . Attends Archivist Meetings:   Marland Kitchen Marital Status:   Intimate Partner Violence:   . Fear of Current or Ex-Partner:   . Emotionally Abused:   Marland Kitchen Physically Abused:   . Sexually Abused:     PHYSICAL EXAM: Vitals:   07/23/20 1432  BP: (!) 151/78  Pulse: 60  SpO2: 96%   General: No acute distress, flat affect Head:  Normocephalic/atraumatic Skin/Extremities: No rash, no edema Neurological Exam: awake, alert, no aphasia or dysarthria. Fund of knowledge is reduced. Recent and remote memory are impaired. Attention and concentration are reduced. Cranial nerves: Pupils equal, round. Extraocular movements intact. No facial asymmetry. Motor: moves all extremities symmetrically.   IMPRESSION: This is an 86 yo RH woman with a history of  hypertension, hyperlipidemia, diabetes, MDD with psychotic features, mixed dementia, with Neuropsychological testing in October 2020 indicating dementia, likely multifactorial (ie mixed features with vascular and AD). Her daughter wonders about side effects on Memantine, we discussed reducing dose to 10mg  qhs and see if there is improvement. Main concern continues to be behavioral, due to her prior psychiatric history, her daughter continues to wonder if there is more  that can be done, if this is more related to underlying neurodegenerative condition versus psychiatric. I suspect it is a combination of both, we discussed adding on an SSRI in a month, she will call our office after reviewing prior medications tried. Repeat Neuropsych testing will be ordered for October 2021 to further assess trajectory, hopefully this further clarifies concerns. Continue close supervision. Follow-up in 6 months, they know to call for any changes.   Thank you for allowing me to participate in her care.  Please do not hesitate to call for any questions or concerns.  Ellouise Newer, M.D.   CC: Dr. Brigitte Pulse

## 2020-07-30 ENCOUNTER — Telehealth: Payer: Self-pay | Admitting: Neurology

## 2020-07-30 NOTE — Telephone Encounter (Signed)
Patient's daughter called in to let Dr. Delice Lesch know which anti-depressants the patient had taken in the past. She has taken Prozac 10&20mg , Zoloft 50&100mg , and Nortriptyline 10&25mg .

## 2020-08-02 ENCOUNTER — Telehealth: Payer: Self-pay | Admitting: Cardiology

## 2020-08-02 NOTE — Telephone Encounter (Signed)
Spoke to pt Daughter she stated that pt heart rate droped down yesterday to 69 daughter asking if lexapro will make her heart rate drop lower they have a call out to cardio. Asked Dr Delice Lesch about starting Lexapro she stated to hold of until cardiology clears pt. PT daughter verbalized understanding and will call the office back once pt see cardio.

## 2020-08-02 NOTE — Telephone Encounter (Signed)
Patient's daughter returning call. She states to call her work number back.

## 2020-08-02 NOTE — Telephone Encounter (Signed)
LMTCB

## 2020-08-02 NOTE — Telephone Encounter (Signed)
STAT if HR is under 50 or over 120 (normal HR is 60-100 beats per minute)  1) What is your heart rate? 54 this morning  2) Do you have a log of your heart rate readings (document readings)? 34 last night around 4:00 pm, 65 around 8:00 pm  3) Do you have any other symptoms? No   Patient's daughter states last night the patient's HR dropped to 34. She states last night the patient only took half her metoprolol tablet due to her low HR. She states this morning it is now 54. She would like to know if her medications should be adjusted. She also has a long of the patient's BP and HR readings she can fax to the office.

## 2020-08-02 NOTE — Telephone Encounter (Signed)
Pls let daughter know I would like to try Lexapro (escitalopram) low dose 10mg  daily. Side effects can include dizziness or drowsiness. Can take every night. It takes 3-4 weeks for full effect, and if no side effects but not much improvement in symptoms, we can increase dose in a month. If she is agreeable, pls send in Rx, thanks

## 2020-08-02 NOTE — Telephone Encounter (Signed)
Pts daughter called to report that the pts BP and HR have been running low.. she is asymptomatic but she says she is not the best historian.   She is asking if she can decrease the Metoprolol dose some and she will continue to monitor her.   She says she probably is not drinking fluids and has advised her to drink more water during the day.   Her BP has been 94/62, 91/60, 101/50,102/57.  HR 50-60.   She is currently taking metoprolol 25 mg po bid.   Will forward to dr. Stanford Breed for review.

## 2020-08-03 MED ORDER — METOPROLOL SUCCINATE ER 25 MG PO TB24: 12.5000 mg | ORAL_TABLET | Freq: Every day | ORAL | 3 refills | Status: DC

## 2020-08-03 NOTE — Telephone Encounter (Signed)
Spoke with pt daughter, Aware of dr Jacalyn Lefevre recommendations. New script sent to the pharmacy.

## 2020-08-03 NOTE — Telephone Encounter (Signed)
Discontinue metoprolol.  Treat with Toprol 12.5 mg daily.  Follow heart rate and blood pressure. Kirk Ruths

## 2020-08-06 ENCOUNTER — Telehealth: Payer: Self-pay | Admitting: Cardiology

## 2020-08-06 NOTE — Telephone Encounter (Signed)
Pt's daughter aware to take 1/2 tab of Metoprolol succ 25 mg and to monitor B/P and HR Will call back if numbers creep back up .Adonis Housekeeper

## 2020-08-06 NOTE — Telephone Encounter (Signed)
Pt c/o medication issue:  1. Name of Medication: metoprolol succinate (TOPROL XL) 25 MG 24 hr tablet  2. How are you currently taking this medication (dosage and times per day)?   3. Are you having a reaction (difficulty breathing--STAT)?   4. What is your medication issue? [name] is calling stating when they tried to pick up this medication yesterday the pharmacy advised it would not be covered by insurance due to it being the same medication she previously had filled and being to soon. Please advise.

## 2020-09-01 DIAGNOSIS — F322 Major depressive disorder, single episode, severe without psychotic features: Secondary | ICD-10-CM | POA: Diagnosis not present

## 2020-09-01 DIAGNOSIS — E782 Mixed hyperlipidemia: Secondary | ICD-10-CM | POA: Diagnosis not present

## 2020-09-01 DIAGNOSIS — E1122 Type 2 diabetes mellitus with diabetic chronic kidney disease: Secondary | ICD-10-CM | POA: Diagnosis not present

## 2020-09-01 DIAGNOSIS — F0390 Unspecified dementia without behavioral disturbance: Secondary | ICD-10-CM | POA: Diagnosis not present

## 2020-09-01 DIAGNOSIS — D6869 Other thrombophilia: Secondary | ICD-10-CM | POA: Diagnosis not present

## 2020-09-01 DIAGNOSIS — I519 Heart disease, unspecified: Secondary | ICD-10-CM | POA: Diagnosis not present

## 2020-09-01 DIAGNOSIS — Z0000 Encounter for general adult medical examination without abnormal findings: Secondary | ICD-10-CM | POA: Diagnosis not present

## 2020-09-01 DIAGNOSIS — I2782 Chronic pulmonary embolism: Secondary | ICD-10-CM | POA: Diagnosis not present

## 2020-09-01 DIAGNOSIS — N183 Chronic kidney disease, stage 3 unspecified: Secondary | ICD-10-CM | POA: Diagnosis not present

## 2020-09-01 DIAGNOSIS — I129 Hypertensive chronic kidney disease with stage 1 through stage 4 chronic kidney disease, or unspecified chronic kidney disease: Secondary | ICD-10-CM | POA: Diagnosis not present

## 2020-09-01 DIAGNOSIS — E46 Unspecified protein-calorie malnutrition: Secondary | ICD-10-CM | POA: Diagnosis not present

## 2020-09-01 DIAGNOSIS — I4892 Unspecified atrial flutter: Secondary | ICD-10-CM | POA: Diagnosis not present

## 2020-09-01 DIAGNOSIS — R8290 Unspecified abnormal findings in urine: Secondary | ICD-10-CM | POA: Diagnosis not present

## 2020-09-06 ENCOUNTER — Other Ambulatory Visit: Payer: Medicare Other

## 2020-10-01 ENCOUNTER — Ambulatory Visit: Payer: Medicare Other | Admitting: Podiatry

## 2020-10-11 ENCOUNTER — Encounter: Payer: Self-pay | Admitting: Psychology

## 2020-10-19 ENCOUNTER — Encounter: Payer: Medicare Other | Admitting: Psychology

## 2020-10-23 DIAGNOSIS — Z23 Encounter for immunization: Secondary | ICD-10-CM | POA: Diagnosis not present

## 2020-10-26 ENCOUNTER — Encounter: Payer: Medicare Other | Admitting: Psychology

## 2020-11-16 ENCOUNTER — Other Ambulatory Visit: Payer: Self-pay

## 2020-11-16 ENCOUNTER — Ambulatory Visit (INDEPENDENT_AMBULATORY_CARE_PROVIDER_SITE_OTHER): Payer: Medicare Other | Admitting: Podiatry

## 2020-11-16 DIAGNOSIS — L84 Corns and callosities: Secondary | ICD-10-CM | POA: Diagnosis not present

## 2020-11-16 DIAGNOSIS — B351 Tinea unguium: Secondary | ICD-10-CM | POA: Diagnosis not present

## 2020-11-16 DIAGNOSIS — M2041 Other hammer toe(s) (acquired), right foot: Secondary | ICD-10-CM

## 2020-11-16 DIAGNOSIS — D689 Coagulation defect, unspecified: Secondary | ICD-10-CM

## 2020-11-16 DIAGNOSIS — M2042 Other hammer toe(s) (acquired), left foot: Secondary | ICD-10-CM | POA: Diagnosis not present

## 2020-11-16 NOTE — Progress Notes (Signed)
  Subjective:  Patient ID: Catherine Gonzales, female    DOB: 1934/07/25,  MRN: [id number]  Chief Complaint  Patient presents with  . Nail Problem    Nail trim 1-5 bilateral    86 y.o. female presents with the above complaint. History confirmed with patient. On Xarelto  Objective:  Physical Exam: warm, good capillary refill, nail exam onychomycosis of the toenails, no trophic changes or ulcerative lesions. DP pulses palpable, PT pulses palpable and protective sensation intact Left Foot: normal exam, no swelling, tenderness, instability; ligaments intact, full range of motion of all ankle/foot joints Right Foot: normal exam, no swelling, tenderness, instability; ligaments intact, full range of motion of all ankle/foot joints lesser digital contractures with 2nd toe semi-rigid deformity, PIPJ callus  No images are attached to the encounter.  Assessment:   1. Hammertoes of both feet   2. Onychomycosis   3. Coagulation defect ([place])   4. Callus    Plan:  Patient was evaluated and treated and all questions answered.  Onychomycosis -Nails palliatively debrided secondary to pain. On Xarelto  Procedure: Nail Debridement Type of Debridement: manual, sharp debridement. Instrumentation: Nail nipper, rotary burr. Number of Nails: 10  Procedure: Paring of Lesion Rationale: painful hyperkeratotic lesion Type of Debridement: manual, sharp debridement. Instrumentation: 312 blade Number of Lesions: 1   Hammertoe  -may need flexor tenotomy at a later date. For now, just proceed with corn cushion.  No follow-ups on file.

## 2020-11-17 ENCOUNTER — Other Ambulatory Visit: Payer: Self-pay | Admitting: Cardiology

## 2020-11-17 NOTE — Telephone Encounter (Signed)
Prescription refill request for Xarelto received.  Indication: Atrial Flutter Last office visit: 11/2019  [name] Weight:77.3 kg Age: 86 Scr: 0.88 CrCl: 58.07 ml/min  Prescription refilled

## 2020-11-22 ENCOUNTER — Ambulatory Visit: Payer: Medicare Other | Admitting: Podiatry

## 2020-11-26 ENCOUNTER — Encounter: Payer: Medicare Other | Admitting: Psychology

## 2020-11-29 ENCOUNTER — Encounter: Payer: Medicare Other | Admitting: Psychology

## 2020-11-29 ENCOUNTER — Telehealth: Payer: Self-pay | Admitting: Cardiology

## 2020-11-29 NOTE — Telephone Encounter (Signed)
Patients son called and wanted to know if Dr. Stanford Breed or nurse could work Catherine Gonzales in between 12/10/20 and 12/17/20. Please call back

## 2020-11-29 NOTE — Telephone Encounter (Signed)
Spoke with the patient's son per the dpr. He has been advised that there were not any appointments available the week of the 12/27 with Dr. Stanford Breed. He was offered an appointment with an APP buy was not available. He stated that the patient was fine. She just needed a follow up.   He stated that he prefers an appointment on a Tuesday. Message will be sent to scheduling to assist.

## 2020-11-30 ENCOUNTER — Other Ambulatory Visit: Payer: Self-pay | Admitting: Neurology

## 2020-12-01 ENCOUNTER — Encounter: Payer: Self-pay | Admitting: Podiatry

## 2020-12-01 ENCOUNTER — Encounter: Payer: Medicare Other | Admitting: Psychology

## 2021-02-18 ENCOUNTER — Ambulatory Visit: Payer: Medicare Other | Admitting: Podiatry

## 2021-03-01 ENCOUNTER — Other Ambulatory Visit: Payer: Self-pay

## 2021-03-01 ENCOUNTER — Ambulatory Visit (INDEPENDENT_AMBULATORY_CARE_PROVIDER_SITE_OTHER): Payer: Medicare Other | Admitting: Podiatry

## 2021-03-01 DIAGNOSIS — Z7984 Long term (current) use of oral hypoglycemic drugs: Secondary | ICD-10-CM | POA: Diagnosis not present

## 2021-03-01 DIAGNOSIS — F0390 Unspecified dementia without behavioral disturbance: Secondary | ICD-10-CM | POA: Diagnosis not present

## 2021-03-01 DIAGNOSIS — F322 Major depressive disorder, single episode, severe without psychotic features: Secondary | ICD-10-CM | POA: Diagnosis not present

## 2021-03-01 DIAGNOSIS — D689 Coagulation defect, unspecified: Secondary | ICD-10-CM | POA: Diagnosis not present

## 2021-03-01 DIAGNOSIS — R8290 Unspecified abnormal findings in urine: Secondary | ICD-10-CM | POA: Diagnosis not present

## 2021-03-01 DIAGNOSIS — B351 Tinea unguium: Secondary | ICD-10-CM

## 2021-03-01 DIAGNOSIS — I129 Hypertensive chronic kidney disease with stage 1 through stage 4 chronic kidney disease, or unspecified chronic kidney disease: Secondary | ICD-10-CM | POA: Diagnosis not present

## 2021-03-01 DIAGNOSIS — N183 Chronic kidney disease, stage 3 unspecified: Secondary | ICD-10-CM | POA: Diagnosis not present

## 2021-03-01 DIAGNOSIS — E1122 Type 2 diabetes mellitus with diabetic chronic kidney disease: Secondary | ICD-10-CM | POA: Diagnosis not present

## 2021-03-01 DIAGNOSIS — I700 Atherosclerosis of aorta: Secondary | ICD-10-CM | POA: Diagnosis not present

## 2021-03-01 NOTE — Progress Notes (Signed)
  Subjective:  Patient ID: Catherine Gonzales, female    DOB: 1934-06-21,  MRN: [id number]  Chief Complaint  Patient presents with  . Nail Problem    RFC Nail trim bilateral 1-5     87 y.o. female presents with the above complaint. History confirmed with patient. On Xarelto  Objective:  Physical Exam: warm, good capillary refill, nail exam onychomycosis of the toenails, no trophic changes or ulcerative lesions. DP pulses palpable, PT pulses palpable and protective sensation intact Left Foot: normal exam, no swelling, tenderness, instability; ligaments intact, full range of motion of all ankle/foot joints Right Foot: normal exam, no swelling, tenderness, instability; ligaments intact, full range of motion of all ankle/foot joints lesser digital contractures with 2nd toe semi-rigid deformity, PIPJ callus  No images are attached to the encounter.  Assessment:   1. Onychomycosis   2. Coagulation defect [facility])    Plan:  Patient was evaluated and treated and all questions answered.  Onychomycosis -Nails palliatively debrided secondary to pain. On Xarelto  Procedure: Nail Debridement Type of Debridement: manual, sharp debridement. Instrumentation: Nail nipper, rotary burr. Number of Nails: 10  No follow-ups on file.

## 2021-03-04 ENCOUNTER — Ambulatory Visit: Payer: Medicare Other | Admitting: Neurology

## 2021-03-22 ENCOUNTER — Ambulatory Visit: Payer: Medicare Other | Admitting: Cardiology

## 2021-03-30 NOTE — Progress Notes (Signed)
HPI: Follow-upatrial fibrillation/atrial flutter. Patient admitted January 2018 with acute pulmonary embolus and also noted to have atrial flutter. Lower extremity Dopplers also showed DVT.Amiodarone discontinued May 2018. Last echocardiogram February 2019 showed normal LV function, grade 1 diastolic dysfunction and mild left atrial enlargement. Patient underwent cardioversion of atrial flutter in the emergency room recently. Seen in follow-up in atrial fibrillation clinic and conservative management felt indicated. Since last seen,  she denies dyspnea, chest pain, palpitations or syncope.  No bleeding.  Current Outpatient Medications  Medication Sig Dispense Refill  . atorvastatin (LIPITOR) 20 MG tablet Take 1 tablet (20 mg total) by mouth daily at 6 PM. 30 tablet 0  . Cholecalciferol (VITAMIN D) 125 MCG (5000 UT) CAPS Take 1 tablet by mouth once a week.    . magnesium 30 MG tablet Take 250 mg by mouth daily.    . memantine (NAMENDA) 10 MG tablet TAKE 1 TABLET BY MOUTH TWICE DAILY 60 tablet 2  . metoprolol succinate (TOPROL XL) 25 MG 24 hr tablet Take 0.5 tablets (12.5 mg total) by mouth daily. 90 tablet 3  . risperiDONE (RISPERDAL) 0.5 MG tablet     . vitamin B-12 (CYANOCOBALAMIN) 1000 MCG tablet Take 1,000 mcg by mouth daily.    Alveda Reasons 20 MG TABS tablet TAKE 1 TABLET DAILY WITH SUPPER 90 tablet 1   No current facility-administered medications for this visit.     Past Medical History:  Diagnosis Date  . A-fib ([place])   . Atrial flutter ([place]) 01/14/2017  . Chronic kidney disease   . Diabetes mellitus type II, non insulin dependent ([place]) 01/14/2017  . DVT of lower extremity, bilateral ([place]): peroneal veins 01/15/2017  . Hypertension   . Major depressive disorder with psychotic features ([place])   . Major neurocognitive disorder ([place])   . Pulmonary embolus ([place]) 01/13/2017    Past Surgical History:  Procedure Laterality Date  . CHOLECYSTECTOMY      Social History    Socioeconomic History  . Marital status: Married    Spouse name: Not on file  . Number of children: Not on file  . Years of education: Not on file  . Highest education level: Not on file  Occupational History  . Not on file  Tobacco Use  . Smoking status: Never Smoker  . Smokeless tobacco: Never Used  Vaping Use  . Vaping Use: Never used  Substance and Sexual Activity  . Alcohol use: No  . Drug use: No  . Sexual activity: Not on file  Other Topics Concern  . Not on file  Social History Narrative   Right handed    Lives alone, children live close    Social Determinants of Health   Financial Resource Strain: Not on file  Food Insecurity: Not on file  Transportation Needs: Not on file  Physical Activity: Not on file  Stress: Not on file  Social Connections: Not on file  Intimate Partner Violence: Not on file    Family History  Problem Relation Age of Onset  . Deep vein thrombosis Neg Hx     ROS: no fevers or chills, productive cough, hemoptysis, dysphasia, odynophagia, melena, hematochezia, dysuria, hematuria, rash, seizure activity, orthopnea, PND, pedal edema, claudication. Remaining systems are negative.  Physical Exam: Well-developed well-nourished in no acute distress.  Skin is warm and dry.  HEENT is normal.  Neck is supple.  Chest is clear to auscultation with normal expansion.  Cardiovascular exam is regular rate and rhythm.  Abdominal exam nontender or distended. No masses palpated. Extremities show no edema. neuro grossly intact  ECG-sinus bradycardia at a rate of 52, nonspecific ST changes, low voltage.  Personally reviewed  A/P  1 paroxysmal atrial fibrillation-plan to continue Toprol and Xarelto.  I will have most recent CBC and bmet forwarded from primary care.  We can consider resumption of amiodarone in the future if she has more frequent episodes so she does not appear to be symptomatic at present.  2 hypertension-blood pressure is  controlled.  Continue present medications.  3 previous pulmonary embolus-continue Xarelto.  4 chronic diastolic congestive heart failure-she remains euvolemic.  Continue fluid restriction and low-sodium diet.  Kirk Ruths, MD

## 2021-04-05 ENCOUNTER — Other Ambulatory Visit: Payer: Self-pay

## 2021-04-05 ENCOUNTER — Ambulatory Visit (INDEPENDENT_AMBULATORY_CARE_PROVIDER_SITE_OTHER): Payer: Medicare Other | Admitting: Cardiology

## 2021-04-05 ENCOUNTER — Encounter: Payer: Self-pay | Admitting: Cardiology

## 2021-04-05 VITALS — BP 128/72 | HR 52 | Ht 64.5 in | Wt 173.0 lb

## 2021-04-05 DIAGNOSIS — I5032 Chronic diastolic (congestive) heart failure: Secondary | ICD-10-CM | POA: Diagnosis not present

## 2021-04-05 DIAGNOSIS — I10 Essential (primary) hypertension: Secondary | ICD-10-CM | POA: Diagnosis not present

## 2021-04-05 DIAGNOSIS — I4892 Unspecified atrial flutter: Secondary | ICD-10-CM | POA: Diagnosis not present

## 2021-04-05 NOTE — Patient Instructions (Signed)

## 2021-06-07 ENCOUNTER — Other Ambulatory Visit: Payer: Self-pay

## 2021-06-07 ENCOUNTER — Ambulatory Visit (INDEPENDENT_AMBULATORY_CARE_PROVIDER_SITE_OTHER): Payer: Medicare Other | Admitting: Podiatry

## 2021-06-07 DIAGNOSIS — B351 Tinea unguium: Secondary | ICD-10-CM | POA: Diagnosis not present

## 2021-06-07 DIAGNOSIS — D689 Coagulation defect, unspecified: Secondary | ICD-10-CM | POA: Diagnosis not present

## 2021-06-13 ENCOUNTER — Other Ambulatory Visit: Payer: Self-pay | Admitting: Cardiology

## 2021-06-17 NOTE — Progress Notes (Signed)
  Subjective:  Patient ID: Catherine Gonzales, female    DOB: 01/14/1934,  MRN: [id number]  Chief Complaint  Patient presents with   Nail Problem    Thick/painful nails    87 y.o. female presents with the above complaint. History confirmed with patient. On Xarelto  Objective:  Physical Exam: warm, good capillary refill, nail exam onychomycosis of the toenails, no trophic changes or ulcerative lesions. DP pulses palpable, PT pulses palpable and protective sensation intact Left Foot: normal exam, no swelling, tenderness, instability; ligaments intact, full range of motion of all ankle/foot joints Right Foot: normal exam, no swelling, tenderness, instability; ligaments intact, full range of motion of all ankle/foot joints lesser digital contractures with 2nd toe semi-rigid deformity, PIPJ callus  No images are attached to the encounter.  Assessment:   1. Onychomycosis   2. Coagulation defect [facility])     Plan:  Patient was evaluated and treated and all questions answered.  Onychomycosis -Nails palliatively debrided secondary to pain. On Xarelto  Procedure: Nail Debridement Type of Debridement: manual, sharp debridement. Instrumentation: Nail nipper, rotary burr. Number of Nails: 10  No follow-ups on file.

## 2021-09-02 DIAGNOSIS — E782 Mixed hyperlipidemia: Secondary | ICD-10-CM | POA: Diagnosis not present

## 2021-09-02 DIAGNOSIS — I2782 Chronic pulmonary embolism: Secondary | ICD-10-CM | POA: Diagnosis not present

## 2021-09-02 DIAGNOSIS — D6869 Other thrombophilia: Secondary | ICD-10-CM | POA: Diagnosis not present

## 2021-09-02 DIAGNOSIS — Z0000 Encounter for general adult medical examination without abnormal findings: Secondary | ICD-10-CM | POA: Diagnosis not present

## 2021-09-02 DIAGNOSIS — I129 Hypertensive chronic kidney disease with stage 1 through stage 4 chronic kidney disease, or unspecified chronic kidney disease: Secondary | ICD-10-CM | POA: Diagnosis not present

## 2021-09-02 DIAGNOSIS — E46 Unspecified protein-calorie malnutrition: Secondary | ICD-10-CM | POA: Diagnosis not present

## 2021-09-02 DIAGNOSIS — N183 Chronic kidney disease, stage 3 unspecified: Secondary | ICD-10-CM | POA: Diagnosis not present

## 2021-09-02 DIAGNOSIS — F322 Major depressive disorder, single episode, severe without psychotic features: Secondary | ICD-10-CM | POA: Diagnosis not present

## 2021-09-02 DIAGNOSIS — F0390 Unspecified dementia without behavioral disturbance: Secondary | ICD-10-CM | POA: Diagnosis not present

## 2021-09-02 DIAGNOSIS — E1122 Type 2 diabetes mellitus with diabetic chronic kidney disease: Secondary | ICD-10-CM | POA: Diagnosis not present

## 2021-09-02 DIAGNOSIS — Z23 Encounter for immunization: Secondary | ICD-10-CM | POA: Diagnosis not present

## 2021-09-02 DIAGNOSIS — I519 Heart disease, unspecified: Secondary | ICD-10-CM | POA: Diagnosis not present

## 2021-09-02 DIAGNOSIS — E538 Deficiency of other specified B group vitamins: Secondary | ICD-10-CM | POA: Diagnosis not present

## 2021-09-06 ENCOUNTER — Ambulatory Visit: Payer: Medicare Other | Admitting: Podiatry

## 2021-09-11 DIAGNOSIS — R634 Abnormal weight loss: Secondary | ICD-10-CM | POA: Diagnosis not present

## 2021-09-11 DIAGNOSIS — F0391 Unspecified dementia with behavioral disturbance: Secondary | ICD-10-CM | POA: Diagnosis not present

## 2021-09-11 DIAGNOSIS — E1122 Type 2 diabetes mellitus with diabetic chronic kidney disease: Secondary | ICD-10-CM | POA: Diagnosis not present

## 2021-09-11 DIAGNOSIS — I129 Hypertensive chronic kidney disease with stage 1 through stage 4 chronic kidney disease, or unspecified chronic kidney disease: Secondary | ICD-10-CM | POA: Diagnosis not present

## 2021-09-11 DIAGNOSIS — I4891 Unspecified atrial fibrillation: Secondary | ICD-10-CM | POA: Diagnosis not present

## 2021-09-11 DIAGNOSIS — Z6825 Body mass index (BMI) 25.0-25.9, adult: Secondary | ICD-10-CM | POA: Diagnosis not present

## 2021-09-11 DIAGNOSIS — F32A Depression, unspecified: Secondary | ICD-10-CM | POA: Diagnosis not present

## 2021-09-11 DIAGNOSIS — N183 Chronic kidney disease, stage 3 unspecified: Secondary | ICD-10-CM | POA: Diagnosis not present

## 2021-09-11 DIAGNOSIS — Z8639 Personal history of other endocrine, nutritional and metabolic disease: Secondary | ICD-10-CM | POA: Diagnosis not present

## 2021-09-16 DIAGNOSIS — Z6825 Body mass index (BMI) 25.0-25.9, adult: Secondary | ICD-10-CM | POA: Diagnosis not present

## 2021-09-16 DIAGNOSIS — R634 Abnormal weight loss: Secondary | ICD-10-CM | POA: Diagnosis not present

## 2021-09-16 DIAGNOSIS — E1122 Type 2 diabetes mellitus with diabetic chronic kidney disease: Secondary | ICD-10-CM | POA: Diagnosis not present

## 2021-09-16 DIAGNOSIS — F0391 Unspecified dementia with behavioral disturbance: Secondary | ICD-10-CM | POA: Diagnosis not present

## 2021-09-16 DIAGNOSIS — I129 Hypertensive chronic kidney disease with stage 1 through stage 4 chronic kidney disease, or unspecified chronic kidney disease: Secondary | ICD-10-CM | POA: Diagnosis not present

## 2021-09-16 DIAGNOSIS — I4891 Unspecified atrial fibrillation: Secondary | ICD-10-CM | POA: Diagnosis not present

## 2021-10-11 ENCOUNTER — Other Ambulatory Visit: Payer: Self-pay

## 2021-10-11 ENCOUNTER — Ambulatory Visit (INDEPENDENT_AMBULATORY_CARE_PROVIDER_SITE_OTHER): Payer: Medicare Other | Admitting: Podiatry

## 2021-10-11 DIAGNOSIS — B351 Tinea unguium: Secondary | ICD-10-CM | POA: Diagnosis not present

## 2021-10-11 DIAGNOSIS — D689 Coagulation defect, unspecified: Secondary | ICD-10-CM

## 2021-10-11 NOTE — Progress Notes (Signed)
  Subjective:  Patient ID: Catherine Gonzales, female    DOB: 17-Nov-1934,  MRN: [id number]  Chief Complaint  Patient presents with   Nail Problem     Diabetic foot care    87 y.o. female presents with the above complaint. History confirmed with patient. On Xarelto  Objective:  Physical Exam: warm, good capillary refill, nail exam onychomycosis of the toenails, no trophic changes or ulcerative lesions. DP pulses palpable, PT pulses palpable and protective sensation intact Left Foot: normal exam, no swelling, tenderness, instability; ligaments intact, full range of motion of all ankle/foot joints Right Foot: normal exam, no swelling, tenderness, instability; ligaments intact, full range of motion of all ankle/foot joints lesser digital contractures with 2nd toe semi-rigid deformity, PIPJ callus  No images are attached to the encounter.  Assessment:   1. Onychomycosis   2. Coagulation defect [facility])     Plan:  Patient was evaluated and treated and all questions answered.  Onychomycosis -Nails palliatively debrided secondary to pain. On Xarelto  Procedure: Nail Debridement Type of Debridement: manual, sharp debridement. Instrumentation: Nail nipper, rotary burr. Number of Nails: 10  Return in about 3 months (around 01/11/2022) for Routine Foot Care - on Anticoagulant.

## 2022-01-11 DIAGNOSIS — E538 Deficiency of other specified B group vitamins: Secondary | ICD-10-CM | POA: Diagnosis not present

## 2022-01-11 DIAGNOSIS — I700 Atherosclerosis of aorta: Secondary | ICD-10-CM | POA: Diagnosis not present

## 2022-01-11 DIAGNOSIS — E1122 Type 2 diabetes mellitus with diabetic chronic kidney disease: Secondary | ICD-10-CM | POA: Diagnosis not present

## 2022-01-11 DIAGNOSIS — N183 Chronic kidney disease, stage 3 unspecified: Secondary | ICD-10-CM | POA: Diagnosis not present

## 2022-01-11 DIAGNOSIS — E559 Vitamin D deficiency, unspecified: Secondary | ICD-10-CM | POA: Diagnosis not present

## 2022-01-11 DIAGNOSIS — F0390 Unspecified dementia without behavioral disturbance: Secondary | ICD-10-CM | POA: Diagnosis not present

## 2022-01-11 DIAGNOSIS — I129 Hypertensive chronic kidney disease with stage 1 through stage 4 chronic kidney disease, or unspecified chronic kidney disease: Secondary | ICD-10-CM | POA: Diagnosis not present

## 2022-01-11 DIAGNOSIS — D6869 Other thrombophilia: Secondary | ICD-10-CM | POA: Diagnosis not present

## 2022-01-11 DIAGNOSIS — D692 Other nonthrombocytopenic purpura: Secondary | ICD-10-CM | POA: Diagnosis not present

## 2022-01-11 DIAGNOSIS — I2782 Chronic pulmonary embolism: Secondary | ICD-10-CM | POA: Diagnosis not present

## 2022-01-17 ENCOUNTER — Ambulatory Visit: Payer: Medicare Other | Admitting: Podiatry

## 2022-01-31 ENCOUNTER — Other Ambulatory Visit: Payer: Self-pay

## 2022-01-31 ENCOUNTER — Ambulatory Visit (INDEPENDENT_AMBULATORY_CARE_PROVIDER_SITE_OTHER): Payer: Medicare Other | Admitting: Podiatry

## 2022-01-31 DIAGNOSIS — D689 Coagulation defect, unspecified: Secondary | ICD-10-CM | POA: Diagnosis not present

## 2022-01-31 DIAGNOSIS — B351 Tinea unguium: Secondary | ICD-10-CM | POA: Diagnosis not present

## 2022-01-31 NOTE — Progress Notes (Signed)
°  Subjective:  Patient ID: Catherine Gonzales, female    DOB: 1934-11-09,  MRN: [id number]  No chief complaint on file.  88 y.o. female presents with the above complaint. History confirmed with patient. On Xarelto. Denies new pedal issues.  Objective:  Physical Exam: warm, good capillary refill, nail exam onychomycosis of the toenails, no trophic changes or ulcerative lesions. DP pulses palpable, PT pulses palpable and protective sensation intact Left Foot: normal exam, no swelling, tenderness, instability; ligaments intact, full range of motion of all ankle/foot joints Right Foot: normal exam, no swelling, tenderness, instability; ligaments intact, full range of motion of all ankle/foot joints lesser digital contractures with 2nd toe semi-rigid deformity, PIPJ callus  No images are attached to the encounter.  Assessment:   1. Onychomycosis   2. Coagulation defect [facility])     Plan:  Patient was evaluated and treated and all questions answered.  Onychomycosis -Nails palliatively debrided secondary to pain. On Xarelto  Procedure: Nail Debridement Type of Debridement: manual, sharp debridement. Instrumentation: Nail nipper, rotary burr. Number of Nails: 10 Disposition: Patient tolerated well without iatrogenic injury.   No follow-ups on file.

## 2022-05-02 ENCOUNTER — Ambulatory Visit: Payer: Medicare Other | Admitting: Podiatry

## 2022-05-16 ENCOUNTER — Ambulatory Visit: Payer: Medicare Other | Admitting: Podiatry

## 2022-05-18 ENCOUNTER — Ambulatory Visit (INDEPENDENT_AMBULATORY_CARE_PROVIDER_SITE_OTHER): Payer: Medicare Other | Admitting: Podiatry

## 2022-05-18 DIAGNOSIS — M79674 Pain in right toe(s): Secondary | ICD-10-CM

## 2022-05-18 DIAGNOSIS — B351 Tinea unguium: Secondary | ICD-10-CM | POA: Diagnosis not present

## 2022-05-18 DIAGNOSIS — Z7901 Long term (current) use of anticoagulants: Secondary | ICD-10-CM | POA: Diagnosis not present

## 2022-05-18 DIAGNOSIS — M79675 Pain in left toe(s): Secondary | ICD-10-CM

## 2022-05-22 ENCOUNTER — Encounter: Payer: Self-pay | Admitting: Podiatry

## 2022-05-22 ENCOUNTER — Other Ambulatory Visit: Payer: Self-pay | Admitting: Cardiology

## 2022-05-22 NOTE — Progress Notes (Signed)
  Subjective:  Patient ID: Catherine Gonzales, female    DOB: 01/04/1934,  MRN: [id number]  Chief Complaint  Patient presents with   routine foot care    Patient is here for routine foot care.    88 y.o. female presents with the above complaint. History confirmed with patient.  She is here today with her daughter.  She is a former patient of Dr. March Rummage, saw her routinely for painful thickened elongated nails and they are bothersome again.  She is on Xarelto.  Objective:  Physical Exam: warm, good capillary refill, no trophic changes or ulcerative lesions, normal DP and PT pulses, and normal sensory exam. Left Foot: dystrophic yellowed discolored nail plates with subungual debris Right Foot: dystrophic yellowed discolored nail plates with subungual debris  Assessment:   1. Pain due to onychomycosis of toenails of both feet   2. Chronic anticoagulation      Plan:  Patient was evaluated and treated and all questions answered.  Discussed the etiology and treatment options for the condition in detail with the patient. Educated patient on the topical and oral treatment options for mycotic nails. Recommended debridement of the nails today. Sharp and mechanical debridement performed of all painful and mycotic nails today. Nails debrided in length and thickness using a nail nipper to level of comfort. Discussed treatment options including appropriate shoe gear. Follow up as needed for painful nails.    Return in about 3 months (around 08/18/2022) for at risk diabetic foot care.

## 2022-05-22 NOTE — Telephone Encounter (Signed)
Prescription refill request for Xarelto received.  Indication:Aflutter Last office visit:needs appt Weight:78.5 kg Age:88 Scr:0.9 CrCl:55.6 ml/min  Prescription refilled

## 2022-07-22 ENCOUNTER — Other Ambulatory Visit: Payer: Self-pay | Admitting: Cardiology

## 2022-08-15 ENCOUNTER — Encounter: Payer: Self-pay | Admitting: Nurse Practitioner

## 2022-08-15 ENCOUNTER — Ambulatory Visit: Payer: Medicare Other | Attending: Nurse Practitioner | Admitting: Nurse Practitioner

## 2022-08-15 VITALS — BP 126/78 | HR 48 | Ht 64.5 in | Wt 130.6 lb

## 2022-08-15 DIAGNOSIS — Z86711 Personal history of pulmonary embolism: Secondary | ICD-10-CM | POA: Diagnosis not present

## 2022-08-15 DIAGNOSIS — Z86718 Personal history of other venous thrombosis and embolism: Secondary | ICD-10-CM | POA: Insufficient documentation

## 2022-08-15 DIAGNOSIS — R001 Bradycardia, unspecified: Secondary | ICD-10-CM | POA: Insufficient documentation

## 2022-08-15 DIAGNOSIS — I5032 Chronic diastolic (congestive) heart failure: Secondary | ICD-10-CM | POA: Insufficient documentation

## 2022-08-15 DIAGNOSIS — I10 Essential (primary) hypertension: Secondary | ICD-10-CM | POA: Insufficient documentation

## 2022-08-15 DIAGNOSIS — I480 Paroxysmal atrial fibrillation: Secondary | ICD-10-CM | POA: Diagnosis not present

## 2022-08-15 MED ORDER — RIVAROXABAN 20 MG PO TABS: 20.0000 mg | ORAL_TABLET | Freq: Every day | ORAL | 3 refills | Status: AC

## 2022-08-15 MED ORDER — METOPROLOL SUCCINATE ER 25 MG PO TB24: ORAL_TABLET | ORAL | 3 refills | Status: AC

## 2022-08-15 NOTE — Progress Notes (Signed)
Office Visit    Patient Name: Catherine Gonzales Date of Encounter: 08/15/2022  Primary Care Provider:  Mayra Neer, MD Primary Cardiologist:  Kirk Ruths, MD  Chief Complaint    88 year old female with a history of paroxysmal atrial fibrillation, chronic diastolic heart failure, hypertension, CKD, PE/DVT, type 2 diabetes and depression who presents for follow-up related to atrial fibrillation, heart failure, and hypertension.   Past Medical History    Past Medical History:  Diagnosis Date   A-fib [facility])    Atrial flutter ([place]) 01/14/2017   Chronic kidney disease    Diabetes mellitus type II, non insulin dependent ([place]) 01/14/2017   DVT of lower extremity, bilateral ([place]): peroneal veins 01/15/2017   Hypertension    Major depressive disorder with psychotic features ([place])    Major neurocognitive disorder (HCC)    Pulmonary embolus ([place]) 01/13/2017   Past Surgical History:  Procedure Laterality Date   CHOLECYSTECTOMY      Allergies  Allergies  Allergen Reactions   Fluoxetine     Other reaction(s): sleepiness    History of Present Illness    88 year old female with the above past medical history including paroxysmal atrial fibrillation, chronic diastolic heart failure, hypertension, CKD, PE/DVT, type 2 diabetes and depression.  She was hospitalized in January 2018 with acute PE and was noted to have atrial flutter.  Lower extremity Dopplers also revealed DVT.  She was previously on amiodarone, however, this was discontinued in May 2018.  Most recent echocardiogram in February 2019 showed normal LV function, G1 DD, moderate LAE. She underwent DCCV in 06/2018.  She has had recurrence of atrial fibrillation since this time.  She has followed in the atrial fibrillation clinic in the past, conservative management was recommended.  She is on chronic anticoagulation with Xarelto.  She was last seen in the office on 04/05/2021 and was stable from a cardiac standpoint.  She was  maintaining sinus rhythm at the time.  Future resumption of amiodarone was considered should she have more frequent episodes of atrial fibrillation in the future.  She presents today for follow-up accompanied by her son.  Since her last visit she has been stable from a cardiac standpoint.  She denies any palpitations, dizziness, presyncope, syncope, edema, dyspnea, PND, orthopnea, weight gain.  Her son states she has had a decrease in appetite and has lost some weight.  Patient also has a history of dementia.  Otherwise, she reports feeling well and denies any additional concerns today.  Home Medications    Current Outpatient Medications  Medication Sig Dispense Refill   atorvastatin (LIPITOR) 20 MG tablet Take 1 tablet (20 mg total) by mouth daily at 6 PM. 30 tablet 0   Cholecalciferol (VITAMIN D) 125 MCG (5000 UT) CAPS Take 1 tablet by mouth once a week.     magnesium 30 MG tablet Take 250 mg by mouth daily.     risperiDONE (RISPERDAL) 0.5 MG tablet      vitamin B-12 (CYANOCOBALAMIN) 1000 MCG tablet Take 1,000 mcg by mouth daily.     vitamin C (ASCORBIC ACID) 500 MG tablet 1 tablet     metoprolol succinate (TOPROL-XL) 25 MG 24 hr tablet TAKE ONE-HALF (1/2) TABLET TWICE A DAY 90 tablet 3   rivaroxaban (XARELTO) 20 MG TABS tablet Take 1 tablet (20 mg total) by mouth daily with supper. 90 tablet 3   No current facility-administered medications for this visit.     Review of Systems    She denies chest pain,  palpitations, dyspnea, pnd, orthopnea, n, v, dizziness, syncope, edema, weight gain, or early satiety. All other systems reviewed and are otherwise negative except as noted above.   Physical Exam    VS:  BP 126/78   Pulse (!) 48   Ht 5' 4.5" (1.638 m)   Wt 130 lb 9.6 oz (59.2 kg)   SpO2 100%   BMI 22.07 kg/m  GEN: Well nourished, well developed, in no acute distress. HEENT: normal. Neck: Supple, no JVD, carotid bruits, or masses. Cardiac: RRR, no murmurs, rubs, or gallops. No  clubbing, cyanosis, edema.  Radials/DP/PT 2+ and equal bilaterally.  Respiratory:  Respirations regular and unlabored, clear to auscultation bilaterally. GI: Soft, nontender, nondistended, BS + x 4. MS: no deformity or atrophy. Skin: warm and dry, no rash. Neuro:  Strength and sensation are intact. Psych: Normal affect.  Accessory Clinical Findings    ECG personally reviewed by me today - Sinus bradycardia, 48 bpm, low voltage - no acute changes.   Lab Results  Component Value Date   WBC 8.2 12/11/2019   HGB 14.3 12/11/2019   HCT 44.1 12/11/2019   MCV 88 12/11/2019   PLT 194 12/11/2019   Lab Results  Component Value Date   CREATININE 0.88 12/11/2019   BUN 21 12/11/2019   NA 141 12/11/2019   K 4.4 12/11/2019   CL 105 12/11/2019   CO2 19 (L) 12/11/2019   Lab Results  Component Value Date   ALT 19 01/19/2017   AST 21 01/19/2017   ALKPHOS 104 01/19/2017   BILITOT 0.9 01/19/2017   No results found for: "CHOL", "HDL", "LDLCALC", "LDLDIRECT", "TRIG", "CHOLHDL"  Lab Results  Component Value Date   HGBA1C 8.3 (H) 01/14/2017    Assessment & Plan    1. Paroxysmal atrial fibrillation: Maintaining sinus rhythm. She denies palpitations, dyspnea, dizziness, presyncope or syncope.  Continue metoprolol, Xarelto.  2. Sinus bradycardia: EKG today shows SB, 48 bpm. HR at last visist was 52 bpm. Asymptomatic. Continue to monitor and if HR consistently < 50 bpm and symptomatic, consider reduction of beta-blocker dose.    3. Chronic diastolic heart failure: Echo in 2019 showed normal LV function, G1 DD, moderate LAE. She underwent DCCV in 06/2018. Euvolemic and well compensated on exam. Continue metoprolol.   4. Hypertension: BP well controlled. Continue current antihypertensive regimen.   5. H/o PE/DVT: Continue Xarelto.   6. Disposition: Follow-up in 1 year.      Lenna Sciara, NP 08/15/2022, 5:22 PM

## 2022-08-15 NOTE — Patient Instructions (Signed)
Medication Instructions:  Your physician recommends that you continue on your current medications as directed. Please refer to the Current Medication list given to you today.  *If you need a refill on your cardiac medications before your next appointment, please call your pharmacy*  Lab Work: NONE ordered at this time of appointment   If you have labs (blood work) drawn today and your tests are completely normal, you will receive your results only by: MyChart Message (if you have MyChart) OR A paper copy in the mail If you have any lab test that is abnormal or we need to change your treatment, we will call you to review the results.  Testing/Procedures: NONE ordered at this time of appointment   Follow-Up: At [facility], you and your health needs are our priority.  As part of our continuing mission to provide you with exceptional heart care, we have created designated Provider Care Teams.  These Care Teams include your primary Cardiologist (physician) and Advanced Practice Providers (APPs -  Physician Assistants and Nurse Practitioners) who all work together to provide you with the care you need, when you need it.  We recommend signing up for the patient portal called "MyChart".  Sign up information is provided on this After Visit Summary.  MyChart is used to connect with patients for Virtual Visits (Telemedicine).  Patients are able to view lab/test results, encounter notes, upcoming appointments, etc.  Non-urgent messages can be sent to your provider as well.   To learn more about what you can do with MyChart, go to [email or web address].    Your next appointment:   1 year(s)  The format for your next appointment:   In Person  Provider:   Brian [name], MD     Other Instructions   Important Information About Sugar       

## 2022-08-29 ENCOUNTER — Ambulatory Visit (INDEPENDENT_AMBULATORY_CARE_PROVIDER_SITE_OTHER): Payer: Medicare Other | Admitting: Podiatry

## 2022-08-29 DIAGNOSIS — B351 Tinea unguium: Secondary | ICD-10-CM | POA: Diagnosis not present

## 2022-08-29 DIAGNOSIS — M79675 Pain in left toe(s): Secondary | ICD-10-CM | POA: Diagnosis not present

## 2022-08-29 DIAGNOSIS — M79674 Pain in right toe(s): Secondary | ICD-10-CM

## 2022-08-31 ENCOUNTER — Encounter: Payer: Self-pay | Admitting: Podiatry

## 2022-08-31 NOTE — Progress Notes (Signed)
  Subjective:  Patient ID: Catherine Gonzales, female    DOB: 1934/04/06,  MRN: [id number]  Chief Complaint  Patient presents with   Foot Problem    DFC    88 y.o. female presents with the above complaint. History confirmed with patient.  She is here today with her daughter.  The nails are thickened and elongated again  Objective:  Physical Exam: warm, good capillary refill, no trophic changes or ulcerative lesions, normal DP and PT pulses, and normal sensory exam. Left Foot: dystrophic yellowed discolored nail plates with subungual debris Right Foot: dystrophic yellowed discolored nail plates with subungual debris  Assessment:   1. Pain due to onychomycosis of toenails of both feet      Plan:  Patient was evaluated and treated and all questions answered.  Discussed the etiology and treatment options for the condition in detail with the patient. Educated patient on the topical and oral treatment options for mycotic nails. Recommended debridement of the nails today. Sharp and mechanical debridement performed of all painful and mycotic nails today. Nails debrided in length and thickness using a nail nipper to level of comfort. Discussed treatment options including appropriate shoe gear. Follow up as needed for painful nails.    No follow-ups on file.

## 2022-09-08 DIAGNOSIS — I129 Hypertensive chronic kidney disease with stage 1 through stage 4 chronic kidney disease, or unspecified chronic kidney disease: Secondary | ICD-10-CM | POA: Diagnosis not present

## 2022-09-08 DIAGNOSIS — I519 Heart disease, unspecified: Secondary | ICD-10-CM | POA: Diagnosis not present

## 2022-09-08 DIAGNOSIS — F0390 Unspecified dementia without behavioral disturbance: Secondary | ICD-10-CM | POA: Diagnosis not present

## 2022-09-08 DIAGNOSIS — Z0000 Encounter for general adult medical examination without abnormal findings: Secondary | ICD-10-CM | POA: Diagnosis not present

## 2022-09-08 DIAGNOSIS — E1122 Type 2 diabetes mellitus with diabetic chronic kidney disease: Secondary | ICD-10-CM | POA: Diagnosis not present

## 2022-09-08 DIAGNOSIS — N183 Chronic kidney disease, stage 3 unspecified: Secondary | ICD-10-CM | POA: Diagnosis not present

## 2022-09-08 DIAGNOSIS — I2782 Chronic pulmonary embolism: Secondary | ICD-10-CM | POA: Diagnosis not present

## 2022-09-08 DIAGNOSIS — E46 Unspecified protein-calorie malnutrition: Secondary | ICD-10-CM | POA: Diagnosis not present

## 2022-09-08 DIAGNOSIS — E538 Deficiency of other specified B group vitamins: Secondary | ICD-10-CM | POA: Diagnosis not present

## 2022-09-08 DIAGNOSIS — D6869 Other thrombophilia: Secondary | ICD-10-CM | POA: Diagnosis not present

## 2022-11-28 ENCOUNTER — Ambulatory Visit: Payer: Medicare Other | Admitting: Podiatry

## 2022-11-30 ENCOUNTER — Ambulatory Visit: Payer: Medicare Other | Admitting: Podiatry

## 2023-01-02 ENCOUNTER — Emergency Department (HOSPITAL_COMMUNITY): Payer: Medicare Other

## 2023-01-02 ENCOUNTER — Inpatient Hospital Stay (HOSPITAL_COMMUNITY)
Admission: EM | Admit: 2023-01-02 | Discharge: 2023-01-18 | DRG: 951 | Disposition: E | Payer: Medicare Other | Attending: Family Medicine | Admitting: Family Medicine

## 2023-01-02 ENCOUNTER — Inpatient Hospital Stay (HOSPITAL_COMMUNITY): Payer: Medicare Other

## 2023-01-02 DIAGNOSIS — R6521 Severe sepsis with septic shock: Secondary | ICD-10-CM | POA: Diagnosis present

## 2023-01-02 DIAGNOSIS — Z66 Do not resuscitate: Secondary | ICD-10-CM | POA: Diagnosis present

## 2023-01-02 DIAGNOSIS — Z7901 Long term (current) use of anticoagulants: Secondary | ICD-10-CM

## 2023-01-02 DIAGNOSIS — J90 Pleural effusion, not elsewhere classified: Secondary | ICD-10-CM | POA: Diagnosis not present

## 2023-01-02 DIAGNOSIS — I129 Hypertensive chronic kidney disease with stage 1 through stage 4 chronic kidney disease, or unspecified chronic kidney disease: Secondary | ICD-10-CM | POA: Diagnosis present

## 2023-01-02 DIAGNOSIS — R0902 Hypoxemia: Secondary | ICD-10-CM | POA: Diagnosis not present

## 2023-01-02 DIAGNOSIS — R109 Unspecified abdominal pain: Secondary | ICD-10-CM | POA: Diagnosis present

## 2023-01-02 DIAGNOSIS — N184 Chronic kidney disease, stage 4 (severe): Secondary | ICD-10-CM | POA: Diagnosis present

## 2023-01-02 DIAGNOSIS — Z7984 Long term (current) use of oral hypoglycemic drugs: Secondary | ICD-10-CM

## 2023-01-02 DIAGNOSIS — F03C18 Unspecified dementia, severe, with other behavioral disturbance: Secondary | ICD-10-CM | POA: Diagnosis present

## 2023-01-02 DIAGNOSIS — I4891 Unspecified atrial fibrillation: Secondary | ICD-10-CM | POA: Diagnosis present

## 2023-01-02 DIAGNOSIS — N179 Acute kidney failure, unspecified: Secondary | ICD-10-CM | POA: Diagnosis present

## 2023-01-02 DIAGNOSIS — F03C3 Unspecified dementia, severe, with mood disturbance: Secondary | ICD-10-CM | POA: Diagnosis present

## 2023-01-02 DIAGNOSIS — I959 Hypotension, unspecified: Secondary | ICD-10-CM | POA: Diagnosis present

## 2023-01-02 DIAGNOSIS — I9589 Other hypotension: Secondary | ICD-10-CM | POA: Diagnosis not present

## 2023-01-02 DIAGNOSIS — I482 Chronic atrial fibrillation, unspecified: Secondary | ICD-10-CM | POA: Insufficient documentation

## 2023-01-02 DIAGNOSIS — J189 Pneumonia, unspecified organism: Secondary | ICD-10-CM | POA: Diagnosis present

## 2023-01-02 DIAGNOSIS — E119 Type 2 diabetes mellitus without complications: Secondary | ICD-10-CM

## 2023-01-02 DIAGNOSIS — Z86718 Personal history of other venous thrombosis and embolism: Secondary | ICD-10-CM | POA: Diagnosis not present

## 2023-01-02 DIAGNOSIS — Z79899 Other long term (current) drug therapy: Secondary | ICD-10-CM

## 2023-01-02 DIAGNOSIS — Z515 Encounter for palliative care: Secondary | ICD-10-CM | POA: Diagnosis not present

## 2023-01-02 DIAGNOSIS — Z86711 Personal history of pulmonary embolism: Secondary | ICD-10-CM

## 2023-01-02 DIAGNOSIS — F323 Major depressive disorder, single episode, severe with psychotic features: Secondary | ICD-10-CM | POA: Diagnosis present

## 2023-01-02 DIAGNOSIS — E872 Acidosis, unspecified: Secondary | ICD-10-CM | POA: Diagnosis present

## 2023-01-02 DIAGNOSIS — A419 Sepsis, unspecified organism: Principal | ICD-10-CM | POA: Diagnosis present

## 2023-01-02 DIAGNOSIS — R112 Nausea with vomiting, unspecified: Secondary | ICD-10-CM

## 2023-01-02 DIAGNOSIS — I10 Essential (primary) hypertension: Secondary | ICD-10-CM | POA: Diagnosis present

## 2023-01-02 DIAGNOSIS — Z1152 Encounter for screening for COVID-19: Secondary | ICD-10-CM | POA: Diagnosis not present

## 2023-01-02 DIAGNOSIS — R739 Hyperglycemia, unspecified: Secondary | ICD-10-CM | POA: Diagnosis not present

## 2023-01-02 DIAGNOSIS — Z888 Allergy status to other drugs, medicaments and biological substances status: Secondary | ICD-10-CM

## 2023-01-02 DIAGNOSIS — E1122 Type 2 diabetes mellitus with diabetic chronic kidney disease: Secondary | ICD-10-CM | POA: Diagnosis present

## 2023-01-02 DIAGNOSIS — R197 Diarrhea, unspecified: Secondary | ICD-10-CM | POA: Diagnosis present

## 2023-01-02 DIAGNOSIS — Z9049 Acquired absence of other specified parts of digestive tract: Secondary | ICD-10-CM

## 2023-01-02 DIAGNOSIS — R531 Weakness: Secondary | ICD-10-CM

## 2023-01-02 DIAGNOSIS — F03C2 Unspecified dementia, severe, with psychotic disturbance: Secondary | ICD-10-CM | POA: Diagnosis present

## 2023-01-02 DIAGNOSIS — E860 Dehydration: Secondary | ICD-10-CM | POA: Diagnosis present

## 2023-01-02 DIAGNOSIS — F03918 Unspecified dementia, unspecified severity, with other behavioral disturbance: Secondary | ICD-10-CM | POA: Insufficient documentation

## 2023-01-02 DIAGNOSIS — R000 Tachycardia, unspecified: Secondary | ICD-10-CM | POA: Diagnosis not present

## 2023-01-02 LAB — CBC
HCT: 51.8 % — ABNORMAL HIGH (ref 36.0–46.0)
Hemoglobin: 15.9 g/dL — ABNORMAL HIGH (ref 12.0–15.0)
MCH: 30.9 pg (ref 26.0–34.0)
MCHC: 30.7 g/dL (ref 30.0–36.0)
MCV: 100.6 fL — ABNORMAL HIGH (ref 80.0–100.0)
Platelets: 255 10*3/uL (ref 150–400)
RBC: 5.15 MIL/uL — ABNORMAL HIGH (ref 3.87–5.11)
RDW: 14.3 % (ref 11.5–15.5)
WBC: 19.2 10*3/uL — ABNORMAL HIGH (ref 4.0–10.5)
nRBC: 0 % (ref 0.0–0.2)

## 2023-01-02 LAB — BASIC METABOLIC PANEL
Anion gap: 19 — ABNORMAL HIGH (ref 5–15)
BUN: 21 mg/dL (ref 8–23)
CO2: 17 mmol/L — ABNORMAL LOW (ref 22–32)
Calcium: 8 mg/dL — ABNORMAL LOW (ref 8.9–10.3)
Chloride: 109 mmol/L (ref 98–111)
Creatinine, Ser: 1.76 mg/dL — ABNORMAL HIGH (ref 0.44–1.00)
GFR, Estimated: 28 mL/min — ABNORMAL LOW (ref >60)
Glucose, Bld: 93 mg/dL (ref 70–99)
Potassium: 4 mmol/L (ref 3.5–5.1)
Sodium: 145 mmol/L (ref 135–145)

## 2023-01-02 LAB — RESP PANEL BY RT-PCR (RSV, FLU A&B, COVID)  RVPGX2
Influenza A by PCR: NEGATIVE
Influenza B by PCR: NEGATIVE
Resp Syncytial Virus by PCR: NEGATIVE
SARS Coronavirus 2 by RT PCR: NEGATIVE

## 2023-01-02 LAB — LACTIC ACID, PLASMA: Lactic Acid, Venous: >9 mmol/L (ref 0.5–1.9)

## 2023-01-02 MED ORDER — MIDAZOLAM HCL 2 MG/2ML IJ SOLN: 2.0000 mg | INTRAMUSCULAR | Status: DC | PRN

## 2023-01-02 MED ORDER — SODIUM CHLORIDE 0.9 % IV SOLN: INTRAVENOUS | Status: DC

## 2023-01-02 MED ORDER — MORPHINE BOLUS VIA INFUSION: 5.0000 mg | INTRAVENOUS | Status: DC | PRN

## 2023-01-02 MED ORDER — SODIUM CHLORIDE 0.9 % IV SOLN
2.0000 g | Freq: Once | INTRAVENOUS | Status: AC
  Administered 2023-01-02: 2 g via INTRAVENOUS
  Filled 2023-01-02: qty 12.5

## 2023-01-02 MED ORDER — LACTATED RINGERS IV BOLUS
1000.0000 mL | Freq: Once | INTRAVENOUS | Status: AC
  Administered 2023-01-02: 1000 mL via INTRAVENOUS

## 2023-01-02 MED ORDER — POLYVINYL ALCOHOL 1.4 % OP SOLN: 1.0000 [drp] | Freq: Four times a day (QID) | OPHTHALMIC | Status: DC | PRN

## 2023-01-02 MED ORDER — ACETAMINOPHEN 650 MG RE SUPP: 650.0000 mg | Freq: Four times a day (QID) | RECTAL | Status: DC | PRN

## 2023-01-02 MED ORDER — ACETAMINOPHEN 325 MG PO TABS: 650.0000 mg | ORAL_TABLET | Freq: Four times a day (QID) | ORAL | Status: DC | PRN

## 2023-01-02 MED ORDER — LACTATED RINGERS IV SOLN: INTRAVENOUS | Status: DC

## 2023-01-02 MED ORDER — SODIUM CHLORIDE 0.9 % IV SOLN: 100.0000 mg | Freq: Two times a day (BID) | INTRAVENOUS | Status: DC

## 2023-01-02 MED ORDER — VANCOMYCIN HCL 1250 MG/250ML IV SOLN
1250.0000 mg | Freq: Once | INTRAVENOUS | Status: AC
  Administered 2023-01-02: 1250 mg via INTRAVENOUS
  Filled 2023-01-02: qty 250

## 2023-01-02 MED ORDER — MORPHINE 100MG IN NS 100ML (1MG/ML) PREMIX INFUSION
0.0000 mg/h | INTRAVENOUS | Status: DC
  Administered 2023-01-02: 5 mg/h via INTRAVENOUS
  Filled 2023-01-02: qty 100

## 2023-01-02 MED ORDER — HALOPERIDOL LACTATE 5 MG/ML IJ SOLN: 2.5000 mg | INTRAMUSCULAR | Status: DC | PRN

## 2023-01-02 MED ORDER — ONDANSETRON 4 MG PO TBDP: 4.0000 mg | ORAL_TABLET | Freq: Four times a day (QID) | ORAL | Status: DC | PRN

## 2023-01-02 MED ORDER — LACTATED RINGERS IV BOLUS (SEPSIS)
1000.0000 mL | Freq: Once | INTRAVENOUS | Status: AC
  Administered 2023-01-02: 1000 mL via INTRAVENOUS

## 2023-01-02 MED ORDER — GLYCOPYRROLATE 1 MG PO TABS: 1.0000 mg | ORAL_TABLET | ORAL | Status: DC | PRN

## 2023-01-02 MED ORDER — METRONIDAZOLE 500 MG/100ML IV SOLN
500.0000 mg | Freq: Once | INTRAVENOUS | Status: AC
  Administered 2023-01-02: 500 mg via INTRAVENOUS
  Filled 2023-01-02: qty 100

## 2023-01-02 MED ORDER — HALOPERIDOL LACTATE 5 MG/ML IJ SOLN
5.0000 mg | Freq: Once | INTRAMUSCULAR | Status: AC
  Administered 2023-01-02: 5 mg via INTRAMUSCULAR
  Filled 2023-01-02: qty 1

## 2023-01-02 MED ORDER — VANCOMYCIN HCL IN DEXTROSE 1-5 GM/200ML-% IV SOLN: 1000.0000 mg | Freq: Once | INTRAVENOUS | Status: DC

## 2023-01-02 MED ORDER — LORAZEPAM 2 MG/ML IJ SOLN: 0.5000 mg | Freq: Four times a day (QID) | INTRAMUSCULAR | Status: DC | PRN

## 2023-01-02 MED ORDER — ONDANSETRON HCL 4 MG/2ML IJ SOLN
4.0000 mg | Freq: Four times a day (QID) | INTRAMUSCULAR | Status: DC | PRN
  Administered 2023-01-02: 4 mg via INTRAVENOUS
  Filled 2023-01-02: qty 2

## 2023-01-02 MED ORDER — GLYCOPYRROLATE 0.2 MG/ML IJ SOLN: 0.2000 mg | INTRAMUSCULAR | Status: DC | PRN

## 2023-01-02 MED ORDER — ALBUTEROL SULFATE (2.5 MG/3ML) 0.083% IN NEBU: 2.5000 mg | INHALATION_SOLUTION | RESPIRATORY_TRACT | Status: DC | PRN

## 2023-01-18 NOTE — Progress Notes (Signed)
Time of death: 21:48

## 2023-01-18 NOTE — ED Notes (Signed)
Attempted IV insertion and draw blood cultures/lactic acid without success. Patient is agitated.

## 2023-01-18 NOTE — ED Notes (Signed)
In & out patient unable to obtain any urine at this time.

## 2023-01-18 NOTE — ED Notes (Signed)
MD made aware of low SBP. No s/s of any distress. Will continue to monitor

## 2023-01-18 NOTE — H&P (Addendum)
PCP:   Mayra Neer, MD   Chief Complaint:  Nausea and vomiting  HPI: This is a 89 year old female with advanced dementia with behavioral disturbances, CKD, atrial fibrillation, MDD, history of PE/DVT, diabetes mellitus type 2.  Today patient developed nausea and vomiting, was not feeling well.  Her daughter went to her home 2 doors down to have a bath.  When she came back 1 hour later, she found her mom laying on the floor next to her bed.  When she went to assist her mama, the patient had a bad bout of nausea and vomiting.  Patient brought to the ER. In the ER, patient persistently hypotensive despite fluid resuscitation.  Chest x-ray positive for pneumonia, lactic acid greater than 9.  Patient given sepsis protocol IV bolus.  Detailed discussion had with family by EDP.  Initially had decided no ICU escalation of care, no pressors.  Treat with antibiotics and IV fluids.  If patient status worsens, they were amenable to adjusting expectations.  Patient blood pressure did rebound briefly, then again patient's blood pressure went to 69/52.  The family was again approached, they decided to admit the patient comfort care.  Patient's daughter son and grandson were present at bedside.  Review of Systems:  Unable to obtain due to acuity of patient's condition  Past Medical History: Past Medical History:  Diagnosis Date   A-fib [facility])    Atrial flutter ([place]) 01/14/2017   Chronic kidney disease    Diabetes mellitus type II, non insulin dependent ([place]) 01/14/2017   DVT of lower extremity, bilateral ([place]): peroneal veins 01/15/2017   Hypertension    Major depressive disorder with psychotic features ([place])    Major neurocognitive disorder ([place])    Pulmonary embolus ([place]) 01/13/2017   Past Surgical History:  Procedure Laterality Date   CHOLECYSTECTOMY      Medications: Prior to Admission medications   Medication Sig Start Date End Date Taking? Authorizing Provider  cholecalciferol (VITAMIN D3) 25  MCG (1000 UNIT) tablet Take 1,000 Units by mouth at bedtime.   Yes [provider]  loperamide (IMODIUM) 2 MG capsule Take 2 mg by mouth daily.   Yes [provider]  metoprolol succinate (TOPROL-XL) 25 MG 24 hr tablet TAKE ONE-HALF (1/2) TABLET TWICE A DAY Patient taking differently: Take 12.5 mg by mouth 2 (two) times daily. TAKE ONE-HALF (1/2) TABLET TWICE A DAY 08/15/22  Yes Monge, Helane Gunther, NP  risperiDONE (RISPERDAL) 0.5 MG tablet Take 0.5 mg by mouth at bedtime. 02/13/20  Yes [provider]  rivaroxaban (XARELTO) 20 MG TABS tablet Take 1 tablet (20 mg total) by mouth daily with supper. 08/15/22  Yes Monge, Helane Gunther, NP  vitamin B-12 (CYANOCOBALAMIN) 1000 MCG tablet Take 1,000 mcg by mouth daily.   Yes [provider]  vitamin C (ASCORBIC ACID) 500 MG tablet Take 500 mg by mouth daily.   Yes [provider]    Allergies:   Allergies  Allergen Reactions   Fluoxetine     Other reaction(s): sleepiness    Social History:  reports that she has never smoked. She has never used smokeless tobacco. She reports that she does not drink alcohol and does not use drugs.  Family History: Family History  Problem Relation Age of Onset   Deep vein thrombosis Neg Hx     Physical Exam: Vitals:   2023-01-25 1730 01-25-23 1742 01-25-23 1743 01-25-2023 1815  BP: (!) 137/98   106/64  Pulse: (!) 115 (!) 125 (!) 122 Marland Kitchen)  105  Resp: (!) 23 20 (!) 24 (!) 25  Temp:      TempSrc:      SpO2: 94% 92% 98% 94%    General:  Alert, demented.  Ill appearing patient Eyes: PERRLA, pink conjunctiva, no scleral icterus ENT: Moist oral mucosa, neck supple, no thyromegaly Lungs: clear to ascultation, no wheeze, no crackles, no use of accessory muscles Cardiovascular: regular rate and rhythm, no regurgitation, no gallops, no murmurs. No carotid bruits, no JVD Abdomen: soft, positive BS, non-tender, non-distended, no organomegaly, not an acute abdomen GU: not examined Neuro:  CN II - XII grossly intact, sensation intact Musculoskeletal: strength 5/5 all extremities, no clubbing, cyanosis or edema Skin: no rash, no subcutaneous crepitation, no decubitus Psych: Patient   Labs on Admission:  Recent Labs    2023/01/14 1804  NA 145  K 4.0  CL 109  CO2 17*  GLUCOSE 93  BUN 21  CREATININE 1.76*  CALCIUM 8.0*    Recent Labs    01-14-23 1329  WBC 19.2*  HGB 15.9*  HCT 51.8*  MCV 100.6*  PLT 255     Micro Results: Recent Results (from the past 240 hour(s))  Resp panel by RT-PCR (RSV, Flu A&B, Covid) Anterior Nasal Swab     Status: None   Collection Time: January 14, 2023  3:13 PM   Specimen: Anterior Nasal Swab  Result Value Ref Range Status   SARS Coronavirus 2 by RT PCR NEGATIVE NEGATIVE Final    Comment: (NOTE) SARS-CoV-2 target nucleic acids are NOT DETECTED.  The SARS-CoV-2 RNA is generally detectable in upper respiratory specimens during the acute phase of infection. The lowest concentration of SARS-CoV-2 viral copies this assay can detect is 138 copies/mL. A negative result does not preclude SARS-Cov-2 infection and should not be used as the sole basis for treatment or other patient management decisions. A negative result may occur with  improper specimen collection/handling, submission of specimen other than nasopharyngeal swab, presence of viral mutation(s) within the areas targeted by this assay, and inadequate number of viral copies(<138 copies/mL). A negative result must be combined with clinical observations, patient history, and epidemiological information. The expected result is Negative.  Fact Sheet for Patients:  EntrepreneurPulse.com.au  Fact Sheet for Healthcare Providers:  IncredibleEmployment.be  This test is no t yet approved or cleared by the [place] FDA and  has been authorized for detection and/or diagnosis of SARS-CoV-2 by FDA under an Emergency Use Authorization (EUA). This EUA  will remain  in effect (meaning this test can be used) for the duration of the COVID-19 declaration under Section 564(b)(1) of the Act, 21 U.S.C.section 360bbb-3(b)(1), unless the authorization is terminated  or revoked sooner.       Influenza A by PCR NEGATIVE NEGATIVE Final   Influenza B by PCR NEGATIVE NEGATIVE Final    Comment: (NOTE) The Xpert Xpress SARS-CoV-2/FLU/RSV plus assay is intended as an aid in the diagnosis of influenza from Nasopharyngeal swab specimens and should not be used as a sole basis for treatment. Nasal washings and aspirates are unacceptable for Xpert Xpress SARS-CoV-2/FLU/RSV testing.  Fact Sheet for Patients: EntrepreneurPulse.com.au  Fact Sheet for Healthcare Providers: IncredibleEmployment.be  This test is not yet approved or cleared by the [place] FDA and has been authorized for detection and/or diagnosis of SARS-CoV-2 by FDA under an Emergency Use Authorization (EUA). This EUA will remain in effect (meaning this test can be used) for the duration of the COVID-19 declaration under Section 564(b)(1) of the Act,  21 U.S.C. section 360bbb-3(b)(1), unless the authorization is terminated or revoked.     Resp Syncytial Virus by PCR NEGATIVE NEGATIVE Final    Comment: (NOTE) Fact Sheet for Patients: EntrepreneurPulse.com.au  Fact Sheet for Healthcare Providers: IncredibleEmployment.be  This test is not yet approved or cleared by the [place] FDA and has been authorized for detection and/or diagnosis of SARS-CoV-2 by FDA under an Emergency Use Authorization (EUA). This EUA will remain in effect (meaning this test can be used) for the duration of the COVID-19 declaration under Section 564(b)(1) of the Act, 21 U.S.C. section 360bbb-3(b)(1), unless the authorization is terminated or revoked.  Performed at [facility], [place] [address] [place], [place] [address]       Radiological Exams on Admission: DG Chest Port 1 View  Result Date: 01/13/2023 CLINICAL DATA:  Weakness.  Hypotension. EXAM: PORTABLE CHEST 1 VIEW COMPARISON:  07/11/2018 FINDINGS: The cardiac silhouette, mediastinal and hilar contours are within normal limits given the AP projection and portable technique. Right lower lobe airspace process consistent with pneumonia. There is also a small right parapneumonic effusion. The left lung is clear. The bony thorax is intact. IMPRESSION: Right lower lobe pneumonia and small parapneumonic effusion. Electronically Signed   By: Marijo Sanes M.D.   On: 2023/01/13 15:37    Assessment/Plan Present on Admission: Septic Shock due to pneumonia(HCC)  Lactic acid acidosis Comfort care -Admit for comfort care -After discussion with family,a decision for comfort care was made -No telemonitoring, IV fluids, vitals -Comfort care order set initiated with morphine drip continuous drip, Robinul, IV Ativan  Severe dementia with behavioral disturbance  Hypertension Atrial fibrillation CKD stage 4 H/o DVT/PE  Major depressive disorder with psychotic features ([place])   Kimeka Badour Jan 13, 2023, 8:11 PM

## 2023-01-18 NOTE — ED Notes (Signed)
Time of death 2148:13

## 2023-01-18 NOTE — Sepsis Progress Note (Signed)
Elink following code sepsis

## 2023-01-18 NOTE — ED Provider Notes (Signed)
89 yo female here w/ dementia here with diarrhea for several weeks, n/v the past few days, abdominal pain  Hypotensive here in Ed, BP 80's/50's  Pending sepsis w/u, WBC 19.2, fluid bolus  Patient is DNR/DNI - no heroic measures  Physical Exam  BP 106/64   Pulse (!) 105   Temp (!) 97.4 F (36.3 C) (Axillary)   Resp (!) 25   SpO2 94%   Physical Exam  Procedures  Procedures  ED Course / MDM    Medical Decision Making Amount and/or Complexity of Data Reviewed Labs: ordered. Radiology: ordered. ECG/medicine tests: ordered.  Risk Prescription drug management. Decision regarding hospitalization.   Patient is persistent hypertension despite fluid resuscitation.  I spoke to her daughter who is her power of attorney outside the room, and in keeping with the patient's wishes, I confirmed that the patient is DNR/DNI, and is also not wanting any heroic measures or life support, including vasopressors, and therefore no ICU admission.  Explained my concerns the patient may be showing signs of shock with her low blood pressure, although mentally she remains cognizant and quite feisty in the room.  The patient is refusing any further blood draws and is combative which her daughter says unfortunately is her baseline with her aggressive dementia and psychosis.  I have discussed sedation for the purposes of obtaining at least baseline labs, as her CMP had hemolyzed.  Her daughter is in agreement with this.  IV Haldol ordered.  The patient will be admitted to the hospital subsequently for IV fluids and antibiotics.    If the patient were to demonstrate continued signs of shock or sepsis, the family would like to be updated, and would likely pivot to comfort care measures at that time, according to her daughter.  *  Repeat labs were successful to obtain BMP, mild AKI, small anion gap likely related to dehydration.  Patient is on appropriate antibiotics for pneumonia.  Plan to admit to  hospitalist.  *  Lactate returned significantly elevated.  This is extremely concerning -unclear if this is sepsis or alternative etiology, but raises concern for developing shock.  Subsequently around 7 to 8 PM I was notified that the patient's blood pressure was extremely low again.  She now looks lethargic on exam.  I was able to speak to the patient's son, daughter, and grandson, who are made aware of her critical illness, and the likelihood that she will die from this illness.  They are wishing to transition her to comfort care.  They cite her general poor quality of life for the past several years, and the wish to not have the patient linger at the end of life in hospitals and on critical care.  I also relayed this message to the hospitalist Dr. Claria Dice who had admitted the patient.      Wyvonnia Dusky, MD 01/22/23 2020

## 2023-01-18 NOTE — Sepsis Progress Note (Signed)
See documentation in ED Timeline re: inability to obtain lactic acid and blood cultures. Abx given

## 2023-01-18 NOTE — ED Notes (Signed)
Patient placement notified, spoke to [facility]

## 2023-01-18 NOTE — ED Triage Notes (Signed)
Patient BIB EMS from home with c/o hypotensive SBP 60, N/V/D for a few days. H/O Dementia. 110/70 with 61m IVFSkin tear to right elbow. 20RAC

## 2023-01-18 NOTE — Progress Notes (Signed)
   01/05/23 1004  Spiritual Encounters  Type of Visit Initial  Care provided to: Pt and family  Referral source Nurse (RN/NT/LPN)  Reason for visit Patient death  OnCall Visit Yes  Interventions  Spiritual Care Interventions Made Compassionate presence;Reflective listening;Bereavement/grief support;Prayer   Chaplain responded to page and provided support to family who had experienced. Patient's adult children (brother and sister) and 2 granddaughters were present at bedside. Chaplain provided reflective listening, sacred text, and prayer.   Melody Haver, Resident Chaplain [phone]

## 2023-01-18 NOTE — ED Provider Notes (Signed)
[facility] EMERGENCY DEPARTMENT Provider Note   CSN: [id number] Arrival date & time: 2023/01/05  1249     History  Chief Complaint  Patient presents with   Hypotension    Catherine Gonzales is a 89 y.o. female.  Pt with recent generalized weakness, and blood pressure reported low at home. EMS notes pt with recent vomiting and diarrhea in past couple days, initial bp 60/ but improved with 600 cc fluids to 110/70.  Pt w hx advanced dementia - poor historian - level 5 caveat. No report of trauma/fall. No report of fevers. Daughter subsequently arrived, indicates pt with intermittent diarrhea in past month, and that in past 1-2 days with a few episodes emesis (brownish) and had c/o abd pain.   The history is provided by the patient, the EMS personnel, medical records and a relative. The history is limited by the condition of the patient.       Home Medications Prior to Admission medications   Medication Sig Start Date End Date Taking? Authorizing Provider  atorvastatin (LIPITOR) 20 MG tablet Take 1 tablet (20 mg total) by mouth daily at 6 PM. 01/20/17   [name], MD  Cholecalciferol (VITAMIN D) 125 MCG (5000 UT) CAPS Take 1 tablet by mouth once a week.    [provider]  magnesium 30 MG tablet Take 250 mg by mouth daily.    [provider]  metoprolol succinate (TOPROL-XL) 25 MG 24 hr tablet TAKE ONE-HALF (1/2) TABLET TWICE A DAY 08/15/22   Lenna Sciara, NP  risperiDONE (RISPERDAL) 0.5 MG tablet  02/13/20   [provider]  rivaroxaban (XARELTO) 20 MG TABS tablet Take 1 tablet (20 mg total) by mouth daily with supper. 08/15/22   Lenna Sciara, NP  vitamin B-12 (CYANOCOBALAMIN) 1000 MCG tablet Take 1,000 mcg by mouth daily.    [provider]  vitamin C (ASCORBIC ACID) 500 MG tablet 1 tablet    [provider]      Allergies    Fluoxetine    Review of Systems   Review of Systems  Unable to perform ROS: Dementia     Physical Exam Updated Vital Signs BP (!) 81/17   Pulse (!) 51   Temp (!) 97.3 F (36.3 C) (Oral)   Resp (!) 22   SpO2 99%  Physical Exam Vitals and nursing note reviewed.  Constitutional:      Appearance: Normal appearance. She is well-developed.  HENT:     Head: Atraumatic.     Nose: Nose normal.     Mouth/Throat:     Mouth: Mucous membranes are moist.  Eyes:     General: No scleral icterus.    Conjunctiva/sclera: Conjunctivae normal.     Pupils: Pupils are equal, round, and reactive to light.  Neck:     Trachea: No tracheal deviation.  Cardiovascular:     Rate and Rhythm: Normal rate and regular rhythm.     Pulses: Normal pulses.     Heart sounds: Normal heart sounds. No murmur heard.    No friction rub. No gallop.  Pulmonary:     Effort: Pulmonary effort is normal. No respiratory distress.     Breath sounds: Normal breath sounds.  Abdominal:     General: Bowel sounds are normal. There is no distension.     Palpations: Abdomen is soft.     Tenderness: There is abdominal tenderness. There is no guarding.     Comments: Mild mid abd  tenderness.   Genitourinary:    Comments: No cva tenderness.  Musculoskeletal:        General: No swelling or tenderness.     Cervical back: Normal range of motion and neck supple. No rigidity. No muscular tenderness.  Skin:    General: Skin is warm and dry.     Findings: No rash.  Neurological:     Mental Status: She is alert.     Comments: Alert, speech not grossly dysarthric.  Motor/sens grossly intact bil.   Psychiatric:        Mood and Affect: Mood normal.     ED Results / Procedures / Treatments   Labs (all labs ordered are listed, but only abnormal results are displayed) Results for orders placed or performed during the hospital encounter of 01/14/2023  CBC  Result Value Ref Range   WBC 19.2 (H) 4.0 - 10.5 K/uL   RBC 5.15 (H) 3.87 - 5.11 MIL/uL   Hemoglobin 15.9 (H) 12.0 - 15.0 g/dL   HCT 51.8 (H) 36.0 - 46.0 %   MCV  100.6 (H) 80.0 - 100.0 fL   MCH 30.9 26.0 - 34.0 pg   MCHC 30.7 30.0 - 36.0 g/dL   RDW 14.3 11.5 - 15.5 %   Platelets 255 150 - 400 K/uL   nRBC 0.0 0.0 - 0.2 %     EKG EKG Interpretation  Date/Time:  2023/01/14 13:06:44 EST Ventricular Rate:  100 PR Interval:  151 QRS Duration: 83 QT Interval:  346 QTC Calculation: 447 R Axis:   2 Text Interpretation: Sinus tachycardia with irregular rate Premature atrial complexes Non-specific ST-t changes Confirmed by Lajean Saver [phone]) on 2023-01-14 3:11:19 PM  Radiology No results found.  Procedures Procedures    Medications Ordered in ED Medications  lactated ringers bolus 1,000 mL (has no administration in time range)  lactated ringers bolus 1,000 mL (0 mLs Intravenous Stopped 01/14/23 1420)    ED Course/ Medical Decision Making/ A&P                             Medical Decision Making Problems Addressed: Dehydration: acute illness or injury with systemic symptoms that poses a threat to life or bodily functions Generalized weakness: acute illness or injury with systemic symptoms that poses a threat to life or bodily functions Nausea vomiting and diarrhea: acute illness or injury with systemic symptoms that poses a threat to life or bodily functions Other specified hypotension: acute illness or injury with systemic symptoms that poses a threat to life or bodily functions  Amount and/or Complexity of Data Reviewed Independent Historian: EMS    Details: Ems/family, hx External Data Reviewed: notes. Labs: ordered. Decision-making details documented in ED Course. Radiology: ordered and independent interpretation performed. Decision-making details documented in ED Course. ECG/medicine tests: ordered and independent interpretation performed. Decision-making details documented in ED Course. Discussion of management or test interpretation with external provider(s): medicine  Risk Prescription drug management. Decision  regarding hospitalization.   Iv ns. Continuous pulse ox and cardiac monitoring. Labs ordered/sent. Imaging ordered.   Differential diagnosis includes dehydration, aki, uti, etc . Dispo decision including potential need for admission considered - will get labs and imaging and reassess.   Reviewed nursing notes and prior charts for additional history. External reports reviewed. Additional history from: EMS.   Cardiac monitor: sinus rhythm, rate 80.  LR bolus iv.   Labs reviewed/interpreted by me - wbc is high.  Bp which had improved w fluids is recurrently low. Lactate and cultures added to workup. Mild abd tenderness on exam. Give hypotension, while labs/cultures pending, will give iv abx, get ct abd/pelvis.    Studies pending. Signed out to 3 pm orange doc to check pending studies, recheck post ivf boluses, and dispo appropriately (anticipate patient will require admission).  Daughter confirms pt and family wishes, no cpr, no defib, no intubation/vent, but ok w ivf, meds, etc.   CRITICAL CARE RE: hypotension, volume depletion, nausea/vomiting/diarrhea, r/o abd emergency Performed by: Mirna Mires Total critical care time: 45 minutes Critical care time was exclusive of separately billable procedures and treating other patients. Critical care was necessary to treat or prevent imminent or life-threatening deterioration. Critical care was time spent personally by me on the following activities: development of treatment plan with patient and/or surrogate as well as nursing, discussions with consultants, evaluation of patient's response to treatment, examination of patient, obtaining history from patient or surrogate, ordering and performing treatments and interventions, ordering and review of laboratory studies, ordering and review of radiographic studies, pulse oximetry and re-evaluation of patient's condition.        Final Clinical Impression(s) / ED Diagnoses Final diagnoses:  None     Rx / DC Orders ED Discharge Orders     None         Lajean Saver, MD 2023-01-22 1529

## 2023-01-18 NOTE — ED Notes (Signed)
Not able to draw blood culture prior to antibiotics start

## 2023-01-18 NOTE — Death Summary Note (Addendum)
Expiration Note  Catherine Gonzales  MR#: [id number]  DOB:07/19/1934  Date of Admission: 01/08/2023 Date of Death: 08-Jan-2023  Attending Physician:Cay Kath  Patient's PCP: Mayra Neer, MD  Consults: none  Cause of Death: Sepsis due to pneumonia [facility])  Secondary Diagnoses Present on Admission:  Septic Shock due to pneumonia(HCC)  Lactic acid acidosis  Hypertension  Major depressive disorder with psychotic features [facility])     Hospital Course: This is a 89 year old female with advanced dementia with behavioral disturbances, CKD stage 4, atrial fibrillation, MDD, history of PE/DVT, diabetes mellitus type 2.  Today patient developed nausea and vomiting, was not feeling well.  Her daughter went to her home 2 doors down to have a bath.  When she came back 1 hour later, she found her mom laying on the floor next to her bed.  When she went to assist her mama, the patient had a bad bout of nausea and vomiting.  Patient brought to the ER. In the ER, patient persistently hypotensive despite fluid resuscitation.  Chest x-ray positive for pneumonia, lactic acid greater than 9.  Patient given sepsis protocol IV bolus.  Detailed discussion had with family by EDP.  Initially had decided no ICU escalation of care, no pressors.  Treat with antibiotics and IV fluids.  If patient status worsens, they were amenable to adjusting expectations.  Patient blood pressure did rebound briefly, then again patient's blood pressure went to 69/52.  The family was again approached, they decided to admit the patient comfort care.  Patient's daughter son and grandson were present at bedside. Patient passed in ER at 21:48  (9:48PM).   Signed: Deatra Mcmahen January 08, 2023, 10:00 PM

## 2023-01-18 DEATH — deceased
